# Patient Record
Sex: Male | Born: 1960
Health system: Southern US, Community
[De-identification: ages and names within clinical notes are randomized; demographics above are authoritative.]

## PROBLEM LIST (undated history)

## (undated) DIAGNOSIS — D369 Benign neoplasm, unspecified site: Secondary | ICD-10-CM

## (undated) DIAGNOSIS — M539 Dorsopathy, unspecified: Secondary | ICD-10-CM

## (undated) DIAGNOSIS — A048 Other specified bacterial intestinal infections: Secondary | ICD-10-CM

## (undated) DIAGNOSIS — Q600 Renal agenesis, unilateral: Secondary | ICD-10-CM

## (undated) DIAGNOSIS — K219 Gastro-esophageal reflux disease without esophagitis: Secondary | ICD-10-CM

## (undated) DIAGNOSIS — N529 Male erectile dysfunction, unspecified: Secondary | ICD-10-CM

## (undated) DIAGNOSIS — G4730 Sleep apnea, unspecified: Secondary | ICD-10-CM

## (undated) DIAGNOSIS — E7800 Pure hypercholesterolemia, unspecified: Secondary | ICD-10-CM

## (undated) DIAGNOSIS — N400 Enlarged prostate without lower urinary tract symptoms: Secondary | ICD-10-CM

## (undated) DIAGNOSIS — K5790 Diverticulosis of intestine, part unspecified, without perforation or abscess without bleeding: Secondary | ICD-10-CM

## (undated) DIAGNOSIS — M1990 Unspecified osteoarthritis, unspecified site: Secondary | ICD-10-CM

## (undated) DIAGNOSIS — F419 Anxiety disorder, unspecified: Secondary | ICD-10-CM

## (undated) DIAGNOSIS — R0600 Dyspnea, unspecified: Secondary | ICD-10-CM

## (undated) DIAGNOSIS — K851 Biliary acute pancreatitis without necrosis or infection: Secondary | ICD-10-CM

## (undated) DIAGNOSIS — E785 Hyperlipidemia, unspecified: Secondary | ICD-10-CM

## (undated) DIAGNOSIS — K869 Disease of pancreas, unspecified: Secondary | ICD-10-CM

## (undated) DIAGNOSIS — F32A Depression, unspecified: Secondary | ICD-10-CM

## (undated) DIAGNOSIS — J189 Pneumonia, unspecified organism: Secondary | ICD-10-CM

## (undated) DIAGNOSIS — Z8371 Family history of colonic polyps: Secondary | ICD-10-CM

## (undated) DIAGNOSIS — I10 Essential (primary) hypertension: Secondary | ICD-10-CM

## (undated) HISTORY — DX: Benign prostatic hyperplasia without lower urinary tract symptoms: N40.0

## (undated) HISTORY — DX: Other specified bacterial intestinal infections: A04.8

## (undated) HISTORY — DX: Renal agenesis, unilateral: Q60.0

## (undated) HISTORY — DX: Benign neoplasm, unspecified site: D36.9

## (undated) HISTORY — DX: Hyperlipidemia, unspecified: E78.5

## (undated) HISTORY — DX: Gastro-esophageal reflux disease without esophagitis: K21.9

## (undated) HISTORY — PX: KNEE SURGERY: SHX244

## (undated) HISTORY — DX: Dorsopathy, unspecified: M53.9

## (undated) HISTORY — DX: Disease of pancreas, unspecified: K86.9

## (undated) HISTORY — PX: BACK SURGERY: SHX140

## (undated) HISTORY — PX: PANCREAS SURGERY: SHX731

## (undated) HISTORY — PX: OTHER SURGICAL HISTORY: SHX169

## (undated) HISTORY — DX: Male erectile dysfunction, unspecified: N52.9

## (undated) HISTORY — PX: FOOT SURGERY: SHX648

## (undated) HISTORY — DX: Diverticulosis of intestine, part unspecified, without perforation or abscess without bleeding: K57.90

## (undated) HISTORY — PX: CARPAL TUNNEL RELEASE: SHX101

## (undated) HISTORY — DX: Biliary acute pancreatitis without necrosis or infection: K85.10

## (undated) HISTORY — PX: SPINE SURGERY: SHX786

---

## 1996-11-04 DIAGNOSIS — A048 Other specified bacterial intestinal infections: Secondary | ICD-10-CM

## 1996-11-04 HISTORY — DX: Other specified bacterial intestinal infections: A04.8

## 2000-08-25 ENCOUNTER — Ambulatory Visit (HOSPITAL_COMMUNITY): Admission: RE | Admit: 2000-08-25 | Discharge: 2000-08-25 | Payer: Self-pay | Admitting: Family Medicine

## 2000-08-25 ENCOUNTER — Encounter: Payer: Self-pay | Admitting: Family Medicine

## 2001-04-27 ENCOUNTER — Encounter: Payer: Self-pay | Admitting: Family Medicine

## 2001-04-27 ENCOUNTER — Ambulatory Visit (HOSPITAL_COMMUNITY): Admission: RE | Admit: 2001-04-27 | Discharge: 2001-04-27 | Payer: Self-pay | Admitting: Family Medicine

## 2001-04-29 ENCOUNTER — Encounter: Payer: Self-pay | Admitting: Family Medicine

## 2001-04-29 ENCOUNTER — Inpatient Hospital Stay (HOSPITAL_COMMUNITY): Admission: RE | Admit: 2001-04-29 | Discharge: 2001-05-03 | Payer: Self-pay | Admitting: Family Medicine

## 2001-05-02 ENCOUNTER — Encounter: Payer: Self-pay | Admitting: Internal Medicine

## 2001-05-13 ENCOUNTER — Ambulatory Visit (HOSPITAL_COMMUNITY): Admission: RE | Admit: 2001-05-13 | Discharge: 2001-05-13 | Payer: Self-pay | Admitting: Pulmonary Disease

## 2001-05-13 ENCOUNTER — Ambulatory Visit (HOSPITAL_COMMUNITY): Admission: RE | Admit: 2001-05-13 | Discharge: 2001-05-13 | Payer: Self-pay | Admitting: Family Medicine

## 2001-05-20 ENCOUNTER — Encounter: Payer: Self-pay | Admitting: General Surgery

## 2001-05-20 ENCOUNTER — Ambulatory Visit (HOSPITAL_COMMUNITY): Admission: RE | Admit: 2001-05-20 | Discharge: 2001-05-20 | Payer: Self-pay | Admitting: Pediatrics

## 2001-05-26 ENCOUNTER — Inpatient Hospital Stay (HOSPITAL_COMMUNITY): Admission: RE | Admit: 2001-05-26 | Discharge: 2001-05-31 | Payer: Self-pay | Admitting: General Surgery

## 2001-05-29 ENCOUNTER — Encounter: Payer: Self-pay | Admitting: General Surgery

## 2001-05-30 ENCOUNTER — Encounter: Payer: Self-pay | Admitting: General Surgery

## 2002-03-16 ENCOUNTER — Ambulatory Visit (HOSPITAL_COMMUNITY): Admission: RE | Admit: 2002-03-16 | Discharge: 2002-03-16 | Payer: Self-pay | Admitting: Internal Medicine

## 2002-03-16 ENCOUNTER — Encounter (INDEPENDENT_AMBULATORY_CARE_PROVIDER_SITE_OTHER): Payer: Self-pay | Admitting: Internal Medicine

## 2002-05-24 ENCOUNTER — Ambulatory Visit (HOSPITAL_COMMUNITY): Admission: RE | Admit: 2002-05-24 | Discharge: 2002-05-24 | Payer: Self-pay | Admitting: Pulmonary Disease

## 2003-01-01 ENCOUNTER — Encounter: Payer: Self-pay | Admitting: Family Medicine

## 2003-01-01 ENCOUNTER — Ambulatory Visit (HOSPITAL_COMMUNITY): Admission: RE | Admit: 2003-01-01 | Discharge: 2003-01-01 | Payer: Self-pay | Admitting: Family Medicine

## 2003-02-20 ENCOUNTER — Ambulatory Visit (HOSPITAL_COMMUNITY): Admission: RE | Admit: 2003-02-20 | Discharge: 2003-02-20 | Payer: Self-pay | Admitting: Family Medicine

## 2003-03-13 ENCOUNTER — Ambulatory Visit (HOSPITAL_COMMUNITY): Admission: RE | Admit: 2003-03-13 | Discharge: 2003-03-14 | Payer: Self-pay | Admitting: Neurosurgery

## 2003-05-16 ENCOUNTER — Encounter (HOSPITAL_COMMUNITY): Admission: RE | Admit: 2003-05-16 | Discharge: 2003-06-15 | Payer: Self-pay | Admitting: Neurosurgery

## 2003-06-14 ENCOUNTER — Encounter (HOSPITAL_COMMUNITY): Admission: RE | Admit: 2003-06-14 | Discharge: 2003-07-14 | Payer: Self-pay | Admitting: Neurosurgery

## 2003-07-16 ENCOUNTER — Ambulatory Visit (HOSPITAL_COMMUNITY): Admission: RE | Admit: 2003-07-16 | Discharge: 2003-07-16 | Payer: Self-pay | Admitting: Neurosurgery

## 2004-12-02 ENCOUNTER — Ambulatory Visit (HOSPITAL_COMMUNITY): Admission: RE | Admit: 2004-12-02 | Discharge: 2004-12-02 | Payer: Self-pay | Admitting: Family Medicine

## 2004-12-02 ENCOUNTER — Ambulatory Visit (HOSPITAL_COMMUNITY): Admission: RE | Admit: 2004-12-02 | Discharge: 2004-12-02 | Payer: Self-pay

## 2004-12-09 ENCOUNTER — Ambulatory Visit (HOSPITAL_COMMUNITY): Admission: RE | Admit: 2004-12-09 | Discharge: 2004-12-09 | Payer: Self-pay | Admitting: Family Medicine

## 2004-12-16 ENCOUNTER — Ambulatory Visit: Payer: Self-pay | Admitting: *Deleted

## 2004-12-26 ENCOUNTER — Encounter (HOSPITAL_COMMUNITY): Admission: RE | Admit: 2004-12-26 | Discharge: 2004-12-26 | Payer: Self-pay | Admitting: *Deleted

## 2004-12-26 ENCOUNTER — Ambulatory Visit: Payer: Self-pay | Admitting: Cardiology

## 2004-12-26 ENCOUNTER — Emergency Department (HOSPITAL_COMMUNITY): Admission: EM | Admit: 2004-12-26 | Discharge: 2004-12-26 | Payer: Self-pay | Admitting: Emergency Medicine

## 2004-12-31 ENCOUNTER — Ambulatory Visit: Payer: Self-pay | Admitting: *Deleted

## 2005-01-02 ENCOUNTER — Ambulatory Visit: Payer: Self-pay | Admitting: Critical Care Medicine

## 2005-01-30 ENCOUNTER — Ambulatory Visit: Payer: Self-pay | Admitting: Critical Care Medicine

## 2005-02-11 ENCOUNTER — Ambulatory Visit (HOSPITAL_COMMUNITY): Admission: RE | Admit: 2005-02-11 | Discharge: 2005-02-11 | Payer: Self-pay | Admitting: Family Medicine

## 2005-02-25 ENCOUNTER — Encounter: Admission: RE | Admit: 2005-02-25 | Discharge: 2005-02-25 | Payer: Self-pay | Admitting: Orthopedic Surgery

## 2005-03-02 ENCOUNTER — Ambulatory Visit (HOSPITAL_COMMUNITY): Admission: RE | Admit: 2005-03-02 | Discharge: 2005-03-02 | Payer: Self-pay | Admitting: Orthopedic Surgery

## 2005-03-02 ENCOUNTER — Ambulatory Visit (HOSPITAL_BASED_OUTPATIENT_CLINIC_OR_DEPARTMENT_OTHER): Admission: RE | Admit: 2005-03-02 | Discharge: 2005-03-02 | Payer: Self-pay | Admitting: Orthopedic Surgery

## 2005-03-11 ENCOUNTER — Encounter (HOSPITAL_COMMUNITY): Admission: RE | Admit: 2005-03-11 | Discharge: 2005-03-31 | Payer: Self-pay | Admitting: Orthopedic Surgery

## 2005-04-07 ENCOUNTER — Encounter (HOSPITAL_COMMUNITY): Admission: RE | Admit: 2005-04-07 | Discharge: 2005-05-07 | Payer: Self-pay | Admitting: Orthopedic Surgery

## 2005-08-18 ENCOUNTER — Ambulatory Visit (HOSPITAL_COMMUNITY): Admission: RE | Admit: 2005-08-18 | Discharge: 2005-08-18 | Payer: Self-pay | Admitting: Family Medicine

## 2005-08-21 ENCOUNTER — Ambulatory Visit (HOSPITAL_COMMUNITY): Admission: RE | Admit: 2005-08-21 | Discharge: 2005-08-21 | Payer: Self-pay | Admitting: Family Medicine

## 2005-08-27 ENCOUNTER — Ambulatory Visit (HOSPITAL_COMMUNITY): Admission: RE | Admit: 2005-08-27 | Discharge: 2005-08-27 | Payer: Self-pay | Admitting: Internal Medicine

## 2006-02-11 ENCOUNTER — Ambulatory Visit (HOSPITAL_COMMUNITY): Admission: RE | Admit: 2006-02-11 | Discharge: 2006-02-11 | Payer: Self-pay | Admitting: Family Medicine

## 2007-08-18 ENCOUNTER — Ambulatory Visit: Payer: Self-pay | Admitting: Internal Medicine

## 2007-08-31 ENCOUNTER — Ambulatory Visit: Payer: Self-pay | Admitting: Internal Medicine

## 2007-08-31 ENCOUNTER — Ambulatory Visit (HOSPITAL_COMMUNITY): Admission: RE | Admit: 2007-08-31 | Discharge: 2007-08-31 | Payer: Self-pay | Admitting: Internal Medicine

## 2007-08-31 HISTORY — PX: ESOPHAGOGASTRODUODENOSCOPY: SHX1529

## 2008-05-14 ENCOUNTER — Encounter: Admission: RE | Admit: 2008-05-14 | Discharge: 2008-05-14 | Payer: Self-pay | Admitting: Specialist

## 2008-06-13 ENCOUNTER — Encounter: Admission: RE | Admit: 2008-06-13 | Discharge: 2008-06-13 | Payer: Self-pay | Admitting: Specialist

## 2009-06-24 ENCOUNTER — Ambulatory Visit (HOSPITAL_COMMUNITY): Admission: RE | Admit: 2009-06-24 | Discharge: 2009-06-24 | Payer: Self-pay | Admitting: Orthopaedic Surgery
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
## 2010-08-19 NOTE — Op Note (Signed)
NAME:  Derek [name], Derek [name] NO.:  [id number]   MEDICAL RECORD NO.:  [id number]          PATIENT TYPE:  AMB   LOCATION:  DAY                           FACILITY:  [facility]   PHYSICIAN:  R. Roetta Sessions, M.D. DATE OF BIRTH:  May 05, 1960   DATE OF PROCEDURE:  08/31/2007  DATE OF DISCHARGE:                               OPERATIVE REPORT   INDICATIONS FOR PROCEDURE:  A 50 year old gentleman with longstanding  gastroesophageal reflux disease symptoms.  We started him on some  Aciphex 20 mg orally daily.  Recently, this has been associated with  marked pulmonary symptoms.  He has no odynophagia and no dysphagia.  He  has fairly deleterious lifestyle as far as predisposing him into  gastroesophageal reflux disease is concerned.  EGD is now being done.  This approach has been discussed with the patient at length.  Potential  risks, benefits, and alternatives have been reviewed and questions were  answered.  He is agreeable.  Please see the documentation in the medical  record.   PROCEDURE NOTE:  O2 saturation, blood pressure, pulse, and respirations  were monitored throughout the entire procedure.   CONSCIOUS SEDATION:  Versed 4 mg IV and Demerol 100 mg IV in divided  doses.   INSTRUMENT:  Pentax video chip system.   ANESTHESIA:  Cetacaine spray for topical pharyngeal anesthesia.   FINDINGS:  Examination of the tubular esophagus revealed geographic  distal esophageal erosions coming up 3 cm above the EG junction.  There  was no evidence of Barrett's esophagus or carcinoma.  The tubular  esophagus was widely patent and easily traversed with the scope.  Stomach:  Gastric cavity was emptied and insufflated well with air.  Thorough examination of the gastric mucosa including retroflexed view of  the proximal stomach esophagogastric junction demonstrated a small  hiatal hernia only.  Pylorus was patent and easily traversed.  Examination of the bulb and the second portion revealed  no  abnormalities.   THERAPEUTIC/DIAGNOSTIC MANEUVERS PERFORMED:  None.   The patient tolerated the procedure well and was reactive in the  Endoscopy.   IMPRESSION:  Geographic distal esophageal erosions consistent with  rather severe erosive reflux esophagitis, hiatal hernia, otherwise,  normal stomach, D1 and D2.   RECOMMENDATIONS:  1. Increase Aciphex 20 mg orally daily eat before breakfast and      supper.  2. He is to continue to curtail to smoke tobacco, alcohol, caffeine,      and carbonate beverages.  No late-night meals were emphasize.  3. Plan is to see this gentleman back in 1 month to see how he is      doing and go from there.      [name], M.D.  Electronically Signed     RMR/MEDQ  D:  08/31/2007  T:  09/01/2007  Job:  [id number]
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
## 2010-08-22 NOTE — Op Note (Signed)
[facility]  Patient:    Derek [name], Derek [name] Visit Number: [id number] MRN: [id number]          Service Type: OUT Location: RAD Attending Physician:  Barbaraann Barthel Dictated by:   Barbaraann Barthel, M.D. Proc. Date: 05/26/01 Admit Date:  05/20/2001 Discharge Date: 05/20/2001   CC:         Elpidio Anis, M.D.  Dorthey Sawyer, M.D.   Operative Report  PREOPERATIVE DIAGNOSIS:  Distal pancreatic mass.  POSTOPERATIVE DIAGNOSIS:  Distal pancreatic mass.  OPERATION:  Excision of distal pancreatic mass.  SURGEON:  Barbaraann Barthel, M.D.  ASSISTANT:  Elpidio Anis, M.D.  NOTE:  This is a 50 year old white male who was noted to have a mass on the tail of his pancreas when he was admitted with left lower lobe pneumonia several weeks ago.  This was an incidental finding on a CT scan.  Further analysis of CT scan did not reveal any other lesions.  Preoperatively, his CEA level was not elevated.  The mass was 3 to 4 cm on the distal portion of the pancreas.  It was anterior and inferior on the pancreas.  We discussed the need for excision of this lesion in detail with the patient. We discussed the complications of the surgery including bleeding, infection, pancreatic fistula formation, incidental splenectomy.  Informed consent was obtained.  GROSS OPERATIVE FINDINGS:  The patient was noted to have a 3 x 4 cm lesion that was on the anterior inferior aspect of the tail of the pancreas.  No other abnormalities were found intraoperatively.  The liver was without any findings, and no masses were palpated in the bowel.  This cystic lesion, when being removed, ruptured a chalky-like substance within it.  We sent the mass to pathology, and frozen section rendered a presumptive benign diagnosis of cystadenoma.  TECHNIQUE:  The patient was placed in the supine position.  After adequate administration of general anesthesia by endotracheal intubation, his entire abdomen  was prepped with Betadine solution and draped in the usual manner. Prior to this, a Foley catheter was aseptically inserted.  A left subcostal incision was carried out from the anterior axillary line to the mid line through skin and subcutaneous tissue, down through the rectus muscle, and the lateral external and internal oblique muscles laterally.  The peritoneum was opened, and the abdomen was explored with the above findings.  We then dissected free the omentum from its attachment to the transverse colon with careful dissection.  At times we used the LDS stapling device for vascular adhesions.  The  pancreas and the spleen were then exposed.  We used the self-retaining retractor with a halogen light source to assist [place].  The mass was palpated.  This was pedunculated.  We were able to remove this running a [employer] stapler across its base.  We checked for hemostasis and then irrigated the area in the left upper quadrant, and then I elected to leave a Jackson-Pratt drain in the left lower quadrant.  This was entered through a separate stab wound incision.  After checking for hemostasis and irrigating with normal saline solution, the peritoneum was closed with a running 0 Vicryl suture, and the fascial layers were closed interruptedly with figure-of-eight 0 Vicryl sutures.  The skin was approximated with a stapling device, and then a drain was sutured in place with a 3-0 nylon suture.  Prior to closure, all sponge, needle, and instrument counts were found to be correct.  Estimated blood loss was 200  to 250 cc.  The patient received 3200 cc crystalloid intraoperatively.  There were no complications.  The patient was taken to the recovery room in satisfactory condition. Dictated by:   Barbaraann Barthel, M.D. Attending Physician:  Barbaraann Barthel DD:  05/26/01 TD:  05/26/01 Job: [id number]

## 2010-08-22 NOTE — Discharge Summary (Signed)
[facility]  Patient:    Derek [name], Derek [name] Visit Number: [id number] MRN: [id number]          Service Type: MED Location: 3A A304 01 Attending Physician:  Darlin Priestly Dictated by:   Colette Ribas, M.D. Admit Date:  04/29/2001 Discharge Date: 05/03/2001                             Discharge Summary  DISCHARGE DIAGNOSES: 1. Left lower lobe pneumonia. 2. Pancreatic tail mass.  HISTORY OF PRESENT ILLNESS/PAST MEDICAL HISTORY:  Please see admission H&P.  HOSPITAL COURSE:  A 50 year old gentleman who failed outpatient treatment for pneumonia is admitted to the hospital for aggressive IV therapy and nebulized treatments.  He has a long history of three pack per day tobacco use.  No other significant medical history.  CT of the chest was done on admission to further delineate any changes in the chest which basically showed the left lower lobe pneumonia as prior known, but also showed a 3 cm pancreatic tail mass.  No other metastatic disease seen.  CEA has been negative.  Dr. Malvin Johns was consulted.  Both Dr. Malvin Johns, myself, and the radiologist feel that this is a benign lesion.  The patient continued to improve on a daily basis from aggressive nebulizer treatments of albuterol/Atrovent as well as IV Rocephin and Zithromycin. Patient remained afebrile during most of the hospital stay.  On May 02, 2001 the patient was transitioned to p.o. steroids and was ready for discharge.  On the day of discharge patient had good O2 saturations on room air 95% and above, felt quite well.  Less shortness of breath and less discomfort.  PHYSICAL EXAMINATION  VITAL SIGNS:  Tmax 98.1, pulse 70s, respirations 20, O2 saturations 95% and greater on room air, blood pressure normal.  GENERAL:  Pleasant, talkative gentleman when I saw him.  He looked quite a bit better.  CHEST:  Clear to auscultation bilaterally with no wheezes, rhonchi or rales, or  consolidation.  CARDIOVASCULAR:  Regular rate and rhythm.  Normal S1, S2.  No murmur, rub, or gallop.  ABDOMEN:  Bowel sounds positive, soft, nontender, nondistended.  DISCHARGE LABORATORIES:  Blood cultures negative x3 days.  Chest x-ray on January 27 showed improvement of the left lower lobe infiltrate.  DISCHARGE MEDICATIONS: 1. Augmentin XR b.i.d. for 10 days as prior prescribed. 2. Anaplex DM one teaspoon q.4-6h. p.r.n. 3. Combivent two puffs q.i.d. 4. Prednisone taper pack.  FOLLOWUP:  Myself in one week.  Follow up with Dr. Malvin Johns after that to set up for surgical removal of the pancreatic tail mass.  Any problems that arise in the meantime he is to call or return immediately.  CONDITION ON DISCHARGE:  Greatly improved. Dictated by:   Colette Ribas, M.D. Attending Physician:  Darlin Priestly DD:  05/03/01 TD:  05/03/01 Job: [id number]

## 2010-08-22 NOTE — Op Note (Signed)
NAME:  Derek [name], Derek [name] NO.:  [id number]   MEDICAL RECORD NO.:  [id number]          PATIENT TYPE:  AMB   LOCATION:  DSC                          FACILITY:  [facility]   PHYSICIAN:  Robert A. Thurston Hole, M.D. DATE OF BIRTH:  [birth year]   DATE OF PROCEDURE:  03/02/2005  DATE OF DISCHARGE:                                 OPERATIVE REPORT   PREOPERATIVE DIAGNOSES:  1.  Right knee medial and lateral meniscal tears.  2.  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   POSTOPERATIVE DIAGNOSES:  1.  Right knee medial and lateral meniscal tears.  2.  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   PROCEDURES:  1.  Right knee examination under anesthesia followed by arthroscopic partial      medial and lateral meniscectomies.  2.  Right knee chondroplasty.  3.  Right knee lateral release.  4.  Right knee synovectomy.   SURGEON:  Elana Alm. Thurston Hole, M.D.   ASSISTANT:  Julien Girt, P.A.   ANESTHESIA:  Local MAC.   OPERATIVE TIME:  30 minutes.   COMPLICATIONS:  None.   INDICATIONS FOR PROCEDURE:  Mr.  Casten is a 50 year old gentleman who  injured his right knee playing tennis approximately six weeks ago with  significant pain and swelling.  Exam and MRI has revealed a medial meniscal  tear and is now to undergo arthroscopy due to failed conservative care.   DESCRIPTION OF PROCEDURE:  Mr. Brumbaugh was brought to the operating room on  March 02, 2005, after a knee block had been placed in the holding room by  anesthesia.  He is placed on the operating table in the supine position.  His right knee was examined.  Range of motion from 0 to 125 degrees, 1 to 2+  crepitation.  Knee stable ligamentous exam with mild lateral patella  tracking.  The right leg was prepped using sterile DuraPrep and draped using  sterile technique.  Originally, through an anterior lateral portal, the  arthroscope with a pump attachment was  placed and through an anterior medial  portal, an arthroscopic probe was placed.  On initial inspection of medial  compartment, the articular cartilage was normal.  Medical meniscus showed a  tear of the posterior horn 40 to 50% was resected back to a stable rim.  The  medial and anterior horns were intact.  Intercondylar notch inspected.  Anterior and posterior cruciate ligaments were normal.  Lateral compartment  inspected, 25% grade III chondromalacia lateral tibial plateau which was  debrided.  The rest of the lateral compartment showed mild grade I and II  changes.  Lateral meniscus tear 20% posterior and lateral corner which was  resected back to a stable rim.  Patellofemoral joint showed a significant  50% grade III chondral defect with loose articular cartilage flaps on the  patella and this was debrided.  Femoral groove articular cartilage was  intact.  There was significant lateral patella tilt and tracking and thus a  lateral retinacular release was carried out with an ArthroCare wand  decompressing the patellofemoral joint and improving patella  tracking.  No  excessive bleeding was encountered.  Moderate synovitis in the medial and  lateral gutters was also debrided, otherwise they are free of pathology.  After this was done, it was felt that all pathology had been satisfactorily  addressed.  The instruments were removed.  Portals were closed with 3-0  nylon suture and injected with 0.25% Marcaine with epinephrine and 4 mg of  morphine and sterile dressings applied and the patient awakened and taken to  the recovery room in stable condition.   FOLLOW UP CARE:  Mr. Muldrew will be followed as an outpatient on Norco and  Naprosyn.  See him back in the office in a week for sutures out and follow-  up and will begin physical therapy at that time.      Robert A. Thurston Hole, M.D.  Electronically Signed     RAW/MEDQ  D:  03/02/2005  T:  03/02/2005  Job:  [id number]
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
## 2010-08-22 NOTE — Procedures (Signed)
NAME:  Derek [name], RAWL NO.:  [id number]   MEDICAL RECORD NO.:  [id number]          PATIENT TYPE:  REC   LOCATION:                                FACILITY:  [facility]   PHYSICIAN:  Vida Roller, M.D.   DATE OF BIRTH:  01-03-1961   DATE OF PROCEDURE:  12/26/2004  DATE OF DISCHARGE:                                    STRESS TEST   HISTORY OF PRESENT ILLNESS:  The patient is a 50 year old male with a  history of pneumonia and history of back surgery with complaint of dyspnea.   TEST PERFORMED:  Exercise Myoview performed by April Humphrey, N.P.   INDICATIONS FOR THE TEST:  Dyspnea.   SYMPTOMS DURING THE TEST:  Shortness of breath with no complaints of any  chest pain.  Dyspnea resolved upon completion of the test.  Arrhythmias  noted during the procedure were PVCs.  The reason for stopping the test was  target heart rate achieved.   RESULTS:  The patient was extremely short of breath during the procedure.  Resting heart rate was 43, resting blood pressure 110/60.  Target heart rate  149.  Maximum predicted heart rate 175.  Percent of maximum heart rate was  87.  Worse case ST slope was 4.  Worse case ST level was negative 0.7.  Maximum heart rate was 152.  Maximum blood pressure was 140/86.  Total  exercise time was 8 minutes and 51 seconds.  METs achieved was 10.1.   Final images pending cardiology review.     ______________________________  April Humphrey, NP      Vida Roller, M.D.  Electronically Signed    AH/MEDQ  D:  12/26/2004  T:  12/26/2004  Job:  [id number]

## 2010-08-22 NOTE — Op Note (Signed)
NAME:  Derek [name], Derek [name]                         ACCOUNT NO.:  [id number]   MEDICAL RECORD NO.:  [id number]                   PATIENT TYPE:  OIB   LOCATION:  3012                                 FACILITY:  [facility]   PHYSICIAN:  Danae Orleans. Venetia Maxon, M.D.               DATE OF BIRTH:  02/28/1961   DATE OF PROCEDURE:  03/13/2003  DATE OF DISCHARGE:  03/14/2003                                 OPERATIVE REPORT   PREOPERATIVE DIAGNOSIS:  Herniated cervical disk C5-6 with cervical  spondylosis, degenerative disk disease and cervical radiculopathy.   POSTOPERATIVE DIAGNOSIS:  Herniated cervical disk C5-6 with cervical  spondylosis, degenerative disk disease and cervical radiculopathy.   OPERATION PERFORMED:  Anterior cervical decompression and fusion C5-6 level  with allograft, bone graft and anterior cervical plate.   SURGEON:  Danae Orleans. Venetia Maxon, M.D.   ASSISTANT:  Clydene Fake, M.D.   ANESTHESIA:  General endotracheal.   ESTIMATED BLOOD LOSS:  Minimal.   COMPLICATIONS:  None.   DISPOSITION:  Recovery.   INDICATIONS FOR PROCEDURE:  Derek [name] is a 50 year old man who had  previously undergone anterior cervical decompression and fusion at C6-7  level who now has significant spondylosis and cervical disk herniation at C5-  6 level.  It was elected to take him to surgery for anterior cervical  decompression and fusion at this affected level.   DESCRIPTION OF PROCEDURE:  Derek [name] was brought to the operating room.  Following satisfactory and uncomplicated induction of general endotracheal  anesthesia plus intravenous lines, the patient was placed in supine position  on the operating table.  His neck was placed in slight extension and he was  placed in 10 pounds of halter traction.  The anterior neck was then prepped  and draped in the usual sterile fashion.  The area of planned incision was  infiltrated with 0.25% Marcaine and 0.5% lidocaine 1:200,000 epinephrine.  Incision was made on  the right side of the neck because this was the side of  his prior surgical approach and the incision was carried sharply through the  platysma layer.  Subplatysmal dissection was performed exposing the anterior  border of the sternocleidomastoid muscle. Using blunt dissection, the  carotid sheath was kept lateral, the trachea and esophagus kept medial and  the anterior cervical spine was exposed.  A spondylitic ridge which was felt  to correspond to the C5-6 level was identified and a bent spinal needle was  placed and intraoperative x-ray confirmed this to be at the C5-6 level.  Subsequently, the longus colli muscles were taken down from the anterior  cervical spine from C5 through C6 bilaterally using electrocautery and Key  elevator.  Self-retaining Shadowline retractor was placed to facilitate  exposure.  The interspace was then incised with a 15 blade and disk material  was removed in piecemeal fashion.  Using the disk space spreaders, the disk  space was further evacuated  of residual disk material using Carlens  microcurets and pituitary rongeurs.  The microscope was brought into the  field and using microscopic visualization an A2 equivalent bur, the end  plates of C6 and C7 were decorticated and uncinate spurs drilled down.  The  posterior longitudinal ligament was removed in piecemeal fashion and the  spinal cord dura and C6 nerve roots were widely decompressed.  Hemostasis  was obtained with Gelfoam soaked in Thrombin.  An 8 mm corticocancellous  bone graft was hydrated after using trial sizer and placed into the  interspace and countersunk appropriately.  A 24 mm Trinica anterior cervical  plate was then affixed to the anterior cervical spine using 14 mm variable  angle screws, two at C5, two at C6.  All screws had excellent purchase.  Locking mechanisms were engaged.  Final X-ray confirmed position of bone  graft and anterior cervical plate.  Prior to fixing the plate, the  halter  traction was removed.  The wound was then copiously irrigated with  bacitracin saline.  Soft tissues were inspected and found to be in good  repair.  The platysma layer was closed with 3-0 Vicryl sutures and skin  edges were reapproximated with running 4-0 Vicryl subcuticular stitch.  The  wound was dressed with Dermabond.  The patient was extubated in the  operating room and taken to the recovery room in stable and satisfactory  condition having tolerated the operation well.  Counts were correct at the  end of the case.                                               Danae Orleans. Venetia Maxon, M.D.    JDS/MEDQ  D:  03/13/2003  T:  03/14/2003  Job:  [id number]

## 2010-08-22 NOTE — Procedures (Signed)
[facility]  Patient:    VASCO, CHONG Visit Number: [id number] MRN: [id number]          Service Type: OUT Location: RAD Attending Physician:  Fredirick Maudlin Dictated by:   Kari Baars, M.D. Admit Date:  05/13/2001 Discharge Date: 05/13/2001                      Pulmonary Function Test Inter.  IMPRESSION: 1. Spirometry shows mild-to-moderate ventilatory defect without evidence of    air flow obstruction. 2. Lung volumes show moderate restrictive change. 3. DLCO normal. 4. Arterial blood gases normal. Dictated by:   Kari Baars, M.D. Attending Physician:  Fredirick Maudlin DD:  05/16/01 TD:  05/17/01 Job: [id number]

## 2010-08-22 NOTE — Procedures (Signed)
NAME:  Derek [name], Derek [name] NO.:  [id number]   MEDICAL RECORD NO.:  [id number]          PATIENT TYPE:  OUT   LOCATION:  RAD                           FACILITY:  [facility]   PHYSICIAN:  Dani Gobble, MD       DATE OF BIRTH:  June 09, 1960   DATE OF PROCEDURE:  12/02/2004  DATE OF DISCHARGE:                                  ECHOCARDIOGRAM   REFERRING:  Dr. Phillips Odor and Dr. Malvin Johns   INDICATION:  [name] is a 50 year old gentleman with a past medical  history of smoking and a family history of CAD who has been experiencing  shortness of breath and dyspnea on exertion.   The technical quality of the study is somewhat limited secondary to patient  body habitus and poor acoustic windows.   The aorta is within normal limits measured at 3.3 cm.   The left atrium is also within normal limits measured at 3.3 cm. The patient  appeared to be in sinus rhythm during this procedure.   The intraventricular septum and posterior wall are borderline thickened.   The aortic valve was not well visualized but appeared to be grossly  structurally normal. No significant aortic insufficiency is noted. Doppler  interrogation of the aortic valve suggested minimal aortic sclerosis without  hemodynamically significant stenosis.   The mitral valve was notable for mild mitral annular calcification. No  mitral valve prolapse is noted. Trivial mitral regurgitation was noted.  Doppler interrogation of the mitral valve was within normal limits.   The pulmonic valve was not visualized.   The tricuspid valve also was poorly visualized. Mild tricuspid regurgitation  was noted.   The left ventricle is normal in size with the LVIDD measured at 5.3 cm and  the LVISD measured at 3.4 cm. Overall left ventricular systolic function was  normal and no regional wall motion abnormalities were noted.   The right atrium was not well visualized but appeared to be grossly normal  in size. The right ventricle was  moderately dilated with normal right  ventricular systolic function.   IMPRESSION:  1.  Borderline concentric left ventricular hypertrophy.  2.  Minimal aortic sclerosis without hemodynamically significant stenosis.  3.  Trivial mitral and mild tricuspid regurgitation.  4.  Normal left ventricular size and systolic function without regional wall      motion abnormality noted.  5.  Moderate right ventricular enlargement with normal right ventricular      systolic function.           ______________________________  Dani Gobble, MD     AB/MEDQ  D:  12/02/2004  T:  12/02/2004  Job:  [id number]   cc:   Corrie Mckusick, M.D.  Fax: [phone]

## 2010-08-22 NOTE — Discharge Summary (Signed)
[facility]  Patient:    Derek [name], Derek [name]. Visit Number: [id number] MRN: [id number]          Service Type: Attending:  Barbaraann Barthel, M.D. Dictated by:   Barbaraann Barthel, M.D. Adm. Date:  05/26/01 Disc. Date: 05/31/01   CC:         Colette Ribas, M.D.   Discharge Summary  DIAGNOSIS:  Cystadenoma of the tail of the pancreas, benign.  PROCEDURE:  A distal pancreatectomy and nucleation of cystic mass, tail of pancreas performed on May 26, 2001.  NOTE:  This is a 50 year old white male who was noted to have a 4 x 3 cm mass on the tail of his pancreas by CT scan when he was admitted previously to Dr. Beatrice Lecher service for treatment of a left lower lobe pneumonia. After the pneumonia had subsided, 6-8 weeks post treatment, he was readmitted via the outpatient department for planned excision of pancreatic mass.  HOSPITAL COURSE:  This was carried out on May 26, 2001 at which time a distal pancreatectomy was performed and a nucleation of a mass on the distal tail of his pancreas which was found to be benign, this cystic mass. This was performed with a stapling device without complications. There were no problems with splenectomy or fistula, and in the postoperative period he did quite well. He did require a PCA pump for pain control, other than that he did fine, developing no postoperative pancreatitis. He had some fluid that I felt might possibly be a pancreatic fistula; however, this was not, it was simply serous drainage from where I had placed a Jackson-Pratt drain. Otherwise, his postoperative period was completely benign, and he regained his bowel function, and his pain was under control. He was voiding well, without leg pain, or shortness of breath, and doing well at the time of discharge. He was discharged on May 31, 2001.  LABORATORY DATA:  Pathology showed benign cystic disease of the pancreas. The rest of his laboratory  work on May 30, 2001 showed his white count 6.3 with an H&H of 10.9 and 31.4. Just prior to his discharge, on May 31, 2001, his white count was 6.4 with an H&H of 11.2 and 31.9. Metabolic-7 was grossly within normal limits on May 29, 2001, and his amylase as I recall was not elevated, I do not see it on the chart at the time of this dictation.  The CT scan had revealed, as stated above, a 4 x 3 cm mass in the tail of the pancreas.  FOLLOWUP:  The patient did well postoperatively and we will follow with him perioperatively in my office and after which he is to return to Dr. Phillips Odor for any medical problems he may have in the future. Dictated by:   Barbaraann Barthel, M.D. Attending:  Barbaraann Barthel, M.D. DD:  07/19/01 TD:  07/19/01 Job: [id number]

## 2010-09-30 ENCOUNTER — Ambulatory Visit (INDEPENDENT_AMBULATORY_CARE_PROVIDER_SITE_OTHER): Payer: Self-pay | Admitting: Internal Medicine

## 2011-03-16 ENCOUNTER — Encounter: Payer: Self-pay | Admitting: Internal Medicine

## 2011-03-17 ENCOUNTER — Ambulatory Visit (INDEPENDENT_AMBULATORY_CARE_PROVIDER_SITE_OTHER): Payer: Medicare Other | Admitting: Gastroenterology

## 2011-03-17 ENCOUNTER — Encounter: Payer: Self-pay | Admitting: Gastroenterology

## 2011-03-17 DIAGNOSIS — Z1211 Encounter for screening for malignant neoplasm of colon: Secondary | ICD-10-CM | POA: Insufficient documentation

## 2011-03-17 DIAGNOSIS — Z83719 Family history of colon polyps, unspecified: Secondary | ICD-10-CM

## 2011-03-17 DIAGNOSIS — Z8371 Family history of colonic polyps: Secondary | ICD-10-CM

## 2011-03-17 DIAGNOSIS — R198 Other specified symptoms and signs involving the digestive system and abdomen: Secondary | ICD-10-CM | POA: Insufficient documentation

## 2011-03-17 DIAGNOSIS — K5900 Constipation, unspecified: Secondary | ICD-10-CM

## 2011-03-17 HISTORY — DX: Family history of colon polyps, unspecified: Z83.719

## 2011-03-17 HISTORY — DX: Family history of colonic polyps: Z83.71

## 2011-03-17 NOTE — Progress Notes (Signed)
Cc to PCP 

## 2011-03-17 NOTE — Patient Instructions (Signed)
Continue miralax daily as needed for constipation.  You may consider stopping stool softner and just continue fiber supplement. Drink adequate noncaffeinated beverages daily.  We have scheduled you for a colonoscopy, please see separate instructions.
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
## 2011-03-17 NOTE — Assessment & Plan Note (Signed)
Increased constipation in the setting of narcotic use over the past couple of years, multiple back surgeries. He has been off narcotics now for a while but continues to have issues with constipation. Improved on MiraLax, fiber, stool softeners. He has never had a screening colonoscopy. Tells me that his brother was found to have adenomatous colon polyps recently at age 52. Recommend colonoscopy at this time.  I have discussed the risks, alternatives, benefits with regards to but not limited to the risk of reaction to medication, bleeding, infection, perforation and the patient is agreeable to proceed. Written consent to be obtained.   He will continue MiraLax daily as needed. Continue fiber supplements, goal of 3-4 g daily. Discontinue Colace. Drink adequate water daily.

## 2011-03-20 ENCOUNTER — Encounter (HOSPITAL_COMMUNITY): Payer: Self-pay | Admitting: Pharmacy Technician

## 2011-03-24 MED ORDER — SODIUM CHLORIDE 0.45 % IV SOLN
Freq: Once | INTRAVENOUS | Status: AC
  Administered 2011-03-25: 08:00:00 via INTRAVENOUS

## 2011-03-25 ENCOUNTER — Ambulatory Visit (HOSPITAL_COMMUNITY)
Admission: RE | Admit: 2011-03-25 | Discharge: 2011-03-25 | Disposition: A | Discharge status: Home / Self Care | Payer: BC Managed Care – PPO | Source: Ambulatory Visit | Attending: Internal Medicine | Admitting: Internal Medicine

## 2011-03-25 ENCOUNTER — Encounter (HOSPITAL_COMMUNITY): Payer: Self-pay

## 2011-03-25 ENCOUNTER — Other Ambulatory Visit: Payer: Self-pay | Admitting: Internal Medicine

## 2011-03-25 ENCOUNTER — Encounter (HOSPITAL_COMMUNITY)
Admission: RE | Disposition: A | Discharge status: Home / Self Care | Payer: Self-pay | Source: Ambulatory Visit | Attending: Internal Medicine

## 2011-03-25 DIAGNOSIS — Z1211 Encounter for screening for malignant neoplasm of colon: Secondary | ICD-10-CM | POA: Insufficient documentation

## 2011-03-25 DIAGNOSIS — D128 Benign neoplasm of rectum: Secondary | ICD-10-CM

## 2011-03-25 DIAGNOSIS — K5730 Diverticulosis of large intestine without perforation or abscess without bleeding: Secondary | ICD-10-CM

## 2011-03-25 DIAGNOSIS — Z8371 Family history of colonic polyps: Secondary | ICD-10-CM

## 2011-03-25 DIAGNOSIS — Z7982 Long term (current) use of aspirin: Secondary | ICD-10-CM | POA: Insufficient documentation

## 2011-03-25 DIAGNOSIS — E785 Hyperlipidemia, unspecified: Secondary | ICD-10-CM | POA: Insufficient documentation

## 2011-03-25 DIAGNOSIS — D129 Benign neoplasm of anus and anal canal: Secondary | ICD-10-CM

## 2011-03-25 HISTORY — PX: COLONOSCOPY: SHX5424

## 2011-03-25 HISTORY — DX: Unspecified osteoarthritis, unspecified site: M19.90

## 2011-03-25 HISTORY — DX: Pure hypercholesterolemia, unspecified: E78.00

## 2011-03-25 SURGERY — COLONOSCOPY
Anesthesia: Moderate Sedation

## 2011-03-25 MED ORDER — STERILE WATER FOR IRRIGATION IR SOLN
Status: DC | PRN
  Administered 2011-03-25: 09:00:00

## 2011-03-25 MED ORDER — MIDAZOLAM HCL 5 MG/5ML IJ SOLN
INTRAMUSCULAR | Status: DC | PRN
  Administered 2011-03-25: 2 mg via INTRAVENOUS
  Administered 2011-03-25 (×2): 1 mg via INTRAVENOUS

## 2011-03-25 MED ORDER — MEPERIDINE HCL 100 MG/ML IJ SOLN
INTRAMUSCULAR | Status: DC | PRN
  Administered 2011-03-25: 25 mg via INTRAVENOUS
  Administered 2011-03-25: 50 mg via INTRAVENOUS

## 2011-03-25 MED ORDER — MEPERIDINE HCL 100 MG/ML IJ SOLN
INTRAMUSCULAR | Status: AC
  Filled 2011-03-25: qty 2

## 2011-03-25 MED ORDER — MIDAZOLAM HCL 5 MG/5ML IJ SOLN
INTRAMUSCULAR | Status: AC
  Filled 2011-03-25: qty 10

## 2011-03-25 NOTE — Op Note (Signed)
[facility] [address] [place], [place]  [address]  COLONOSCOPY PROCEDURE REPORT  PATIENT:  Demonie, Kassa  MR#:  [id number] BIRTHDATE:  09-17-1960, 50 yrs. old  GENDER:  male ENDOSCOPIST:  R. Roetta Sessions, MD FACP [facility] REF. BY:          Dr. Yetta Numbers PROCEDURE DATE:  03/25/2011 PROCEDURE:   colonoscopy with snare polypectomy  INDICATIONS:   average risk colon cancer screening  INFORMED CONSENT:  The risks, benefits, alternatives and imponderables including but not limited to bleeding, perforation as well as the possibility of a missed lesion have been reviewed. The potential for biopsy, lesion removal, etc. have also been discussed.  Questions have been answered.  All parties agreeable. Please see the history and physical in the medical record for more information.  MEDICATIONS:   Versed 4 mg IV and Demerol 75 mg IV in divided doses.  DESCRIPTION OF PROCEDURE:  After a digital rectal exam was performed, the EC-[id number] ([id number]) colonoscope was advanced from the anus through the rectum and colon to the area of the cecum, ileocecal valve and appendiceal orifice.  The cecum was deeply intubated.  These structures were well-seen and photographed for the record.  From the level of the cecum and ileocecal valve, the scope was slowly and cautiously withdrawn.  The mucosal surfaces were carefully surveyed utilizing scope tip deflection to facilitate fold flattening as needed.  The scope was pulled down into the rectum where a thorough examination including retroflexion was performed. <<PROCEDUREIMAGES>>  FINDINGS: suboptimal preparation. Somewhat long redundant colon. Sigmoid diverticula. single 5 mm pedunculated polyp and at 6 cm from the anal verge otherwise normal appearing rectal and colonic mucosa.  THERAPEUTIC / DIAGNOSTIC MANEUVERS PERFORMED:    The single rectal polyp was removed with hot snare cautery and recovered for the pathologist.  COMPLICATIONS:    none  CECAL WITHDRAWAL TIME: : 9 minutes  IMPRESSION:   Rectal polyp. Sigmoid diverticulosis. Long redundant colon.  RECOMMENDATIONS:    Daily fiber supplement. Followup on Pathology. MiraLax or generic equivalent  nightly as needed for constipation  ______________________________ R. Roetta Sessions, MD Caleen Essex  CC:  Karleen Hampshire, MD  n. eSIGNED:   R. Roetta Sessions at 03/25/2011 09:01 AM  Pamalee Leyden, [phone]

## 2011-03-25 NOTE — H&P (Signed)
  I have seen & examined the patient prior to the procedure(s) today and reviewed the history and physical/consultation.  There have been no changes.  After consideration of the risks, benefits, alternatives and imponderables, the patient has consented to the procedure(s).   

## 2011-04-09 ENCOUNTER — Encounter (HOSPITAL_COMMUNITY): Payer: Self-pay | Admitting: Internal Medicine

## 2011-06-12 DIAGNOSIS — R05 Cough: Secondary | ICD-10-CM | POA: Diagnosis not present

## 2011-06-12 DIAGNOSIS — Z6831 Body mass index (BMI) 31.0-31.9, adult: Secondary | ICD-10-CM | POA: Diagnosis not present

## 2011-06-12 DIAGNOSIS — J069 Acute upper respiratory infection, unspecified: Secondary | ICD-10-CM | POA: Diagnosis not present

## 2011-10-02 DIAGNOSIS — H5200 Hypermetropia, unspecified eye: Secondary | ICD-10-CM | POA: Diagnosis not present

## 2011-10-02 DIAGNOSIS — H52229 Regular astigmatism, unspecified eye: Secondary | ICD-10-CM | POA: Diagnosis not present

## 2011-10-02 DIAGNOSIS — H524 Presbyopia: Secondary | ICD-10-CM | POA: Diagnosis not present

## 2011-12-21 ENCOUNTER — Other Ambulatory Visit: Payer: Self-pay | Admitting: Orthopaedic Surgery

## 2011-12-21 DIAGNOSIS — M545 Low back pain: Secondary | ICD-10-CM

## 2011-12-25 ENCOUNTER — Ambulatory Visit
Admission: RE | Admit: 2011-12-25 | Discharge: 2011-12-25 | Disposition: A | Discharge status: Home / Self Care | Payer: Worker's Compensation | Source: Ambulatory Visit | Attending: Orthopaedic Surgery | Admitting: Orthopaedic Surgery

## 2011-12-25 VITALS — BP 121/73 | HR 64

## 2011-12-25 DIAGNOSIS — M545 Low back pain: Secondary | ICD-10-CM

## 2011-12-25 MED ORDER — IOHEXOL 180 MG/ML  SOLN
15.0000 mL | Freq: Once | INTRAMUSCULAR | Status: AC | PRN
  Administered 2011-12-25: 15 mL via INTRATHECAL

## 2011-12-25 MED ORDER — ACETAMINOPHEN 500 MG PO TABS
1000.0000 mg | ORAL_TABLET | Freq: Once | ORAL | Status: AC
  Administered 2011-12-25: 1000 mg via ORAL

## 2011-12-25 MED ORDER — DIAZEPAM 5 MG PO TABS
10.0000 mg | ORAL_TABLET | Freq: Once | ORAL | Status: AC
  Administered 2011-12-25: 10 mg via ORAL

## 2012-04-20 ENCOUNTER — Encounter (HOSPITAL_COMMUNITY): Payer: Self-pay

## 2012-04-20 ENCOUNTER — Emergency Department (HOSPITAL_COMMUNITY): Payer: BC Managed Care – PPO

## 2012-04-20 ENCOUNTER — Inpatient Hospital Stay (HOSPITAL_COMMUNITY)
Admission: EM | Admit: 2012-04-20 | Discharge: 2012-04-22 | DRG: 204 | Disposition: A | Discharge status: Home / Self Care | Payer: BC Managed Care – PPO | Attending: Internal Medicine | Admitting: Internal Medicine

## 2012-04-20 DIAGNOSIS — R51 Headache: Secondary | ICD-10-CM | POA: Diagnosis present

## 2012-04-20 DIAGNOSIS — M129 Arthropathy, unspecified: Secondary | ICD-10-CM | POA: Diagnosis present

## 2012-04-20 DIAGNOSIS — K5900 Constipation, unspecified: Secondary | ICD-10-CM | POA: Diagnosis present

## 2012-04-20 DIAGNOSIS — E785 Hyperlipidemia, unspecified: Secondary | ICD-10-CM | POA: Diagnosis present

## 2012-04-20 DIAGNOSIS — Z87891 Personal history of nicotine dependence: Secondary | ICD-10-CM | POA: Diagnosis not present

## 2012-04-20 DIAGNOSIS — Z79899 Other long term (current) drug therapy: Secondary | ICD-10-CM

## 2012-04-20 DIAGNOSIS — T888XXA Other specified complications of surgical and medical care, not elsewhere classified, initial encounter: Secondary | ICD-10-CM

## 2012-04-20 DIAGNOSIS — M47817 Spondylosis without myelopathy or radiculopathy, lumbosacral region: Secondary | ICD-10-CM | POA: Diagnosis present

## 2012-04-20 DIAGNOSIS — K859 Acute pancreatitis without necrosis or infection, unspecified: Principal | ICD-10-CM | POA: Diagnosis present

## 2012-04-20 DIAGNOSIS — K8020 Calculus of gallbladder without cholecystitis without obstruction: Secondary | ICD-10-CM | POA: Diagnosis not present

## 2012-04-20 DIAGNOSIS — Q605 Renal hypoplasia, unspecified: Secondary | ICD-10-CM

## 2012-04-20 DIAGNOSIS — Z7982 Long term (current) use of aspirin: Secondary | ICD-10-CM

## 2012-04-20 DIAGNOSIS — K219 Gastro-esophageal reflux disease without esophagitis: Secondary | ICD-10-CM | POA: Diagnosis not present

## 2012-04-20 DIAGNOSIS — K810 Acute cholecystitis: Secondary | ICD-10-CM

## 2012-04-20 DIAGNOSIS — F411 Generalized anxiety disorder: Secondary | ICD-10-CM | POA: Diagnosis present

## 2012-04-20 DIAGNOSIS — M47816 Spondylosis without myelopathy or radiculopathy, lumbar region: Secondary | ICD-10-CM | POA: Insufficient documentation

## 2012-04-20 DIAGNOSIS — M5137 Other intervertebral disc degeneration, lumbosacral region: Secondary | ICD-10-CM | POA: Diagnosis not present

## 2012-04-20 DIAGNOSIS — K5730 Diverticulosis of large intestine without perforation or abscess without bleeding: Secondary | ICD-10-CM | POA: Diagnosis not present

## 2012-04-20 DIAGNOSIS — IMO0002 Reserved for concepts with insufficient information to code with codable children: Secondary | ICD-10-CM | POA: Diagnosis not present

## 2012-04-20 DIAGNOSIS — Q602 Renal agenesis, unspecified: Secondary | ICD-10-CM | POA: Diagnosis not present

## 2012-04-20 DIAGNOSIS — R1013 Epigastric pain: Secondary | ICD-10-CM | POA: Diagnosis not present

## 2012-04-20 DIAGNOSIS — M5136 Other intervertebral disc degeneration, lumbar region: Secondary | ICD-10-CM

## 2012-04-20 DIAGNOSIS — E781 Pure hyperglyceridemia: Secondary | ICD-10-CM

## 2012-04-20 DIAGNOSIS — K449 Diaphragmatic hernia without obstruction or gangrene: Secondary | ICD-10-CM | POA: Diagnosis not present

## 2012-04-20 DIAGNOSIS — J9819 Other pulmonary collapse: Secondary | ICD-10-CM | POA: Diagnosis not present

## 2012-04-20 HISTORY — DX: Family history of colonic polyps: Z83.71

## 2012-04-20 LAB — COMPREHENSIVE METABOLIC PANEL
Albumin: 4.5 g/dL (ref 3.5–5.2)
Alkaline Phosphatase: 114 U/L (ref 39–117)
BUN: 12 mg/dL (ref 6–23)
CO2: 32 mEq/L (ref 19–32)
Chloride: 100 mEq/L (ref 96–112)
GFR calc Af Amer: >90 mL/min (ref >90)
Glucose, Bld: 144 mg/dL — ABNORMAL HIGH (ref 70–99)
Potassium: 3.9 mEq/L (ref 3.5–5.1)
Total Bilirubin: 1.4 mg/dL — ABNORMAL HIGH (ref 0.3–1.2)

## 2012-04-20 LAB — CBC WITH DIFFERENTIAL/PLATELET
Basophils Absolute: 0 10*3/uL (ref 0.0–0.1)
Basophils Relative: 0 % (ref 0–1)
HCT: 43.4 % (ref 39.0–52.0)
MCHC: 32.7 g/dL (ref 30.0–36.0)
Monocytes Absolute: 1.2 10*3/uL — ABNORMAL HIGH (ref 0.1–1.0)
Neutro Abs: 7.8 10*3/uL — ABNORMAL HIGH (ref 1.7–7.7)
RDW: 12.6 % (ref 11.5–15.5)

## 2012-04-20 LAB — LIPASE, BLOOD: Lipase: >3000 U/L — ABNORMAL HIGH (ref 11–59)

## 2012-04-20 LAB — URINALYSIS, ROUTINE W REFLEX MICROSCOPIC
Bilirubin Urine: NEGATIVE
Ketones, ur: NEGATIVE mg/dL
Nitrite: NEGATIVE
Protein, ur: NEGATIVE mg/dL
Specific Gravity, Urine: 1.02 (ref 1.005–1.030)
Urobilinogen, UA: 0.2 mg/dL (ref 0.0–1.0)

## 2012-04-20 MED ORDER — HYDROMORPHONE HCL PF 1 MG/ML IJ SOLN
1.0000 mg | INTRAMUSCULAR | Status: AC
  Administered 2012-04-20: 1 mg via INTRAVENOUS
  Filled 2012-04-20: qty 1

## 2012-04-20 MED ORDER — PROMETHAZINE HCL 25 MG/ML IJ SOLN
12.5000 mg | Freq: Four times a day (QID) | INTRAMUSCULAR | Status: DC | PRN
  Administered 2012-04-21 – 2012-04-22 (×3): 12.5 mg via INTRAVENOUS
  Administered 2012-04-22: 02:00:00 via INTRAVENOUS
  Filled 2012-04-20 (×4): qty 1

## 2012-04-20 MED ORDER — HYDROMORPHONE HCL PF 1 MG/ML IJ SOLN
1.0000 mg | Freq: Once | INTRAMUSCULAR | Status: AC
  Administered 2012-04-20: 1 mg via INTRAVENOUS
  Filled 2012-04-20: qty 1

## 2012-04-20 MED ORDER — ONDANSETRON HCL 4 MG/2ML IJ SOLN
4.0000 mg | Freq: Three times a day (TID) | INTRAMUSCULAR | Status: AC | PRN
  Administered 2012-04-20: 4 mg via INTRAVENOUS
  Filled 2012-04-20: qty 2

## 2012-04-20 MED ORDER — SODIUM CHLORIDE 0.9 % IV SOLN: INTRAVENOUS | Status: DC

## 2012-04-20 MED ORDER — SODIUM CHLORIDE 0.9 % IV BOLUS (SEPSIS)
250.0000 mL | Freq: Once | INTRAVENOUS | Status: AC
  Administered 2012-04-20: 250 mL via INTRAVENOUS

## 2012-04-20 MED ORDER — IOHEXOL 300 MG/ML  SOLN
50.0000 mL | Freq: Once | INTRAMUSCULAR | Status: AC | PRN
  Administered 2012-04-20: 50 mL via ORAL

## 2012-04-20 MED ORDER — HYDROMORPHONE HCL PF 1 MG/ML IJ SOLN
1.0000 mg | INTRAMUSCULAR | Status: AC | PRN
  Administered 2012-04-21: 1 mg via INTRAVENOUS
  Filled 2012-04-20: qty 1

## 2012-04-20 MED ORDER — IOHEXOL 300 MG/ML  SOLN
100.0000 mL | Freq: Once | INTRAMUSCULAR | Status: AC | PRN
  Administered 2012-04-20: 100 mL via INTRAVENOUS

## 2012-04-20 MED ORDER — ONDANSETRON HCL 4 MG/2ML IJ SOLN
4.0000 mg | Freq: Once | INTRAMUSCULAR | Status: AC
  Administered 2012-04-20: 4 mg via INTRAVENOUS
  Filled 2012-04-20: qty 2

## 2012-04-20 MED ORDER — SODIUM CHLORIDE 0.9 % IV BOLUS (SEPSIS)
1000.0000 mL | Freq: Once | INTRAVENOUS | Status: AC
  Administered 2012-04-20: 1000 mL via INTRAVENOUS

## 2012-04-20 MED ORDER — PANTOPRAZOLE SODIUM 40 MG IV SOLR
40.0000 mg | Freq: Once | INTRAVENOUS | Status: AC
  Administered 2012-04-20: 40 mg via INTRAVENOUS
  Filled 2012-04-20: qty 40

## 2012-04-20 MED ORDER — SODIUM CHLORIDE 0.9 % IV SOLN
INTRAVENOUS | Status: AC
  Administered 2012-04-20: 20:00:00 via INTRAVENOUS

## 2012-04-20 MED ORDER — SODIUM CHLORIDE 0.9 % IV SOLN
INTRAVENOUS | Status: DC
  Administered 2012-04-20 – 2012-04-22 (×5): via INTRAVENOUS

## 2012-04-20 MED ORDER — LORAZEPAM 2 MG/ML IJ SOLN
0.5000 mg | INTRAMUSCULAR | Status: DC | PRN
  Administered 2012-04-20 – 2012-04-22 (×2): 0.5 mg via INTRAVENOUS
  Filled 2012-04-20 (×2): qty 1

## 2012-04-20 MED ORDER — PANTOPRAZOLE SODIUM 40 MG IV SOLR
40.0000 mg | INTRAVENOUS | Status: DC
  Administered 2012-04-21 – 2012-04-22 (×2): 40 mg via INTRAVENOUS
  Filled 2012-04-20 (×2): qty 40

## 2012-04-20 NOTE — ED Notes (Signed)
edp in with pt 
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
## 2012-04-20 NOTE — ED Notes (Signed)
Pt oxygen sats decreased to 66 while pt is sleeping per monitor. Pt noted to be pink, and no distress at this time. Family states pt was dozing off when alarm started. [place] @ 2 LPM placed on pt.

## 2012-04-20 NOTE — ED Notes (Signed)
Pt gone to CT 

## 2012-04-20 NOTE — ED Notes (Signed)
Pt reports severe epigastric pain that started around 5am today, pain is constant and getting worse, +nausea. No vomiting or diarrhea. ?fever at home.
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
## 2012-04-20 NOTE — ED Notes (Signed)
MD at bedside. Dr Jena Gauss and EDP at bedside discussing plan of care.
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
## 2012-04-21 ENCOUNTER — Inpatient Hospital Stay (HOSPITAL_COMMUNITY): Payer: BC Managed Care – PPO

## 2012-04-21 DIAGNOSIS — K810 Acute cholecystitis: Secondary | ICD-10-CM | POA: Diagnosis not present

## 2012-04-21 DIAGNOSIS — IMO0002 Reserved for concepts with insufficient information to code with codable children: Secondary | ICD-10-CM | POA: Diagnosis not present

## 2012-04-21 DIAGNOSIS — K219 Gastro-esophageal reflux disease without esophagitis: Secondary | ICD-10-CM | POA: Diagnosis not present

## 2012-04-21 DIAGNOSIS — K859 Acute pancreatitis without necrosis or infection, unspecified: Secondary | ICD-10-CM | POA: Diagnosis not present

## 2012-04-21 DIAGNOSIS — K8020 Calculus of gallbladder without cholecystitis without obstruction: Secondary | ICD-10-CM

## 2012-04-21 DIAGNOSIS — M47817 Spondylosis without myelopathy or radiculopathy, lumbosacral region: Secondary | ICD-10-CM | POA: Diagnosis not present

## 2012-04-21 LAB — LIPID PANEL
Cholesterol: 200 mg/dL (ref 0–200)
Total CHOL/HDL Ratio: 5 RATIO
Triglycerides: 112 mg/dL (ref <150)
VLDL: 22 mg/dL (ref 0–40)

## 2012-04-21 LAB — COMPREHENSIVE METABOLIC PANEL
BUN: 11 mg/dL (ref 6–23)
CO2: 30 mEq/L (ref 19–32)
Chloride: 104 mEq/L (ref 96–112)
Creatinine, Ser: 0.83 mg/dL (ref 0.50–1.35)
GFR calc Af Amer: >90 mL/min (ref >90)
GFR calc non Af Amer: >90 mL/min (ref >90)
Total Bilirubin: 1 mg/dL (ref 0.3–1.2)

## 2012-04-21 LAB — LIPASE, BLOOD: Lipase: 273 U/L — ABNORMAL HIGH (ref 11–59)

## 2012-04-21 LAB — CBC
Hemoglobin: 12.6 g/dL — ABNORMAL LOW (ref 13.0–17.0)
MCHC: 32.6 g/dL (ref 30.0–36.0)
RBC: 4.22 MIL/uL (ref 4.22–5.81)

## 2012-04-21 LAB — HEMOGLOBIN A1C
Hgb A1c MFr Bld: 5.4 % (ref <5.7)
Mean Plasma Glucose: 108 mg/dL (ref <117)

## 2012-04-21 MED ORDER — PIPERACILLIN-TAZOBACTAM 3.375 G IVPB
INTRAVENOUS | Status: AC
  Filled 2012-04-21: qty 50

## 2012-04-21 MED ORDER — HYDROMORPHONE HCL PF 1 MG/ML IJ SOLN
0.5000 mg | INTRAMUSCULAR | Status: DC | PRN
  Administered 2012-04-21 – 2012-04-22 (×5): 0.5 mg via INTRAVENOUS
  Filled 2012-04-21 (×5): qty 1

## 2012-04-21 MED ORDER — PREGABALIN 75 MG PO CAPS
150.0000 mg | ORAL_CAPSULE | Freq: Three times a day (TID) | ORAL | Status: DC
  Administered 2012-04-21 – 2012-04-22 (×2): 150 mg via ORAL
  Filled 2012-04-21 (×2): qty 2

## 2012-04-21 MED ORDER — PIPERACILLIN-TAZOBACTAM 3.375 G IVPB
3.3750 g | Freq: Three times a day (TID) | INTRAVENOUS | Status: DC
  Administered 2012-04-21 – 2012-04-22 (×5): 3.375 g via INTRAVENOUS
  Filled 2012-04-21 (×14): qty 50

## 2012-04-21 NOTE — Progress Notes (Signed)
UR Chart Review Completed  
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
## 2012-04-21 NOTE — Care Management Note (Unsigned)
    Page 1 of 1   04/21/2012     1:31:06 PM   CARE MANAGEMENT NOTE 04/21/2012  Patient:  Derek [name], Derek [name]   Account Number:  [id number]  Date Initiated:  04/21/2012  Documentation initiated by:  Rosemary Holms  Subjective/Objective Assessment:   Pt admitted with abdominal pain. PTA lived with wife and child. Pt sleeping but states he plans to return with no anticipated HH needs     Action/Plan:   Anticipated DC Date:  04/23/2012   Anticipated DC Plan:  HOME/SELF CARE      DC Planning Services  CM consult      Choice offered to / List presented to:             Status of service:  In process, will continue to follow Medicare Important Message given?   (If response is "NO", the following Medicare IM given date fields will be blank) Date Medicare IM given:   Date Additional Medicare IM given:    Discharge Disposition:    Per UR Regulation:    If discussed at Long Length of Stay Meetings, dates discussed:    Comments:  04/21/12 Rosemary Holms RN BSN CM

## 2012-04-21 NOTE — Progress Notes (Signed)
Subjective: This man is feeling better since admission. He describes generalized abdominal pain not radiating to the back. There is not much in the way of significant vomiting. His lipase was significantly elevated, now is improved but still elevated. CT scan of the abdomen suggestive of gallstone disease with possible cholecystitis.           Physical Exam: Blood pressure 110/69, pulse 68, temperature 97.9 F (36.6 C), temperature source Oral, resp. rate 20, height 6' (1.829 m), weight 102.2 kg (225 lb 5 oz), SpO2 100.00%. He actually looks systemically well. Is not toxic or septic. His abdomen is soft and appears to be tender in the right upper quadrant, epigastric area and umbilical area. I cannot really elicit a typical classic Murphy's sign. Lung fields are clear. Heart sounds are present and normal without gallop rhythm.   Investigations:     Basic Metabolic Panel:  Basename 04/21/12 0528 04/20/12 0819  NA 142 141  K 4.1 3.9  CL 104 100  CO2 30 32  GLUCOSE 91 144*  BUN 11 12  CREATININE 0.83 0.94  CALCIUM 9.2 10.2  MG -- --  PHOS -- --   Liver Function Tests:  [facility] 04/21/12 0528 04/20/12 0819  AST 203* 452*  ALT 344* 372*  ALKPHOS 127* 114  BILITOT 1.0 1.4*  PROT 6.0 7.7  ALBUMIN 3.5 4.5     CBC:  Basename 04/21/12 0519 04/20/12 0819  WBC 8.2 10.3  NEUTROABS -- 7.8*  HGB 12.6* 14.2  HCT 38.7* 43.4  MCV 91.7 91.8  PLT 270 319    Ct Abdomen Pelvis W Contrast  04/20/2012  *RADIOLOGY REPORT*  Clinical Data: Abdominal pain with nausea  CT ABDOMEN AND PELVIS WITH CONTRAST  Technique:  Multidetector CT imaging of the abdomen and pelvis was performed following the standard protocol during bolus administration of intravenous contrast. Oral contrast was also administered.  Contrast: 50mL OMNIPAQUE IOHEXOL 300 MG/ML  SOLN, OMNIPAQUE IOHEXOL 300 MG/ML  SOLN  Comparison: None.  Findings: There is mild scarring in the left lung base region. Lung  bases are otherwise clear. There is a small hiatal hernia.  There is a 6 mm cyst in the anterior segment right lobe of the liver near the dome.  No other focal liver lesions are identified. There is no biliary duct dilatation.  There is evidence of gallbladder wall thickening with pericholecystic fluid.  Pancreas overall is borderline prominent.  There is subtle mesenteric inflammation in the head of the pancreas.  Early pancreatitis is suspected.  No pancreatic mass or duct dilatation seen.  Spleen and adrenals appear normal.  There is no demonstrable right kidney.  Left kidney shows compensatory hypertrophy but otherwise appears within normal limits.  In the pelvis, there are sigmoid diverticula without diverticulitis.  Is no pelvic mass or fluid collection.  Appendix is not seen.  There is no periappendiceal region inflammation.  There is no bowel obstruction.  No free air or portal venous air.  There is no ascites, adenopathy, or abscess in the abdomen or pelvis.  Aorta is nonaneurysmal.  There is a stimulator seen in the soft tissues on the right posteriorly.  There is extensive postoperative change in the lumbar spine.  There is complex fluid seen in the soft tissues posterior to the lumbar spine with the collection measuring 16.4 x 7.7 x 5.1 cm.  Conclusion:  Findings suspicious for acalculus cholecystitis. Specifically, there is gallbladder wall thickening with pericholecystic fluid.  Suspect early  acute pancreatitis.  Pancreas is borderline prominent overall with subtle mesenteric inflammation in the head of the pancreas region.  No pancreatic mass or duct dilatation.  There is extensive fluid posterior to the area of extensive lumbar spine surgery.  The patient is undergone a fairly extensive the posterior multilevel laminectomy procedure with screw and plate fixation posteriorly from L2 through L4.  There are disc spacers at L3-4, L4-5, L5 and L5- S1.  An infected collection cannot be excluded based on  this study.  There is sigmoid diverticulosis without diverticulitis.  Right kidney is absent.  There is compensatory hypertrophy of the left kidney.  No bowel obstruction.  No abscess.  Small hiatal hernia.   Original Report Authenticated By: Bretta Bang, M.D.    Dg Chest Port 1 View  04/20/2012  *RADIOLOGY REPORT*  Clinical Data: Abdominal pain  PORTABLE CHEST - 1 VIEW  Comparison: 02/11/2006  Findings: Borderline cardiomegaly.  The study is limited by poor inspiration.  Small hiatal hernia.  Mild basilar atelectasis.  No acute infiltrate or pulmonary edema.  Question small left pleural effusion.  IMPRESSION: No acute infiltrate or pulmonary edema.  Question small left pleural effusion.  Borderline cardiomegaly.  Mild basilar atelectasis.   Original Report Authenticated By: Natasha Mead, M.D.       Medications: I have reviewed the patient's current medications.  Impression: 1. Acute pancreatitis, unclear etiology. Triglyceride level is not elevated. No history of excessive alcohol intake. Probably related to gallstones. 2. Possible cholecystitis. 3. 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     Plan: 1. Continue with current therapy with n.p.o., IV fluids and intravenous antibiotics. 2. Await ultrasound of the abdomen to further look at biliary pathology.     LOS: 1 day   Wilson Singer Pager [phone]  04/21/2012, 8:34 AM

## 2012-04-21 NOTE — Progress Notes (Signed)
Subjective: A little sore, pain not like it was. No N/V since last night. +dry heaves overnight.   Objective: Vital signs in last 24 hours: Temp:  [97.4 F (36.3 C)-98 F (36.7 C)] 97.9 F (36.6 C) (01/16 0445) Pulse Rate:  [62-90] 68  (01/16 0445) Resp:  [16-24] 20  (01/16 0445) BP: (110-147)/(69-90) 110/69 mmHg (01/16 0445) SpO2:  [95 %-100 %] 100 % (01/16 0445) Weight:  [225 lb 5 oz (102.2 kg)-230 lb (104.327 kg)] 225 lb 5 oz (102.2 kg) (01/15 1959)   General:   Alert and oriented, pleasant Head:  Normocephalic and atraumatic. Eyes:  No icterus, sclera clear. Conjuctiva pink.  Mouth:  Without lesions, mucosa pink and moist.  Heart:  S1, S2 present, no murmurs noted.  Lungs: Clear to auscultation bilaterally, without wheezing, rales, or rhonchi.  Abdomen:  Bowel sounds present, soft, mild TTP upper abdomen, non-distended. No HSM or hernias noted. Ventral hernia.  Msk:  Symmetrical without gross deformities. Normal posture. Neurologic:  Alert and  oriented x4;  grossly normal neurologically. Skin:  Warm and dry, intact without significant lesions.  Psych:  Alert and cooperative. Normal mood and affect.  Intake/Output from previous day:   Intake/Output this shift:    Lab Results:  Basename 04/21/12 0519 04/20/12 0819  WBC 8.2 10.3  HGB 12.6* 14.2  HCT 38.7* 43.4  PLT 270 319   BMET  Basename 04/21/12 0528 04/20/12 0819  NA 142 141  K 4.1 3.9  CL 104 100  CO2 30 32  GLUCOSE 91 144*  BUN 11 12  CREATININE 0.83 0.94  CALCIUM 9.2 10.2   LFT  Basename 04/21/12 0528 04/20/12 0819  PROT 6.0 7.7  ALBUMIN 3.5 4.5  AST 203* 452*  ALT 344* 372*  ALKPHOS 127* 114  BILITOT 1.0 1.4*  BILIDIR -- 1.0*  IBILI -- --   LIPASE ON ADMISSION: >3000 Today: 273  Studies/Results: Ct Abdomen Pelvis W Contrast  04/20/2012  *RADIOLOGY REPORT*  Clinical Data: Abdominal pain with nausea  CT ABDOMEN AND PELVIS WITH CONTRAST  Technique:  Multidetector CT imaging of the abdomen  and pelvis was performed following the standard protocol during bolus administration of intravenous contrast. Oral contrast was also administered.  Contrast: 50mL OMNIPAQUE IOHEXOL 300 MG/ML  SOLN, OMNIPAQUE IOHEXOL 300 MG/ML  SOLN  Comparison: None.  Findings: There is mild scarring in the left lung base region. Lung bases are otherwise clear. There is a small hiatal hernia.  There is a 6 mm cyst in the anterior segment right lobe of the liver near the dome.  No other focal liver lesions are identified. There is no biliary duct dilatation.  There is evidence of gallbladder wall thickening with pericholecystic fluid.  Pancreas overall is borderline prominent.  There is subtle mesenteric inflammation in the head of the pancreas.  Early pancreatitis is suspected.  No pancreatic mass or duct dilatation seen.  Spleen and adrenals appear normal.  There is no demonstrable right kidney.  Left kidney shows compensatory hypertrophy but otherwise appears within normal limits.  In the pelvis, there are sigmoid diverticula without diverticulitis.  Is no pelvic mass or fluid collection.  Appendix is not seen.  There is no periappendiceal region inflammation.  There is no bowel obstruction.  No free air or portal venous air.  There is no ascites, adenopathy, or abscess in the abdomen or pelvis.  Aorta is nonaneurysmal.  There is a stimulator seen in the soft tissues on the right posteriorly.  There  is extensive postoperative change in the lumbar spine.  There is complex fluid seen in the soft tissues posterior to the lumbar spine with the collection measuring 16.4 x 7.7 x 5.1 cm.  Conclusion:  Findings suspicious for acalculus cholecystitis. Specifically, there is gallbladder wall thickening with pericholecystic fluid.  Suspect early acute pancreatitis.  Pancreas is borderline prominent overall with subtle mesenteric inflammation in the head of the pancreas region.  No pancreatic mass or duct dilatation.  There is extensive  fluid posterior to the area of extensive lumbar spine surgery.  The patient is undergone a fairly extensive the posterior multilevel laminectomy procedure with screw and plate fixation posteriorly from L2 through L4.  There are disc spacers at L3-4, L4-5, L5 and L5- S1.  An infected collection cannot be excluded based on this study.  There is sigmoid diverticulosis without diverticulitis.  Right kidney is absent.  There is compensatory hypertrophy of the left kidney.  No bowel obstruction.  No abscess.  Small hiatal hernia.   Original Report Authenticated By: Bretta Bang, M.D.    Dg Chest Port 1 View  04/20/2012  *RADIOLOGY REPORT*  Clinical Data: Abdominal pain  PORTABLE CHEST - 1 VIEW  Comparison: 02/11/2006  Findings: Borderline cardiomegaly.  The study is limited by poor inspiration.  Small hiatal hernia.  Mild basilar atelectasis.  No acute infiltrate or pulmonary edema.  Question small left pleural effusion.  IMPRESSION: No acute infiltrate or pulmonary edema.  Question small left pleural effusion.  Borderline cardiomegaly.  Mild basilar atelectasis.   Original Report Authenticated By: Natasha Mead, M.D.     Assessment: 52 year old male with acute pancreatitis likely secondary to biliary etiology; [place] of abdomen ordered today to assess further for gallstones. Clinically improving. Triglycerides normal, no ETOH abuse. Consider EUS in future after illness resolves if etiology remains unclear.   Plan: Remain NPO until after [place] of abd, then advance to clears Supportive care Back off IVFs to 150 ml/hour PPI for prophylaxis Further recommendations after review of [place]   LOS: 1 day   Gerrit Halls  04/21/2012, 7:52 AM   As above. Orthopedic consultation reviewed. Patient has cholelithiasis and a thickened gallbladder wall on ultrasound. Common bile duct is not dilated. Clinical scenario most consistent with gallstone pancreatitis which appears to be improving and uncomplicated at this time.  I  suggest we continue current supportive care for pancreatitis. Follow LFTs to complete normality.  As long as LFTs completely normalized, I do not feel that endoscopic ultrasound would be needed. He should have an elective surgery consultation for laparoscopic cholecystectomy in the coming next 1-2 weeks.

## 2012-04-22 DIAGNOSIS — K8020 Calculus of gallbladder without cholecystitis without obstruction: Secondary | ICD-10-CM

## 2012-04-22 DIAGNOSIS — K859 Acute pancreatitis without necrosis or infection, unspecified: Secondary | ICD-10-CM | POA: Diagnosis not present

## 2012-04-22 LAB — COMPREHENSIVE METABOLIC PANEL
AST: 73 U/L — ABNORMAL HIGH (ref 0–37)
BUN: 11 mg/dL (ref 6–23)
CO2: 33 mEq/L — ABNORMAL HIGH (ref 19–32)
Calcium: 9.5 mg/dL (ref 8.4–10.5)
Creatinine, Ser: 1.06 mg/dL (ref 0.50–1.35)
GFR calc Af Amer: >90 mL/min (ref >90)
GFR calc non Af Amer: 78 mL/min — ABNORMAL LOW (ref >90)

## 2012-04-22 LAB — GLUCOSE, CAPILLARY: Glucose-Capillary: 77 mg/dL (ref 70–99)

## 2012-04-22 LAB — CBC
MCH: 30 pg (ref 26.0–34.0)
MCV: 91.8 fL (ref 78.0–100.0)
Platelets: 245 10*3/uL (ref 150–400)
RBC: 4.16 MIL/uL — ABNORMAL LOW (ref 4.22–5.81)

## 2012-04-22 MED ORDER — DOCUSATE SODIUM 100 MG PO CAPS
100.0000 mg | ORAL_CAPSULE | Freq: Two times a day (BID) | ORAL | Status: DC
  Administered 2012-04-22: 100 mg via ORAL
  Filled 2012-04-22: qty 1

## 2012-04-22 MED ORDER — HYDROCODONE-ACETAMINOPHEN 10-325 MG PO TABS: 1.0000 | ORAL_TABLET | Freq: Four times a day (QID) | ORAL | Status: DC | PRN

## 2012-04-22 MED ORDER — SODIUM CHLORIDE 0.9 % IJ SOLN
INTRAMUSCULAR | Status: AC
Start: 2012-04-22 — End: 2012-04-22
  Filled 2012-04-22: qty 3

## 2012-04-22 NOTE — Progress Notes (Signed)
Subjective: Pt reports abdominal pain RLQ, RUQ much better 4/10 at worst.  Feeling better, asking if he can go home.  Tolerating clear liquids well.  Denies N/V.  No BM x several days, hx chronic constipation.  RUQ US->Cholelithiasis, irregular gallbladder wall thickening 14 mm, CBD 5.5, no choledocholithiasis visualized.   Objective: Vital signs in last 24 hours: Temp:  [98.3 F (36.8 C)-98.7 F (37.1 C)] 98.3 F (36.8 C) (01/16 2038) Pulse Rate:  [68-70] 70  (01/16 2038) Resp:  [20] 20  (01/16 2038) BP: (125-132)/(76-77) 125/77 mmHg (01/16 2038) SpO2:  [94 %-97 %] 97 % (01/16 2038) Last BM Date: 04/20/12 No LMP for male patient. Body mass index is 30.56 kg/(m^2). General:   Alert, pleasant and cooperative in NAD.  Accompanied by wife. Eyes:  Sclera clear, no icterus.   Conjunctiva pink. Mouth:  oropharynx pink & moist. Heart:  Regular rate and rhythm; no murmurs, clicks, rubs,  or gallops. Abdomen:   Normal bowel sounds.  Soft, nondistended. +mild RLQ/RUQ TTP.  No guarding or rebound tenderness.   Msk:  Symmetrical without gross deformities. Pulses:  Normal pulses noted. Extremities:  Without edema. Neurologic:  Alert and  oriented x4;  grossly normal neurologically. Skin:  Intact without significant lesions or rashes. Cervical Nodes:  No significant cervical adenopathy. Psych:  Alert and cooperative. Normal mood and affect.  Intake/Output from previous day: 01/16 0701 - 01/17 0700 In: 1200 [P.O.:1200] Out: -   Lab Results:  Basename 04/22/12 0508 04/21/12 0519 04/20/12 0819  WBC 8.4 8.2 10.3  HGB 12.5* 12.6* 14.2  HCT 38.2* 38.7* 43.4  PLT 245 270 319   BMET  Basename 04/22/12 0508 04/21/12 0528 04/20/12 0819  NA 139 142 141  K 3.6 4.1 3.9  CL 100 104 100  CO2 33* 30 32  GLUCOSE 78 91 144*  BUN 11 11 12   CREATININE 1.06 0.83 0.94  CALCIUM 9.5 9.2 10.2   LFT  Basename 04/22/12 0508 04/21/12 0528 04/21/12 0519 04/20/12 0819  PROT 6.3 6.0 -- 7.7  ALBUMIN 3.6  3.5 -- 4.5  AST 73* 203* -- 452*  ALT 205* 344* -- 372*  ALKPHOS 111 127* -- 114  BILITOT 0.5 1.0 -- 1.4*  BILIDIR -- -- -- 1.0*  IBILI -- -- -- --  LIPASE -- -- 273* >3000*  AMYLASE -- -- -- --   Studies/Results: Ct Abdomen Pelvis W Contrast  04/20/2012  *RADIOLOGY REPORT*  Clinical Data: Abdominal pain with nausea  CT ABDOMEN AND PELVIS WITH CONTRAST  Technique:  Multidetector CT imaging of the abdomen and pelvis was performed following the standard protocol during bolus administration of intravenous contrast. Oral contrast was also administered.  Contrast: 50mL OMNIPAQUE IOHEXOL 300 MG/ML  SOLN, OMNIPAQUE IOHEXOL 300 MG/ML  SOLN  Comparison: None.  Findings: There is mild scarring in the left lung base region. Lung bases are otherwise clear. There is a small hiatal hernia.  There is a 6 mm cyst in the anterior segment right lobe of the liver near the dome.  No other focal liver lesions are identified. There is no biliary duct dilatation.  There is evidence of gallbladder wall thickening with pericholecystic fluid.  Pancreas overall is borderline prominent.  There is subtle mesenteric inflammation in the head of the pancreas.  Early pancreatitis is suspected.  No pancreatic mass or duct dilatation seen.  Spleen and adrenals appear normal.  There is no demonstrable right kidney.  Left kidney shows compensatory hypertrophy but otherwise appears within  normal limits.  In the pelvis, there are sigmoid diverticula without diverticulitis.  Is no pelvic mass or fluid collection.  Appendix is not seen.  There is no periappendiceal region inflammation.  There is no bowel obstruction.  No free air or portal venous air.  There is no ascites, adenopathy, or abscess in the abdomen or pelvis.  Aorta is nonaneurysmal.  There is a stimulator seen in the soft tissues on the right posteriorly.  There is extensive postoperative change in the lumbar spine.  There is complex fluid seen in the soft tissues posterior  to the lumbar spine with the collection measuring 16.4 x 7.7 x 5.1 cm.  Conclusion:  Findings suspicious for acalculus cholecystitis. Specifically, there is gallbladder wall thickening with pericholecystic fluid.  Suspect early acute pancreatitis.  Pancreas is borderline prominent overall with subtle mesenteric inflammation in the head of the pancreas region.  No pancreatic mass or duct dilatation.  There is extensive fluid posterior to the area of extensive lumbar spine surgery.  The patient is undergone a fairly extensive the posterior multilevel laminectomy procedure with screw and plate fixation posteriorly from L2 through L4.  There are disc spacers at L3-4, L4-5, L5 and L5- S1.  An infected collection cannot be excluded based on this study.  There is sigmoid diverticulosis without diverticulitis.  Right kidney is absent.  There is compensatory hypertrophy of the left kidney.  No bowel obstruction.  No abscess.  Small hiatal hernia.   Original Report Authenticated By: Bretta Bang, M.D.    Dg Chest Port 1 View  04/20/2012  *RADIOLOGY REPORT*  Clinical Data: Abdominal pain  PORTABLE CHEST - 1 VIEW  Comparison: 02/11/2006  Findings: Borderline cardiomegaly.  The study is limited by poor inspiration.  Small hiatal hernia.  Mild basilar atelectasis.  No acute infiltrate or pulmonary edema.  Question small left pleural effusion.  IMPRESSION: No acute infiltrate or pulmonary edema.  Question small left pleural effusion.  Borderline cardiomegaly.  Mild basilar atelectasis.   Original Report Authenticated By: Natasha Mead, M.D.    US Abdomen Limited Ruq  04/21/2012  *RADIOLOGY REPORT*  Clinical Data:  Pancreatitis, evaluate for gallstones  LIMITED ABDOMINAL ULTRASOUND - RIGHT UPPER QUADRANT  Comparison:  CT of the pelvis dated 04/20/2012  Findings:  Gallbladder:  Multiple gallstones.  Irregular gallbladder wall thickening measuring up to 14 mm.  Negative sonographic Murphy's sign.  Common bile duct:  Measures  5.5 mm.  Liver:  Incompletely visualized.  No focal hepatic lesion is seen.  IMPRESSION: Cholelithiasis with irregular gallbladder wall thickening measuring up to 14 mm. Negative sonographic Murphy's sign.  In the setting of known pancreatitis, the wall thickening may reflect secondary inflammatory changes.  If there is clinical concern for acute cholecystitis, a hepatobiliary nuclear medicine scan is suggested.  Common duct measures 5.5 mm, at the upper limits of normal.  No choledocholithiasis is seen by [place].   Original Report Authenticated By: Charline Bills, M.D.     Assessment: Acute gallstone pancreatitis:  Improved Transaminitis:  Improving Constipation, Chronic   Plan: 1. Add colace stool softeners BID 2. Continue to advance diet as tolerated to low fat diet 3. Supportive measures 4. Outpatient surgical consult in 1-2 weeks for cholecystectomy 5. Follow transaminases to baseline  LOS: 2 days   Derek [name]  04/22/2012, 9:04 AM

## 2012-04-22 NOTE — Discharge Summary (Signed)
Physician Discharge Summary  Derek [name] UJW:[id number] DOB: 1960/11/17 DOA: 04/20/2012  PCP: Kirk Ruths, MD  Admit date: 04/20/2012 Discharge date: 04/22/2012  Time spent: Less than 30 minutes  Recommendations for Outpatient Follow-up:  1. Followup with gastroenterology. 2. Followup with general surgery for cholecystectomy.   Discharge Diagnoses:  1. Gallstone acute pancreatitis. 2. Cholelithiasis.   Discharge Condition: Stable and improving.  Diet recommendation: Low-fat diet.  Filed Weights   04/20/12 0853 04/20/12 1959  Weight: 104.327 kg (230 lb) 102.2 kg (225 lb 5 oz)    History of present illness:  This very pleasant 52 year old man presented to the hospital with symptoms of abdominal pain. Please see initial history as outlined below: HPI: Derek [name] is a 52 y.o. male with a past medical history significant for multiple lumbar surgeries and C-spine surgeries, removal of cystadenoma of the pancreas in 2003, hypertriglyceridemia, and erosive esophagitis from NSAID overuse. His last lumbar surgical intervention was on 04/14/2012 by Dr. Sharolyn Douglas at [facility] which also included implantation of a spinal cord stimulator for chronic muscle spasm and low back pain-he reports doing very well post operatively with no increased pain or problems healing. He came into ED complaining of acute onset abdominal pain, mostly epigastric/diffuse associated with nausea, vomiting, and difficulty keeping down anything PO. In the ED his lipase was >3000, elevated LFTs, and a CT of his abdomen showed pancreatitis and possible acalculous choleystitis with a pericolic fluid collection. Fortunately he is afebrile and does not have an elevated WBC count. Incidentally he also reports taking large doses of aspirin for pain. He denies regular alcohol use. He has recently been put on Niacin for his cholesterol medication. The CT also showed a very large soft tissue fluid  collection near his surgical site incision, but he does not complain of pain, redness or complications from surgery.  Admission requested for pancreatitis and continued evaluation of liver, gallbladder and post-op fluid collection. Gastroenterology has seen patient in the ED as well as general surgery.   Hospital Course:  The patient was admitted he was noticed to have acute pancreatitis on biochemical markers and CT abdominal scan. The etiology of his pancreatitis was not entirely clear but an ultrasound of the gallbladder confirmed the presence of multiple gallstones and together with elevated liver enzymes which continued to improve on daily basis, it was felt that he indeed did have acute gallstone pancreatitis. He was seen by gastroenterology who recommended cholecystectomy once his pancreatitis resolved clinically. He is much improved since presentation in and he has been able to tolerate a diet. He is keen to go home.  Procedures:  None. (i.e. Studies not automatically included, echos, thoracentesis, etc; not x-rays)  Consultations:  Gastroenterology, Dr. Kendell Bane.    Discharge Exam: Filed Vitals:   04/20/12 1959 04/21/12 0445 04/21/12 1534 04/21/12 2038  BP: 147/90 110/69 132/76 125/77  Pulse: 76 68 68 70  Temp: 98 F (36.7 C) 97.9 F (36.6 C) 98.7 F (37.1 C) 98.3 F (36.8 C)  TempSrc: Oral Oral Oral Oral  Resp: 20 20 20 20   Height: 6' (1.829 m)     Weight: 102.2 kg (225 lb 5 oz)     SpO2: 98% 100% 94% 97%    General: He looks systemically well. Is not toxic or septic. Cardiovascular: Heart sounds are present without murmurs or added sounds. Respiratory: Lung fields are clear. Abdomen is soft and nontender. This is an improvement from previously.  Discharge Instructions  Discharge Orders    Future Orders Please Complete By Expires   Diet - low sodium heart healthy      Increase activity slowly          Medication List     As of 04/22/2012 10:47 AM    TAKE  these medications         aspirin 325 MG tablet   Take 325 mg by mouth daily.      diphenhydramine-acetaminophen 25-500 MG Tabs   Commonly known as: TYLENOL PM   Take 2.5 tablets by mouth at bedtime as needed. For sleep      Fiber 0.52 G Caps   Take 4 capsules by mouth daily.      HYDROcodone-acetaminophen 10-325 MG per tablet   Commonly known as: NORCO   Take 1 tablet by mouth every 6 (six) hours as needed. For pain. Takes only 2-3 tablets monthly      KRILL OIL 1000 MG Caps   Take 1 capsule by mouth daily.      methocarbamol 500 MG tablet   Commonly known as: ROBAXIN   Take 500 mg by mouth at bedtime as needed. Muscle spasms.      multivitamin tablet   Take 1 tablet by mouth daily.      niacin 100 MG tablet   Take 100 mg by mouth daily with breakfast.      omeprazole 20 MG capsule   Commonly known as: PRILOSEC   Take 20 mg by mouth 2 (two) times daily.      pregabalin 75 MG capsule   Commonly known as: LYRICA   Take 150 mg by mouth 2 (two) times daily.      Saw Palmetto (Serenoa repens) 1000 MG Caps   Take 1 capsule by mouth daily.      vitamin B-12 1000 MCG tablet   Commonly known as: CYANOCOBALAMIN   Take 1,000 mcg by mouth daily.      vitamin C 100 MG tablet   Take 100 mg by mouth daily.           Follow-up Information    Follow up with Patricia Nettle, MD.   Contact information:   [address] [place] [address] [phone]       Follow up with Eula Listen, MD. Schedule an appointment as soon as possible for a visit in 1 week.   Contact information:   [address] [place] [address] [phone]       Follow up with Marlane Hatcher, MD. Schedule an appointment as soon as possible for a visit in 2 weeks.   Contact information:   [address] [place] [address] [phone]           The results of significant diagnostics from this hospitalization (including imaging, microbiology,  ancillary and laboratory) are listed below for reference.    Significant Diagnostic Studies: Ct Abdomen Pelvis W Contrast  04/20/2012  *RADIOLOGY REPORT*  Clinical Data: Abdominal pain with nausea  CT ABDOMEN AND PELVIS WITH CONTRAST  Technique:  Multidetector CT imaging of the abdomen and pelvis was performed following the standard protocol during bolus administration of intravenous contrast. Oral contrast was also administered.  Contrast: 50mL OMNIPAQUE IOHEXOL 300 MG/ML  SOLN, OMNIPAQUE IOHEXOL 300 MG/ML  SOLN  Comparison: None.  Findings: There is mild scarring in the left lung base region. Lung bases are otherwise clear. There is a small hiatal hernia.  There is a 6 mm cyst in the anterior segment right lobe of the liver near the dome.  No other focal liver lesions are identified. There is no biliary duct dilatation.  There is evidence of gallbladder wall thickening with pericholecystic fluid.  Pancreas overall is borderline prominent.  There is subtle mesenteric inflammation in the head of the pancreas.  Early pancreatitis is suspected.  No pancreatic mass or duct dilatation seen.  Spleen and adrenals appear normal.  There is no demonstrable right kidney.  Left kidney shows compensatory hypertrophy but otherwise appears within normal limits.  In the pelvis, there are sigmoid diverticula without diverticulitis.  Is no pelvic mass or fluid collection.  Appendix is not seen.  There is no periappendiceal region inflammation.  There is no bowel obstruction.  No free air or portal venous air.  There is no ascites, adenopathy, or abscess in the abdomen or pelvis.  Aorta is nonaneurysmal.  There is a stimulator seen in the soft tissues on the right posteriorly.  There is extensive postoperative change in the lumbar spine.  There is complex fluid seen in the soft tissues posterior to the lumbar spine with the collection measuring 16.4 x 7.7 x 5.1 cm.  Conclusion:  Findings suspicious for acalculus  cholecystitis. Specifically, there is gallbladder wall thickening with pericholecystic fluid.  Suspect early acute pancreatitis.  Pancreas is borderline prominent overall with subtle mesenteric inflammation in the head of the pancreas region.  No pancreatic mass or duct dilatation.  There is extensive fluid posterior to the area of extensive lumbar spine surgery.  The patient is undergone a fairly extensive the posterior multilevel laminectomy procedure with screw and plate fixation posteriorly from L2 through L4.  There are disc spacers at L3-4, L4-5, L5 and L5- S1.  An infected collection cannot be excluded based on this study.  There is sigmoid diverticulosis without diverticulitis.  Right kidney is absent.  There is compensatory hypertrophy of the left kidney.  No bowel obstruction.  No abscess.  Small hiatal hernia.   Original Report Authenticated By: Bretta Bang, M.D.    Dg Chest Port 1 View  04/20/2012  *RADIOLOGY REPORT*  Clinical Data: Abdominal pain  PORTABLE CHEST - 1 VIEW  Comparison: 02/11/2006  Findings: Borderline cardiomegaly.  The study is limited by poor inspiration.  Small hiatal hernia.  Mild basilar atelectasis.  No acute infiltrate or pulmonary edema.  Question small left pleural effusion.  IMPRESSION: No acute infiltrate or pulmonary edema.  Question small left pleural effusion.  Borderline cardiomegaly.  Mild basilar atelectasis.   Original Report Authenticated By: Natasha Mead, M.D.    US Abdomen Limited Ruq  04/21/2012  *RADIOLOGY REPORT*  Clinical Data:  Pancreatitis, evaluate for gallstones  LIMITED ABDOMINAL ULTRASOUND - RIGHT UPPER QUADRANT  Comparison:  CT of the pelvis dated 04/20/2012  Findings:  Gallbladder:  Multiple gallstones.  Irregular gallbladder wall thickening measuring up to 14 mm.  Negative sonographic Murphy's sign.  Common bile duct:  Measures 5.5 mm.  Liver:  Incompletely visualized.  No focal hepatic lesion is seen.  IMPRESSION: Cholelithiasis with irregular  gallbladder wall thickening measuring up to 14 mm. Negative sonographic Murphy's sign.  In the setting of known pancreatitis, the wall thickening may reflect secondary inflammatory changes.  If there is clinical concern for acute cholecystitis, a hepatobiliary nuclear medicine scan is suggested.  Common duct measures 5.5 mm, at the upper limits of normal.  No choledocholithiasis is seen by [place].   Original Report Authenticated By:  Charline Bills, M.D.         Labs: Basic Metabolic Panel:  Lab 04/22/12 4098 04/21/12 0528 04/20/12 0819  NA 139 142 141  K 3.6 4.1 3.9  CL 100 104 100  CO2 33* 30 32  GLUCOSE 78 91 144*  BUN 11 11 12   CREATININE 1.06 0.83 0.94  CALCIUM 9.5 9.2 10.2  MG -- -- --  PHOS -- -- --   Liver Function Tests:  Lab 04/22/12 0508 04/21/12 0528 04/20/12 0819  AST 73* 203* 452*  ALT 205* 344* 372*  ALKPHOS 111 127* 114  BILITOT 0.5 1.0 1.4*  PROT 6.3 6.0 7.7  ALBUMIN 3.6 3.5 4.5    Lab 04/21/12 0519 04/20/12 0819  LIPASE 273* >3000*  AMYLASE -- --    CBC:  Lab 04/22/12 0508 04/21/12 0519 04/20/12 0819  WBC 8.4 8.2 10.3  NEUTROABS -- -- 7.8*  HGB 12.5* 12.6* 14.2  HCT 38.2* 38.7* 43.4  MCV 91.8 91.7 91.8  PLT 245 270 319   Cardiac Enzymes:  Lab 04/20/12 0819  CKTOTAL --  CKMB --  CKMBINDEX --  TROPONINI <0.30    CBG:  Lab 04/22/12 0745 04/21/12 0723  GLUCAP 77 81       Signed:  GOSRANI,NIMISH C  [facility] 04/22/2012, 10:47 AM

## 2012-04-22 NOTE — Progress Notes (Signed)
REVIEWED. AGREE. 

## 2012-04-22 NOTE — Progress Notes (Signed)
04/22/12 1203 Patient discharged home, reviewed discharge instructions with patient and wife at bedside. Given copy of instructions, medication list, f/u appointments as scheduled. Discussed diet modifications, low fat diet to help prevent further gallbladder attack with patient/wife. Verbalized understanding. Notified Dr Karilyn Cota of patient request for nausea medication prescription, stated will call in to pharmacy. IV site d/c'd per patient and site within normal limits. No c/o pain or discomfort prior to discharge. Pt left floor in stable condition via w/c accompanied by nurse tech. Earnstine Regal, RN

## 2012-04-25 LAB — CULTURE, BLOOD (ROUTINE X 2): Culture: NO GROWTH

## 2012-05-02 DIAGNOSIS — K8020 Calculus of gallbladder without cholecystitis without obstruction: Secondary | ICD-10-CM | POA: Diagnosis not present

## 2012-05-02 DIAGNOSIS — K859 Acute pancreatitis without necrosis or infection, unspecified: Secondary | ICD-10-CM | POA: Diagnosis not present

## 2012-05-03 NOTE — Consult Note (Signed)
NAME:  Derek [name], Derek [name] NO.:  [id number]  MEDICAL RECORD NO.:  [id number]  LOCATION:                                 FACILITY:  PHYSICIAN:  Barbaraann Barthel, M.D. DATE OF BIRTH:  25-Dec-1960  DATE OF CONSULTATION:  05/02/2012 DATE OF DISCHARGE:                                CONSULTATION   NOTE:  This is a 52 year old white male who was admitted to the hospital on April 27, 2012 and discharged on April 29, 2012, for gallstone pancreatitis.  His pancreatic enzymes and liver function studies have had a downward trend and at present, his abdomen is soft and he has plans to see the gastroenterologist and repeat liver function studies and lipase levels prior to elective surgery.  He has also been placed on a restrictive diet and is told to contact us should there be any acute changes.  Clinically, he is asymptomatic at present and he is afebrile.  PAST MEDICAL HISTORY:  Includes a history of hypercholesterolemia.  PAST SURGERIES:  Included an excision of distal pancreatic cystadenoma, benign by me in February 2003.  He also had some left foot surgery and some right thoracoscopic knee surgery and his main problem recently has been he has had multiple back surgeries with a stimulator implanted in January of 2013.  He has no known allergies.  He is a nonsmoker and nondrinker.  For medications, see present medication list.  PHYSICAL EXAMINATION:  VITAL SIGNS:  His temperature is 98.2.  He is 6 feet tall, weighs 220 pounds, his pulse is 77 per minute and regular, respirations are 16, blood pressure 121/84. HEENT:  Head is normocephalic.  Eyes, extraocular movements are intact. Pupils are round and reactive to light and accommodation.  There is no conjunctive pallor or icterus noted.  No bruits are appreciated and there is no adenopathy.  Oral and nasal mucosa is moist. CHEST:  Clear, both anterior and posterior auscultation. HEART:  Regular rhythm. ABDOMEN:   Completely soft.  He has a well-healed left upper quadrant incision extending over the midline, which is consistent with my previous pancreatic surgery.  There are no hernias appreciated here. There are no other masses palpated and he has absolutely no tenderness in the right upper quadrant at present. GENITAL EXAMINATION:  He has an atrophic right testicle probably from he believes a bout of the mumps when he was younger.  There are no femoral or inguinal hernias appreciated. RECTAL EXAMINATION:  The prostate is smooth but  enlarged and the stool is guaiac-negative.  EXTREMITIES:  There is no edema or clubbing. He has some diminished pulses on the right side of his leg.  He has a good and well-palpated popliteal pulse and dorsalis pedis on the left.  REVIEW OF SYSTEMS:  NEUROLOGIC:  No history of migraines or seizures or any lateralizing neurological findings.  ENDOCRINE:  No history of diabetes or thyroid disease.  CARDIOPULMONARY:  He is a nonsmoker and nondrinker.  He has some hyperlipidemia history.  MUSCULOSKELETAL:  The patient has chronic back pain.  He is status post 6 gallbladder surgeries with fusions and stimulator implanted in January of 2013.  He has also had right  knee arthroscopic surgery and left foot surgery.  GI: No past history of hepatitis.  He has occasional constipation, which is related to the narcotics that he takes occasionally for his chronic pains.  He has no history of diarrhea, bright red rectal bleeding, black tarry stools or unexplained weight loss.  No history of inflammatory bowel disease or irritable bowel syndrome.  There is no unexplained weight loss.  He had a colonoscopy done and had benign polyp removed in 2012.  GU:  No history of frequency, dysuria, or kidney stones.  This patient's had a CT scan, which showed some mild inflammation around the head of the pancreas.  The CT scan interestingly did not show the presence of stones at that time.   When he was admitted, he had an elevated AST and ALT.  His bilirubin was normal and his lipase was over 3000.  Since his admission, these have had a downward trend and his bilirubin is still grossly within normal limits and his lipase and liver function studies all are trending downward and clinically, he has no epigastric pain or any radiating pain towards his back suggestive of pancreatic pain.  Sonogram was done, which revealed multiple stones within the gallbladder.  I discussed this case with the GI specialist who saw him while he was in the hospital and has plans to see him on May 10, 2012, at which time, these liver function studies and lipase will be repeated along with an INR and prior to my planned surgery, which is May 13, 2012, with him according to our discussions and agreement together.  Separate issue is this stimulator device that has been implanted.  I am not sure whether this needs to be turned off or there is something that needs to be done with that.  So I have called Dr. Jake Seats Cohen's office and discussed this with his PA, who will pass on the message and I gave him my cellphone and he will be calling me regarding his instructions related to this device.  Hopefully, this can be taken care of before the 7th, at which time, his surgery is planned via the outpatient department.  I discussed the surgery with the patient in detail, discussing complications, not limited to, but including bleeding, infection, damage to bile ducts, perforation of organs, transitory diarrhea, and the possibility that open surgery might be required.  Informed consent was obtained.     Barbaraann Barthel, M.D.     WB/MEDQ  D:  05/02/2012  T:  05/03/2012  Job:  [id number]  cc:   Sharolyn Douglas, M.D. Fax: [phone]  R. Roetta Sessions, MD FACP [facility] [address] [place] [address]

## 2012-05-04 ENCOUNTER — Telehealth: Payer: Self-pay | Admitting: Internal Medicine

## 2012-05-04 ENCOUNTER — Encounter (HOSPITAL_COMMUNITY): Payer: Self-pay | Admitting: Pharmacy Technician

## 2012-05-04 NOTE — Telephone Encounter (Signed)
Pt is aware that we will order the labs when he has his OV. He does not have to be NPO

## 2012-05-04 NOTE — Telephone Encounter (Signed)
Pt called this afternoon to confirm his OV on 2/4 with AS. He has multiple questions about having lab work done and whether or not he can eat or drink. I told him that if AS needed to order labs she would during his OV and go over any restrictions. Pt said that Dr Malvin Johns and RMR had a discussion about his lab work and he needed to know. I told him that I knew nothing of that conversation and I could have the nurse call him back. Pt agreed. Please call him at [phone]

## 2012-05-06 NOTE — Patient Instructions (Addendum)
Derek [name]  05/06/2012   Your procedure is scheduled on:  05/13/12  Report to [facility] at [place] AM.  Call this number if you have problems the morning of surgery: [phone]   Remember:   Do not eat food or drink liquids after midnight.   Take these medicines the morning of surgery with A SIP OF WATER: prilosec, lyrica   Do not wear jewelry, make-up or nail polish.  Do not wear lotions, powders, or perfumes. You may wear deodorant.  Do not shave 48 hours prior to surgery. Men may shave face and neck.  Do not bring valuables to the hospital.  Contacts, dentures or bridgework may not be worn into surgery.  Leave suitcase in the car. After surgery it may be brought to your room.  For patients admitted to the hospital, checkout time is 11:00 AM the day of  discharge.   Patients discharged the day of surgery will not be allowed to drive  home.  Name and phone number of your driver: family  Special Instructions: Shower using CHG 2 nights before surgery and the night before surgery.  If you shower the day of surgery use CHG.  Use special wash - you have one bottle of CHG for all showers.  You should use approximately 1/3 of the bottle for each shower.   Please read over the following fact sheets that you were given: Pain Booklet, MRSA Information, Surgical Site Infection Prevention, Anesthesia Post-op Instructions and Care and Recovery After Surgery   PATIENT INSTRUCTIONS POST-ANESTHESIA  IMMEDIATELY FOLLOWING SURGERY:  Do not drive or operate machinery for the first twenty four hours after surgery.  Do not make any important decisions for twenty four hours after surgery or while taking narcotic pain medications or sedatives.  If you develop intractable nausea and vomiting or a severe headache please notify your doctor immediately.  FOLLOW-UP:  Please make an appointment with your surgeon as instructed. You do not need to follow up with anesthesia unless specifically instructed to do  so.  WOUND CARE INSTRUCTIONS (if applicable):  Keep a dry clean dressing on the anesthesia/puncture wound site if there is drainage.  Once the wound has quit draining you may leave it open to air.  Generally you should leave the bandage intact for twenty four hours unless there is drainage.  If the epidural site drains for more than 36-48 hours please call the anesthesia department.  QUESTIONS?:  Please feel free to call your physician or the hospital operator if you have any questions, and they will be happy to assist you.      Laparoscopic Cholecystectomy Laparoscopic cholecystectomy is surgery to remove the gallbladder. The gallbladder is located slightly to the right of center in the abdomen, behind the liver. It is a concentrating and storage sac for the bile produced in the liver. Bile aids in the digestion and absorption of fats. Gallbladder disease (cholecystitis) is an inflammation of your gallbladder. This condition is usually caused by a buildup of gallstones (cholelithiasis) in your gallbladder. Gallstones can block the flow of bile, resulting in inflammation and pain. In severe cases, emergency surgery may be required. When emergency surgery is not required, you will have time to prepare for the procedure. Laparoscopic surgery is an alternative to open surgery. Laparoscopic surgery usually has a shorter recovery time. Your common bile duct may also need to be examined and explored. Your caregiver will discuss this with you if he or she feels this should be done. If  stones are found in the common bile duct, they may be removed. LET YOUR CAREGIVER KNOW ABOUT:  Allergies to food or medicine.  Medicines taken, including vitamins, herbs, eyedrops, over-the-counter medicines, and creams.  Use of steroids (by mouth or creams).  Previous problems with anesthetics or numbing medicines.  History of bleeding problems or blood clots.  Previous surgery.  Other health problems, including  diabetes and kidney problems.  Possibility of pregnancy, if this applies. RISKS AND COMPLICATIONS All surgery is associated with risks. Some problems that may occur following this procedure include:  Infection.  Damage to the common bile duct, nerves, arteries, veins, or other internal organs such as the stomach or intestines.  Bleeding.  A stone may remain in the common bile duct. BEFORE THE PROCEDURE  Do not take aspirin for 3 days prior to surgery or blood thinners for 1 week prior to surgery.  Do not eat or drink anything after midnight the night before surgery.  Let your caregiver know if you develop a cold or other infectious problem prior to surgery.  You should be present 60 minutes before the procedure or as directed. PROCEDURE  You will be given medicine that makes you sleep (general anesthetic). When you are asleep, your surgeon will make several small cuts (incisions) in your abdomen. One of these incisions is used to insert a small, lighted scope (laparoscope) into the abdomen. The laparoscope helps the surgeon see into your abdomen. Carbon dioxide gas will be pumped into your abdomen. The gas allows more room for the surgeon to perform your surgery. Other operating instruments are inserted through the other incisions. Laparoscopic procedures may not be appropriate when:  There is major scarring from previous surgery.  The gallbladder is extremely inflamed.  There are bleeding disorders or unexpected cirrhosis of the liver.  A pregnancy is near term.  Other conditions make the laparoscopic procedure impossible. If your surgeon feels it is not safe to continue with a laparoscopic procedure, he or she will perform an open abdominal procedure. In this case, the surgeon will make an incision to open the abdomen. This gives the surgeon a larger view and field to work within. This may allow the surgeon to perform procedures that sometimes cannot be performed with a  laparoscope alone. Open surgery has a longer recovery time. AFTER THE PROCEDURE  You will be taken to the recovery area where a nurse will watch and check your progress.  You may be allowed to go home the same day.  Do not resume physical activities until directed by your caregiver.  You may resume a normal diet and activities as directed. Document Released: 03/23/2005 Document Revised: 06/15/2011 Document Reviewed: 09/05/2010 [facility] Patient Information 2013 [place], [place].

## 2012-05-09 ENCOUNTER — Encounter: Payer: Self-pay | Admitting: Internal Medicine

## 2012-05-09 ENCOUNTER — Encounter (HOSPITAL_COMMUNITY)
Admission: RE | Admit: 2012-05-09 | Discharge: 2012-05-09 | Disposition: A | Discharge status: Home / Self Care | Payer: BC Managed Care – PPO | Source: Ambulatory Visit | Attending: General Surgery | Admitting: General Surgery

## 2012-05-09 DIAGNOSIS — Z01812 Encounter for preprocedural laboratory examination: Secondary | ICD-10-CM | POA: Diagnosis not present

## 2012-05-09 DIAGNOSIS — Z01810 Encounter for preprocedural cardiovascular examination: Secondary | ICD-10-CM | POA: Diagnosis not present

## 2012-05-09 DIAGNOSIS — K8010 Calculus of gallbladder with chronic cholecystitis without obstruction: Secondary | ICD-10-CM | POA: Diagnosis not present

## 2012-05-09 LAB — SURGICAL PCR SCREEN
MRSA, PCR: NEGATIVE
Staphylococcus aureus: NEGATIVE

## 2012-05-09 LAB — CBC WITH DIFFERENTIAL/PLATELET
Basophils Relative: 1 % (ref 0–1)
Eosinophils Absolute: 0.1 10*3/uL (ref 0.0–0.7)
Hemoglobin: 13.3 g/dL (ref 13.0–17.0)
MCHC: 32.9 g/dL (ref 30.0–36.0)
Monocytes Relative: 8 % (ref 3–12)
Neutro Abs: 2.3 10*3/uL (ref 1.7–7.7)
Neutrophils Relative %: 50 % (ref 43–77)
Platelets: 302 10*3/uL (ref 150–400)
RDW: 12.8 % (ref 11.5–15.5)

## 2012-05-09 LAB — BASIC METABOLIC PANEL
BUN: 6 mg/dL (ref 6–23)
CO2: 33 mEq/L — ABNORMAL HIGH (ref 19–32)
Calcium: 10.2 mg/dL (ref 8.4–10.5)
Creatinine, Ser: 0.93 mg/dL (ref 0.50–1.35)
Glucose, Bld: 87 mg/dL (ref 70–99)

## 2012-05-09 LAB — HEPATIC FUNCTION PANEL
Albumin: 4.3 g/dL (ref 3.5–5.2)
Indirect Bilirubin: 0.3 mg/dL (ref 0.3–0.9)
Total Bilirubin: 0.4 mg/dL (ref 0.3–1.2)

## 2012-05-09 NOTE — Progress Notes (Signed)
05/09/12 1028  OBSTRUCTIVE SLEEP APNEA  Have you ever been diagnosed with sleep apnea through a sleep study? No  Do you snore loudly (loud enough to be heard through closed doors)?  0  Do you often feel tired, fatigued, or sleepy during the daytime? 1  Has anyone observed you stop breathing during your sleep? 1  Do you have, or are you being treated for high blood pressure? 0  BMI more than 35 kg/m2? 0  Age over 54 years old? 1  Neck circumference greater than 40 cm/18 inches? 1  Gender: 1  Obstructive Sleep Apnea Score 5   Score 4 or greater  Results sent to PCP

## 2012-05-10 ENCOUNTER — Ambulatory Visit (INDEPENDENT_AMBULATORY_CARE_PROVIDER_SITE_OTHER): Payer: BC Managed Care – PPO | Admitting: Gastroenterology

## 2012-05-10 ENCOUNTER — Encounter: Payer: Self-pay | Admitting: Gastroenterology

## 2012-05-10 VITALS — BP 111/71 | HR 85 | Temp 98.5°F | Ht 72.0 in | Wt 227.2 lb

## 2012-05-10 DIAGNOSIS — K859 Acute pancreatitis without necrosis or infection, unspecified: Secondary | ICD-10-CM

## 2012-05-10 DIAGNOSIS — K851 Biliary acute pancreatitis without necrosis or infection: Secondary | ICD-10-CM | POA: Insufficient documentation
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
## 2012-05-10 NOTE — Assessment & Plan Note (Signed)
52 year old male with recent hospitalization for gallstone pancreatitis, admitting lipase greater than 3000, with significant improvement noted with fluid resuscitation and pain control. He returns today outpatient, with continued overall improvement. He does note one episode of epigastric discomfort last evening after eating brown rice, chicken, grilled onions and peppers. I reviewed with him adhering to a low-fat, mainly clear liquid diet until the cholecystectomy is performed. He is aware of the rationale behind this. Thus far, his LFTs have completely normalized. CBC normal. Lipase will be drawn today, as well as PT/INR in preparation of upcoming procedure.   Lipase today Continue low-fat diet Surveillance colonoscopy 2017 Follow-up prn

## 2012-05-10 NOTE — Patient Instructions (Addendum)
Please have additional blood work done. I'm sorry this was not completed yesterday!   Review the low-fat diet. This should be helpful in knowing what you may eat in the meantime.      Fat and Cholesterol Control Diet Cholesterol levels in your body are determined significantly by your diet. Cholesterol levels may also be related to heart disease. The following material helps to explain this relationship and discusses what you can do to help keep your heart healthy. Not all cholesterol is bad. Low-density lipoprotein (LDL) cholesterol is the "bad" cholesterol. It may cause fatty deposits to build up inside your arteries. High-density lipoprotein (HDL) cholesterol is "good." It helps to remove the "bad" LDL cholesterol from your blood. Cholesterol is a very important risk factor for heart disease. Other risk factors are high blood pressure, smoking, stress, heredity, and weight. The heart muscle gets its supply of blood through the coronary arteries. If your LDL cholesterol is high and your HDL cholesterol is low, you are at risk for having fatty deposits build up in your coronary arteries. This leaves less room through which blood can flow. Without sufficient blood and oxygen, the heart muscle cannot function properly and you may feel chest pains (angina pectoris). When a coronary artery closes up entirely, a part of the heart muscle may die causing a heart attack (myocardial infarction). CHECKING CHOLESTEROL When your caregiver sends your blood to a lab to be examined for cholesterol, a complete lipid (fat) profile may be done. With this test, the total amount of cholesterol and levels of LDL and HDL are determined. Triglycerides are a type of fat that circulates in the blood. They can also be used to determine heart disease risk. The list below describes what the numbers should be: Test: Total Cholesterol.  Less than 200 mg/dl.  Test: LDL "bad cholesterol."  Less than 100 mg/dl.   Less than 70  mg/dl if you are at very high risk of a heart attack or sudden cardiac death.  Test: HDL "good cholesterol."  Greater than 50 mg/dl for women.   Greater than 40 mg/dl for men.  Test: Triglycerides.  Less than 150 mg/dl.  CONTROLLING CHOLESTEROL WITH DIET Although exercise and lifestyle factors are important, your diet is key. That is because certain foods are known to raise cholesterol and others to lower it. The goal is to balance foods for their effect on cholesterol and more importantly, to replace saturated and trans fat with other types of fat, such as monounsaturated fat, polyunsaturated fat, and omega-3 fatty acids. On average, a person should consume no more than 15 to 17 g of saturated fat daily. Saturated and trans fats are considered "bad" fats, and they will raise LDL cholesterol. Saturated fats are primarily found in animal products such as meats, butter, and cream. However, that does not mean you need to give up all your favorite foods. Today, there are good tasting, low-fat, low-cholesterol substitutes for most of the things you like to eat. Choose low-fat or nonfat alternatives. Choose round or loin cuts of red meat. These types of cuts are lowest in fat and cholesterol. Chicken (without the skin), fish, veal, and ground [place] breast are great choices. Eliminate fatty meats, such as hot dogs and salami. Even shellfish have little or no saturated fat. Have a 3 oz (85 g) portion when you eat lean meat, poultry, or fish. Trans fats are also called "partially hydrogenated oils." They are oils that have been scientifically manipulated so that they are  solid at room temperature resulting in a longer shelf life and improved taste and texture of foods in which they are added. Trans fats are found in stick margarine, some tub margarines, cookies, crackers, and baked goods.   When baking and cooking, oils are a great substitute for butter. The monounsaturated oils are especially beneficial since  it is believed they lower LDL and raise HDL. The oils you should avoid entirely are saturated tropical oils, such as coconut and palm.   Remember to eat a lot from food groups that are naturally free of saturated and trans fat, including fish, fruit, vegetables, beans, grains (barley, rice, couscous, bulgur wheat), and pasta (without cream sauces).   IDENTIFYING FOODS THAT LOWER CHOLESTEROL   Soluble fiber may lower your cholesterol. This type of fiber is found in fruits such as apples, vegetables such as broccoli, potatoes, and carrots, legumes such as beans, peas, and lentils, and grains such as barley. Foods fortified with plant sterols (phytosterol) may also lower cholesterol. You should eat at least 2 g per day of these foods for a cholesterol lowering effect.   Read package labels to identify low-saturated fats, trans fat free, and low-fat foods at the supermarket. Select cheeses that have only 2 to 3 g saturated fat per ounce. Use a heart-healthy tub margarine that is free of trans fats or partially hydrogenated oil. When buying baked goods (cookies, crackers), avoid partially hydrogenated oils. Breads and muffins should be made from whole grains (whole-wheat or whole oat flour, instead of "flour" or "enriched flour"). Buy non-creamy canned soups with reduced salt and no added fats.   FOOD PREPARATION TECHNIQUES   Never deep-fry. If you must fry, either stir-fry, which uses very little fat, or use non-stick cooking sprays. When possible, broil, bake, or roast meats, and steam vegetables. Instead of putting butter or margarine on vegetables, use lemon and herbs, applesauce, and cinnamon (for squash and sweet potatoes), nonfat yogurt, salsa, and low-fat dressings for salads.   LOW-SATURATED FAT / LOW-FAT FOOD SUBSTITUTES Meats / Saturated Fat (g)  Avoid: Steak, marbled (3 oz/85 g) / 11 g   Choose: Steak, lean (3 oz/85 g) / 4 g   Avoid: Hamburger (3 oz/85 g) / 7 g   Choose: Hamburger, lean (3  oz/85 g) / 5 g   Avoid: Ham (3 oz/85 g) / 6 g   Choose: Ham, lean cut (3 oz/85 g) / 2.4 g   Avoid: Chicken, with skin, dark meat (3 oz/85 g) / 4 g   Choose: Chicken, skin removed, dark meat (3 oz/85 g) / 2 g   Avoid: Chicken, with skin, light meat (3 oz/85 g) / 2.5 g   Choose: Chicken, skin removed, light meat (3 oz/85 g) / 1 g  Dairy / Saturated Fat (g)  Avoid: Whole milk (1 cup) / 5 g   Choose: Low-fat milk, 2% (1 cup) / 3 g   Choose: Low-fat milk, 1% (1 cup) / 1.5 g   Choose: Skim milk (1 cup) / 0.3 g   Avoid: Hard cheese (1 oz/28 g) / 6 g   Choose: Skim milk cheese (1 oz/28 g) / 2 to 3 g   Avoid: Cottage cheese, 4% fat (1 cup) / 6.5 g   Choose: Low-fat cottage cheese, 1% fat (1 cup) / 1.5 g   Avoid: Ice cream (1 cup) / 9 g   Choose: Sherbet (1 cup) / 2.5 g   Choose: Nonfat frozen yogurt (1 cup) / 0.3 g  Choose: Frozen fruit bar / trace   Avoid: Whipped cream (1 tbs) / 3.5 g   Choose: Nondairy whipped topping (1 tbs) / 1 g  Condiments / Saturated Fat (g)  Avoid: Mayonnaise (1 tbs) / 2 g   Choose: Low-fat mayonnaise (1 tbs) / 1 g   Avoid: Butter (1 tbs) / 7 g   Choose: Extra light margarine (1 tbs) / 1 g   Avoid: Coconut oil (1 tbs) / 11.8 g   Choose: Olive oil (1 tbs) / 1.8 g   Choose: Corn oil (1 tbs) / 1.7 g   Choose: Safflower oil (1 tbs) / 1.2 g   Choose: Sunflower oil (1 tbs) / 1.4 g   Choose: Soybean oil (1 tbs) / 2.4 g   Choose: Canola oil (1 tbs) / 1 g  Document Released: 03/23/2005 Document Revised: 06/15/2011 Document Reviewed: 09/11/2010 [facility] Patient Information 2013 [place], [place].

## 2012-05-10 NOTE — Progress Notes (Signed)
Faxed to PCP

## 2012-05-10 NOTE — Progress Notes (Unsigned)
Please make sure patient is on the recall list for surveillance TCS in 2017. Thanks!

## 2012-05-11 ENCOUNTER — Telehealth: Payer: Self-pay | Admitting: Internal Medicine

## 2012-05-11 LAB — PROTIME-INR: Prothrombin Time: 12.8 seconds (ref 11.6–15.2)

## 2012-05-11 NOTE — Progress Notes (Signed)
Quick Note:  Noted.  Lipase completely normal. INR normal. Pt seen 2/4 in follow-up after admission for gallstone pancreatitis. Plans for cholecystectomy by Dr. Malvin Johns on Friday. Will cc Dr. Malvin Johns and Dr. Jena Gauss.  Pt has already completed CBC, BMP, HFP, all normal.    ______

## 2012-05-11 NOTE — Progress Notes (Signed)
Pt is on the recall for Dec 2017 for his next tcs

## 2012-05-11 NOTE — Telephone Encounter (Signed)
I spoke to Dr. Malvin Johns via telephone today. Recent office visit reviewed. LFTs and lipase all normal. His pancreatitis has resolved. He is good to go for a cholecystectomy in the near future.

## 2012-05-13 ENCOUNTER — Encounter (HOSPITAL_COMMUNITY)
Admission: RE | Disposition: A | Discharge status: Home / Self Care | Payer: Self-pay | Source: Ambulatory Visit | Attending: General Surgery

## 2012-05-13 ENCOUNTER — Encounter (HOSPITAL_COMMUNITY): Payer: Self-pay | Admitting: Anesthesiology

## 2012-05-13 ENCOUNTER — Encounter (HOSPITAL_COMMUNITY): Payer: Self-pay | Admitting: *Deleted

## 2012-05-13 ENCOUNTER — Observation Stay (HOSPITAL_COMMUNITY)
Admission: RE | Admit: 2012-05-13 | Discharge: 2012-05-13 | Disposition: A | Discharge status: Home / Self Care | Payer: BC Managed Care – PPO | Source: Ambulatory Visit | Attending: General Surgery | Admitting: General Surgery

## 2012-05-13 ENCOUNTER — Ambulatory Visit (HOSPITAL_COMMUNITY): Payer: BC Managed Care – PPO | Admitting: Anesthesiology

## 2012-05-13 DIAGNOSIS — K810 Acute cholecystitis: Secondary | ICD-10-CM

## 2012-05-13 DIAGNOSIS — Z01810 Encounter for preprocedural cardiovascular examination: Secondary | ICD-10-CM | POA: Diagnosis not present

## 2012-05-13 DIAGNOSIS — K8010 Calculus of gallbladder with chronic cholecystitis without obstruction: Principal | ICD-10-CM | POA: Insufficient documentation

## 2012-05-13 DIAGNOSIS — T888XXA Other specified complications of surgical and medical care, not elsewhere classified, initial encounter: Secondary | ICD-10-CM

## 2012-05-13 DIAGNOSIS — K219 Gastro-esophageal reflux disease without esophagitis: Secondary | ICD-10-CM

## 2012-05-13 DIAGNOSIS — K859 Acute pancreatitis without necrosis or infection, unspecified: Secondary | ICD-10-CM

## 2012-05-13 DIAGNOSIS — Z01812 Encounter for preprocedural laboratory examination: Secondary | ICD-10-CM | POA: Insufficient documentation

## 2012-05-13 DIAGNOSIS — M47816 Spondylosis without myelopathy or radiculopathy, lumbar region: Secondary | ICD-10-CM

## 2012-05-13 DIAGNOSIS — K851 Biliary acute pancreatitis without necrosis or infection: Secondary | ICD-10-CM

## 2012-05-13 DIAGNOSIS — K5900 Constipation, unspecified: Secondary | ICD-10-CM

## 2012-05-13 DIAGNOSIS — E781 Pure hyperglyceridemia: Secondary | ICD-10-CM

## 2012-05-13 HISTORY — PX: CHOLECYSTECTOMY: SHX55

## 2012-05-13 SURGERY — LAPAROSCOPIC CHOLECYSTECTOMY
Anesthesia: General | Site: Abdomen | Wound class: Clean Contaminated

## 2012-05-13 MED ORDER — METHOCARBAMOL 500 MG PO TABS: 500.0000 mg | ORAL_TABLET | Freq: Every evening | ORAL | Status: DC | PRN

## 2012-05-13 MED ORDER — ONDANSETRON HCL 4 MG/2ML IJ SOLN
INTRAMUSCULAR | Status: AC
  Filled 2012-05-13: qty 2

## 2012-05-13 MED ORDER — FENTANYL CITRATE 0.05 MG/ML IJ SOLN
INTRAMUSCULAR | Status: DC | PRN
  Administered 2012-05-13: 100 ug via INTRAVENOUS
  Administered 2012-05-13 (×3): 50 ug via INTRAVENOUS

## 2012-05-13 MED ORDER — PREGABALIN 75 MG PO CAPS: 150.0000 mg | ORAL_CAPSULE | Freq: Two times a day (BID) | ORAL | Status: DC

## 2012-05-13 MED ORDER — 0.9 % SODIUM CHLORIDE (POUR BTL) OPTIME
TOPICAL | Status: DC | PRN
  Administered 2012-05-13: 1000 mL

## 2012-05-13 MED ORDER — ROCURONIUM BROMIDE 50 MG/5ML IV SOLN
INTRAVENOUS | Status: AC
  Filled 2012-05-13: qty 1

## 2012-05-13 MED ORDER — EPHEDRINE SULFATE 50 MG/ML IJ SOLN
INTRAMUSCULAR | Status: AC
  Filled 2012-05-13: qty 1

## 2012-05-13 MED ORDER — GLYCOPYRROLATE 0.2 MG/ML IJ SOLN
0.2000 mg | Freq: Once | INTRAMUSCULAR | Status: AC
  Administered 2012-05-13: 0.2 mg via INTRAVENOUS

## 2012-05-13 MED ORDER — DEXAMETHASONE SODIUM PHOSPHATE 4 MG/ML IJ SOLN
4.0000 mg | Freq: Once | INTRAMUSCULAR | Status: AC
  Administered 2012-05-13: 4 mg via INTRAVENOUS

## 2012-05-13 MED ORDER — PANTOPRAZOLE SODIUM 40 MG PO TBEC: 40.0000 mg | DELAYED_RELEASE_TABLET | Freq: Every day | ORAL | Status: DC

## 2012-05-13 MED ORDER — ONDANSETRON HCL 4 MG PO TABS: 4.0000 mg | ORAL_TABLET | Freq: Four times a day (QID) | ORAL | Status: DC | PRN

## 2012-05-13 MED ORDER — MORPHINE SULFATE 2 MG/ML IJ SOLN: 2.0000 mg | INTRAMUSCULAR | Status: DC | PRN

## 2012-05-13 MED ORDER — FENTANYL CITRATE 0.05 MG/ML IJ SOLN
INTRAMUSCULAR | Status: AC
  Filled 2012-05-13: qty 2

## 2012-05-13 MED ORDER — EPHEDRINE SULFATE 50 MG/ML IJ SOLN
INTRAMUSCULAR | Status: DC | PRN
  Administered 2012-05-13 (×3): 5 mg via INTRAVENOUS

## 2012-05-13 MED ORDER — FENTANYL CITRATE 0.05 MG/ML IJ SOLN
25.0000 ug | INTRAMUSCULAR | Status: DC | PRN
  Administered 2012-05-13 (×4): 50 ug via INTRAVENOUS

## 2012-05-13 MED ORDER — PHENYLEPHRINE HCL 10 MG/ML IJ SOLN
INTRAMUSCULAR | Status: DC | PRN
  Administered 2012-05-13 (×2): 100 ug via INTRAVENOUS

## 2012-05-13 MED ORDER — SUCCINYLCHOLINE CHLORIDE 20 MG/ML IJ SOLN
INTRAMUSCULAR | Status: DC | PRN
  Administered 2012-05-13: 120 mg via INTRAVENOUS

## 2012-05-13 MED ORDER — ROCURONIUM BROMIDE 100 MG/10ML IV SOLN
INTRAVENOUS | Status: DC | PRN
  Administered 2012-05-13: 5 mg via INTRAVENOUS
  Administered 2012-05-13: 10 mg via INTRAVENOUS
  Administered 2012-05-13: 25 mg via INTRAVENOUS

## 2012-05-13 MED ORDER — POTASSIUM CHLORIDE IN NACL 20-0.9 MEQ/L-% IV SOLN
INTRAVENOUS | Status: DC
  Administered 2012-05-13: 13:00:00 via INTRAVENOUS

## 2012-05-13 MED ORDER — DEXAMETHASONE SODIUM PHOSPHATE 4 MG/ML IJ SOLN
INTRAMUSCULAR | Status: AC
  Filled 2012-05-13: qty 1

## 2012-05-13 MED ORDER — DEXTROSE 5 % IV SOLN
1.0000 g | Freq: Once | INTRAVENOUS | Status: AC
  Administered 2012-05-13: 1 g via INTRAVENOUS

## 2012-05-13 MED ORDER — PROPOFOL 10 MG/ML IV EMUL
INTRAVENOUS | Status: AC
  Filled 2012-05-13: qty 20

## 2012-05-13 MED ORDER — GLYCOPYRROLATE 0.2 MG/ML IJ SOLN
INTRAMUSCULAR | Status: DC | PRN
  Administered 2012-05-13 (×2): 0.2 mg via INTRAVENOUS
  Administered 2012-05-13: 0.4 mg via INTRAVENOUS

## 2012-05-13 MED ORDER — ONDANSETRON HCL 4 MG/2ML IJ SOLN: 4.0000 mg | Freq: Four times a day (QID) | INTRAMUSCULAR | Status: DC | PRN

## 2012-05-13 MED ORDER — MIDAZOLAM HCL 2 MG/2ML IJ SOLN
1.0000 mg | INTRAMUSCULAR | Status: DC | PRN
  Administered 2012-05-13 (×2): 2 mg via INTRAVENOUS

## 2012-05-13 MED ORDER — MIDAZOLAM HCL 2 MG/2ML IJ SOLN
INTRAMUSCULAR | Status: AC
  Filled 2012-05-13: qty 2

## 2012-05-13 MED ORDER — SUCCINYLCHOLINE CHLORIDE 20 MG/ML IJ SOLN
INTRAMUSCULAR | Status: AC
  Filled 2012-05-13: qty 1

## 2012-05-13 MED ORDER — LIDOCAINE HCL (PF) 1 % IJ SOLN
INTRAMUSCULAR | Status: AC
  Filled 2012-05-13: qty 5

## 2012-05-13 MED ORDER — PHENYLEPHRINE HCL 10 MG/ML IJ SOLN
INTRAMUSCULAR | Status: AC
  Filled 2012-05-13: qty 1

## 2012-05-13 MED ORDER — BUPIVACAINE HCL (PF) 0.5 % IJ SOLN
INTRAMUSCULAR | Status: DC | PRN
  Administered 2012-05-13: 8 mL

## 2012-05-13 MED ORDER — NEOSTIGMINE METHYLSULFATE 1 MG/ML IJ SOLN
INTRAMUSCULAR | Status: DC | PRN
  Administered 2012-05-13: 2 mg via INTRAVENOUS

## 2012-05-13 MED ORDER — PROPOFOL 10 MG/ML IV BOLUS
INTRAVENOUS | Status: DC | PRN
  Administered 2012-05-13: 150 mg via INTRAVENOUS

## 2012-05-13 MED ORDER — SODIUM CHLORIDE 0.9 % IR SOLN
Status: DC | PRN
  Administered 2012-05-13: 3000 mL

## 2012-05-13 MED ORDER — BUPIVACAINE HCL (PF) 0.5 % IJ SOLN
INTRAMUSCULAR | Status: AC
  Filled 2012-05-13: qty 30

## 2012-05-13 MED ORDER — WATER FOR IRRIGATION, STERILE IR SOLN
Status: DC | PRN
  Administered 2012-05-13 (×2): 1000 mL

## 2012-05-13 MED ORDER — NEOSTIGMINE METHYLSULFATE 1 MG/ML IJ SOLN
INTRAMUSCULAR | Status: AC
  Filled 2012-05-13: qty 1

## 2012-05-13 MED ORDER — MORPHINE SULFATE 2 MG/ML IJ SOLN
1.0000 mg | INTRAMUSCULAR | Status: DC | PRN
  Administered 2012-05-13: 1 mg via INTRAVENOUS
  Filled 2012-05-13: qty 1

## 2012-05-13 MED ORDER — LACTATED RINGERS IV SOLN
INTRAVENOUS | Status: DC
  Administered 2012-05-13 (×2): via INTRAVENOUS

## 2012-05-13 MED ORDER — FENTANYL CITRATE 0.05 MG/ML IJ SOLN
INTRAMUSCULAR | Status: AC
  Filled 2012-05-13: qty 5

## 2012-05-13 MED ORDER — GLYCOPYRROLATE 0.2 MG/ML IJ SOLN
INTRAMUSCULAR | Status: AC
  Filled 2012-05-13: qty 1

## 2012-05-13 MED ORDER — GLYCOPYRROLATE 0.2 MG/ML IJ SOLN
INTRAMUSCULAR | Status: AC
  Filled 2012-05-13: qty 2

## 2012-05-13 MED ORDER — ONDANSETRON HCL 4 MG/2ML IJ SOLN: 4.0000 mg | Freq: Once | INTRAMUSCULAR | Status: DC | PRN

## 2012-05-13 MED ORDER — ONDANSETRON HCL 4 MG/2ML IJ SOLN
4.0000 mg | Freq: Once | INTRAMUSCULAR | Status: AC
  Administered 2012-05-13: 4 mg via INTRAVENOUS

## 2012-05-13 MED ORDER — LIDOCAINE HCL (CARDIAC) 10 MG/ML IV SOLN
INTRAVENOUS | Status: DC | PRN
  Administered 2012-05-13: 20 mg via INTRAVENOUS

## 2012-05-13 MED ORDER — DEXTROSE 5 % IV SOLN
INTRAVENOUS | Status: AC
  Filled 2012-05-13: qty 10

## 2012-05-13 MED ORDER — HEMOSTATIC AGENTS (NO CHARGE) OPTIME
TOPICAL | Status: DC | PRN
  Administered 2012-05-13: 1 via TOPICAL

## 2012-05-13 SURGICAL SUPPLY — 69 items
APPLICATOR COTTON TIP 6IN STRL (MISCELLANEOUS) IMPLANT
APPLIER CLIP LAPSCP 10X32 DD (CLIP) ×2 IMPLANT
ATTRACTOMAT 16X20 MAGNETIC DRP (DRAPES) IMPLANT
BAG HAMPER (MISCELLANEOUS) ×2 IMPLANT
BLADE SURG 15 STRL LF DISP TIS (BLADE) ×1 IMPLANT
BLADE SURG 15 STRL SS (BLADE) ×1
BLADE SURG SZ10 CARB STEEL (BLADE) IMPLANT
CLOTH BEACON ORANGE TIMEOUT ST (SAFETY) ×2 IMPLANT
COVER LIGHT HANDLE STERIS (MISCELLANEOUS) ×4 IMPLANT
DECANTER SPIKE VIAL GLASS SM (MISCELLANEOUS) ×2 IMPLANT
DISSECTOR BLUNT TIP ENDO 5MM (MISCELLANEOUS) ×2 IMPLANT
DRAPE WARM FLUID 44X44 (DRAPE) IMPLANT
DRSG TEGADERM 2-3/8X2-3/4 SM (GAUZE/BANDAGES/DRESSINGS) ×4 IMPLANT
DURAPREP 26ML APPLICATOR (WOUND CARE) ×2 IMPLANT
ELECT BLADE 6 FLAT ULTRCLN (ELECTRODE) IMPLANT
ELECT REM PT RETURN 9FT ADLT (ELECTROSURGICAL) ×2
ELECTRODE REM PT RTRN 9FT ADLT (ELECTROSURGICAL) ×1 IMPLANT
EVACUATOR DRAINAGE 10X20 100CC (DRAIN) ×1 IMPLANT
EVACUATOR SILICONE 100CC (DRAIN) ×1
FILTER SMOKE EVAC LAPAROSHD (FILTER) ×2 IMPLANT
FORMALIN 10 PREFIL 120ML (MISCELLANEOUS) ×2 IMPLANT
GLOVE BIOGEL PI IND STRL 7.0 (GLOVE) ×2 IMPLANT
GLOVE BIOGEL PI IND STRL 7.5 (GLOVE) ×1 IMPLANT
GLOVE BIOGEL PI INDICATOR 7.0 (GLOVE) ×2
GLOVE BIOGEL PI INDICATOR 7.5 (GLOVE) ×1
GLOVE ECLIPSE 6.5 STRL STRAW (GLOVE) ×2 IMPLANT
GLOVE ECLIPSE 7.0 STRL STRAW (GLOVE) ×4 IMPLANT
GLOVE EXAM NITRILE MD LF STRL (GLOVE) ×2 IMPLANT
GLOVE INDICATOR 8.0 STRL GRN (GLOVE) ×2 IMPLANT
GLOVE SKINSENSE NS SZ7.0 (GLOVE) ×1
GLOVE SKINSENSE STRL SZ7.0 (GLOVE) ×1 IMPLANT
GLOVE SS BIOGEL STRL SZ 8 (GLOVE) ×1 IMPLANT
GLOVE SUPERSENSE BIOGEL SZ 8 (GLOVE) ×1
GOWN STRL REIN XL XLG (GOWN DISPOSABLE) ×6 IMPLANT
HEMOSTAT SURGICEL 4X8 (HEMOSTASIS) ×2 IMPLANT
INST SET LAPROSCOPIC AP (KITS) ×2 IMPLANT
IV NS IRRIG 3000ML ARTHROMATIC (IV SOLUTION) ×2 IMPLANT
KIT ROOM TURNOVER APOR (KITS) ×2 IMPLANT
KIT TROCAR LAP CHOLE (TROCAR) ×2 IMPLANT
MANIFOLD NEPTUNE II (INSTRUMENTS) ×2 IMPLANT
NS IRRIG 1000ML POUR BTL (IV SOLUTION) ×2 IMPLANT
PACK LAP CHOLE LZT030E (CUSTOM PROCEDURE TRAY) ×2 IMPLANT
PAD ARMBOARD 7.5X6 YLW CONV (MISCELLANEOUS) ×2 IMPLANT
PENCIL HANDSWITCHING (ELECTRODE) IMPLANT
POUCH SPECIMEN RETRIEVAL 10MM (ENDOMECHANICALS) ×2 IMPLANT
SET BASIN LINEN APH (SET/KITS/TRAYS/PACK) ×2 IMPLANT
SET TUBE IRRIG SUCTION NO TIP (IRRIGATION / IRRIGATOR) ×2 IMPLANT
SOL PREP PROV IODINE SCRUB 4OZ (MISCELLANEOUS) ×2 IMPLANT
SPONGE DRAIN TRACH 4X4 STRL 2S (GAUZE/BANDAGES/DRESSINGS) ×2 IMPLANT
SPONGE GAUZE 4X4 12PLY (GAUZE/BANDAGES/DRESSINGS) ×2 IMPLANT
SPONGE INTESTINAL PEANUT (DISPOSABLE) IMPLANT
SPONGE LAP 18X18 X RAY DECT (DISPOSABLE) IMPLANT
STAPLER VISISTAT 35W (STAPLE) ×2 IMPLANT
SUT ETHILON 3 0 FSL (SUTURE) ×2 IMPLANT
SUT SILK 2 0 (SUTURE)
SUT SILK 2 0 SH (SUTURE) IMPLANT
SUT SILK 2-0 18XBRD TIE 12 (SUTURE) IMPLANT
SUT SILK 3 0 SH CR/8 (SUTURE) IMPLANT
SUT VIC AB 0 CT1 27 (SUTURE)
SUT VIC AB 0 CT1 27XBRD ANTBC (SUTURE) IMPLANT
SUT VIC AB 0 CT1 27XCR 8 STRN (SUTURE) IMPLANT
SUT VICRYL 0 UR6 27IN ABS (SUTURE) ×2 IMPLANT
SYR BULB IRRIGATION 50ML (SYRINGE) IMPLANT
TAPE CLOTH SURG 4X10 WHT LF (GAUZE/BANDAGES/DRESSINGS) ×2 IMPLANT
TOWEL OR 17X26 4PK STRL BLUE (TOWEL DISPOSABLE) IMPLANT
TRAY FOLEY CATH 14FR (SET/KITS/TRAYS/PACK) ×2 IMPLANT
WARMER LAPAROSCOPE (MISCELLANEOUS) ×2 IMPLANT
WATER STERILE IRR 1000ML POUR (IV SOLUTION) ×4 IMPLANT
YANKAUER SUCT BULB TIP 10FT TU (MISCELLANEOUS) IMPLANT

## 2012-05-13 NOTE — Progress Notes (Signed)
51 yr. Old W.male with recent history of gall stone pancreatitis. His lipase and LFS have trended to normal and he has been cleared by GI for cholecystectomy.  I performed a distal pancreatectomy on him years ago; hopefully we will still be able to do this surgery laparoscopically  Procedure and risks explained and informed consent obtained.. Labs reviewed.Ceasar Mons Vitals:   05/13/12 0740  BP: 116/73  Pulse:   Temp:   Resp: 16  temp 98.2 HR 58  No clinical change in H&P.  Dict.# [id number].

## 2012-05-13 NOTE — Progress Notes (Signed)
UR Chart Review Completed  

## 2012-05-13 NOTE — Anesthesia Procedure Notes (Signed)
Procedure Name: Intubation Date/Time: 05/13/2012 8:05 AM Performed by: Franco Nones Pre-anesthesia Checklist: Patient identified, Patient being monitored, Timeout performed, Emergency Drugs available and Suction available Patient Re-evaluated:Patient Re-evaluated prior to inductionOxygen Delivery Method: Circle System Utilized Preoxygenation: Pre-oxygenation with 100% oxygen Intubation Type: IV induction, Rapid sequence and Cricoid Pressure applied Ventilation: Mask ventilation without difficulty Laryngoscope Size: Miller and 2 Grade View: Grade I Tube type: Oral Tube size: 7.0 mm Number of attempts: 1 Airway Equipment and Method: stylet Placement Confirmation: ETT inserted through vocal cords under direct vision,  positive ETCO2 and breath sounds checked- equal and bilateral Secured at: 22 cm Tube secured with: Tape Dental Injury: Teeth and Oropharynx as per pre-operative assessment

## 2012-05-13 NOTE — Brief Op Note (Signed)
05/13/2012  9:14 AM  PATIENT:  Derek [name]  52 y.o. male  PRE-OPERATIVE DIAGNOSIS:  cholelithiasis, cholecystitis  POST-OPERATIVE DIAGNOSIS:  cholelithiasis, cholecystitis  PROCEDURE:  Procedure(s) (LRB) with comments: LAPAROSCOPIC CHOLECYSTECTOMY (N/A)  SURGEON:  Surgeon(s) and Role:    * Marlane Hatcher, MD - Primary  PHYSICIAN ASSISTANT:   ASSISTANTS: none   ANESTHESIA:   general  EBL:  Total I/O In: 1500 [I.V.:1500] Out: 820 [Urine:800; Blood:20]  BLOOD ADMINISTERED:none  DRAINS: (liver bed.) Jackson-Pratt drain(s) with closed bulb suction in the liver bed.   LOCAL MEDICATIONS USED:  MARCAINE  0.5% ~ 10cc.  SPECIMEN:  Source of Specimen:  galll bladder and stones.  DISPOSITION OF SPECIMEN:  PATHOLOGY  COUNTS:  YES  TOURNIQUET:  * No tourniquets in log *  DICTATION: .Other Dictation: Dictation Number OR dict. # [id number].  PLAN OF CARE: Admit for overnight observation  PATIENT DISPOSITION:  PACU - hemodynamically stable.   Delay start of Pharmacological VTE agent (>24hrs) due to surgical blood loss or risk of bleeding: not applicable

## 2012-05-13 NOTE — Anesthesia Preprocedure Evaluation (Signed)
Anesthesia Evaluation  Patient identified by MRN, date of birth, ID band Patient awake    Reviewed: Allergy & Precautions, H&P , NPO status , Patient's Chart, lab work & pertinent test results  History of Anesthesia Complications Negative for: history of anesthetic complications  Airway Mallampati: II TM Distance: <3 FB     Dental  (+) Teeth Intact   Pulmonary former smoker,  breath sounds clear to auscultation        Cardiovascular negative cardio ROS  Rhythm:Regular Rate:Normal     Neuro/Psych    GI/Hepatic GERD-  ,  Endo/Other    Renal/GU Renal disease (solitary L kidney)     Musculoskeletal  (+) Arthritis - (Lumbar Fusion, chronic LBP),   Abdominal   Peds  Hematology   Anesthesia Other Findings   Reproductive/Obstetrics                           Anesthesia Physical Anesthesia Plan  ASA: II  Anesthesia Plan: General   Post-op Pain Management:    Induction: Intravenous, Rapid sequence and Cricoid pressure planned  Airway Management Planned: Oral ETT  Additional Equipment:   Intra-op Plan:   Post-operative Plan: Extubation in OR  Informed Consent: I have reviewed the patients History and Physical, chart, labs and discussed the procedure including the risks, benefits and alternatives for the proposed anesthesia with the patient or authorized representative who has indicated his/her understanding and acceptance.     Plan Discussed with:   Anesthesia Plan Comments:         Anesthesia Quick Evaluation

## 2012-05-13 NOTE — Op Note (Signed)
NAME:  Derek [name], DIMARIA NO.:  [id number]  MEDICAL RECORD NO.:  [id number]  LOCATION:  [facility]                          FACILITY:  [facility]  PHYSICIAN:  Barbaraann Barthel, M.D. DATE OF BIRTH:  April 03, 1961  DATE OF PROCEDURE:  05/13/2012 DATE OF DISCHARGE:                              OPERATIVE REPORT   DIAGNOSIS: 1. Status post gallstone pancreatitis. 2. Cholelithiasis.  PROCEDURE:  Laparoscopic cholecystectomy.  NOTE:  This is a 52 year old white male who came to the emergency room with abdominal pain, and was found to have gallstone pancreatitis.  He saw the GI Service and essentially they treated him nonoperatively until his pancreatic enzymes trended towards normal and his liver function studies also tended towards normal and after his pancreatitis had subsided, a surgical consult was obtained and we planned for outpatient gallbladder surgery.  We discussed complications not limited to, but including bleeding, infection, damage to bile ducts, perforation of organs, transitory diarrhea, and the possibility of open surgery might be required. Informed consent was obtained.  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  He had, had a distal pancreatectomy for cystadenoma in 2003 and we discussed the possibility of laparoscopic cholecystectomy might not be possible, however, intraoperatively there were few adhesions in the right upper quadrant, so that we were able to proceed laparoscopically.  GROSS OPERATIVE FINDINGS:  The patient had fatty infiltration of the gallbladder and the peritoneum, small cystic duct which was not cannulated, and there were no other abnormalities encountered.  TECHNIQUE:  The patient was placed in supine position and after the adequate administration of  general anesthesia via endotracheal intubation, his entire abdomen was prepped with Betadine solution and draped in usual manner.  A periumbilical incision was carried out over the superior aspect of the umbilicus, and we grasped the peritoneum with a sharp towel clip and elevated this and placed a Veress needle, which confirmed position with a saline drop test.  We then insufflated the abdomen with approximately 4 L of CO2 using the Visiport technique and then under direct vision, we placed 11 mm cannula in the epigastrium and two 5 mm cannulas in the right upper quadrant laterally.  The gallbladder was then grasped.  The adhesions were taken down.  The patient had a little vasovagal reaction, which responded to diminishing somewhat the intra-abdominal pressure and lowering his head.  Then we were able to proceed with the surgery without any problem.  The gallbladder was grasped.  Its adhesions were taken down.  The cystic duct was clearly visualized and triply silver clipped and divided as was the cystic artery.  The gallbladder was then removed using the EndoCatch device through the epigastric incision and there was very little oozing from the liver bed.  This was irrigated with normal saline solution and cauterized with cautery device.  I elected to leave Surgicel in the liver bed as well as a Jackson-Pratt drain, which exited through one of the lateral most incisions.  After checking for  hemostasis, the abdomen was then desufflated and the cannula was removed and the surgical incisions were closed in the area of the epigastrium and the umbilicus with 0-Polysorb and 0.5% Sensorcaine was used approximately 10 mL around all the cannula sites for postoperative comfort.  The wounds were then closed with stapling device and the drain was sutured in place with 4-0 nylon.  Prior to closure, all sponge, needle, and instrument counts were found to be correct.  Estimated blood loss was less than  25 mL.  The patient received approximately 1600 mL of crystalloids intraoperatively. There were no complications.  WOUND CLASSIFICATION:  Clean contaminated.  SPECIMEN:  Gallbladder and stones.     Barbaraann Barthel, M.D.     WB/MEDQ  D:  05/13/2012  T:  05/13/2012  Job:  [id number]  cc:   R. Roetta Sessions, MD FACP [facility] [address] [place] [address]  Gypsy Balsam Fax: [phone]  Sharolyn Douglas, M.D. Fax: [phone]

## 2012-05-13 NOTE — Progress Notes (Signed)
Post OP Check  Pt.  Feels great.  abdomen is soft with minimal serous JP drainage,.  Wounds clean and dry Pt has voided, no leg pain or shortness of breath.  Tolerating PO without nausea. Filed Vitals:   05/13/12 1443  BP: 118/63  Pulse: 80  Temp: 97.3 F (36.3 C)  Resp: 18   Will discharge and F/U in office in AM at which time I will remove drain.  Pt.'s wife is ICU nurse and this should not be a problem.

## 2012-05-13 NOTE — Anesthesia Postprocedure Evaluation (Signed)
  Anesthesia Post-op Note  Patient: Derek [name]  Procedure(s) Performed: Procedure(s) (LRB) with comments: LAPAROSCOPIC CHOLECYSTECTOMY (N/A)  Patient Location: pacu  Anesthesia Type:General  Level of Consciousness: awake, oriented and patient cooperative  Airway and Oxygen Therapy: Patient Spontanous Breathing and non-rebreather face mask  Post-op Pain: moderate  Post-op Assessment: Post-op Vital signs reviewed, Patient's Cardiovascular Status Stable, Respiratory Function Stable, Patent Airway and No signs of Nausea or vomiting  Post-op Vital Signs: Reviewed and stable  Complications: No apparent anesthesia complications

## 2012-05-13 NOTE — Transfer of Care (Signed)
Immediate Anesthesia Transfer of Care Note  Patient: Derek [name]  Procedure(s) Performed: Procedure(s) (LRB): LAPAROSCOPIC CHOLECYSTECTOMY (N/A)  Patient Location: PACU  Anesthesia Type: General  Level of Consciousness: awake  Airway & Oxygen Therapy: Patient Spontanous Breathing and non-rebreather face mask  Post-op Assessment: Report given to PACU RN, Post -op Vital signs reviewed and stable and Patient moving all extremities  Post vital signs: Reviewed and stable  Complications: No apparent anesthesia complications

## 2012-05-13 NOTE — Progress Notes (Signed)
Discharge instructions given, verbalized understanding, out in stable condition with staff, JP drain intact.

## 2012-05-13 NOTE — Progress Notes (Signed)
Nutrition Brief Note  Patient identified on the Malnutrition Screening Tool (MST) Report.   Wt Readings from Last 10 Encounters:  05/10/12 227 lb 3.2 oz (103.057 kg)  05/09/12 222 lb (100.699 kg)  04/20/12 225 lb 5 oz (102.2 kg)  03/25/11 235 lb (106.595 kg)  03/25/11 235 lb (106.595 kg)  03/17/11 235 lb 3.2 oz (106.686 kg)   Chart reviewed. Pt wt up 2# x 1 month. Noted 8# (3.5%) wt loss x 14 months, which is not significant. Pt s/p lap chole today d/t cholelithiasis. Pt just advanced to clear liquids today, previously NPO.   There is no height or weight on file to calculate BMI. Most current height and weight obtained on 05/10/12 from [facility] reports ht of 6' and wt of 227#. Based on wt and ht from 05/10/12, BMI is 30.79; patient meets criteria for obesity, class I based on current BMI.   Current diet order is clear liquid, patient is consuming approximately n/a% of meals at this time. Labs and medications reviewed.   No nutrition interventions warranted at this time. If nutrition issues arise, please consult RD.   Melody Haver, RD, LDN Pager: [phone]

## 2012-05-16 ENCOUNTER — Encounter (HOSPITAL_COMMUNITY): Payer: Self-pay | Admitting: General Surgery

## 2012-06-15 DIAGNOSIS — R3911 Hesitancy of micturition: Secondary | ICD-10-CM | POA: Diagnosis not present

## 2012-06-15 DIAGNOSIS — N401 Enlarged prostate with lower urinary tract symptoms: Secondary | ICD-10-CM | POA: Diagnosis not present

## 2012-09-05 ENCOUNTER — Ambulatory Visit (INDEPENDENT_AMBULATORY_CARE_PROVIDER_SITE_OTHER): Payer: BC Managed Care – PPO | Admitting: Family Medicine

## 2012-09-05 ENCOUNTER — Encounter: Payer: Self-pay | Admitting: Family Medicine

## 2012-09-05 VITALS — BP 110/70 | HR 68 | Temp 98.6°F | Resp 18 | Ht 70.5 in | Wt 220.0 lb

## 2012-09-05 DIAGNOSIS — E781 Pure hyperglyceridemia: Secondary | ICD-10-CM

## 2012-09-05 DIAGNOSIS — E785 Hyperlipidemia, unspecified: Secondary | ICD-10-CM | POA: Diagnosis not present

## 2012-09-05 DIAGNOSIS — K219 Gastro-esophageal reflux disease without esophagitis: Secondary | ICD-10-CM

## 2012-09-05 DIAGNOSIS — N400 Enlarged prostate without lower urinary tract symptoms: Secondary | ICD-10-CM | POA: Diagnosis not present

## 2012-09-05 DIAGNOSIS — Z1329 Encounter for screening for other suspected endocrine disorder: Secondary | ICD-10-CM | POA: Diagnosis not present

## 2012-09-05 DIAGNOSIS — Z130 Encounter for screening for diseases of the blood and blood-forming organs and certain disorders involving the immune mechanism: Secondary | ICD-10-CM | POA: Diagnosis not present

## 2012-09-05 DIAGNOSIS — M47816 Spondylosis without myelopathy or radiculopathy, lumbar region: Secondary | ICD-10-CM

## 2012-09-05 DIAGNOSIS — G4730 Sleep apnea, unspecified: Secondary | ICD-10-CM

## 2012-09-05 DIAGNOSIS — M47817 Spondylosis without myelopathy or radiculopathy, lumbosacral region: Secondary | ICD-10-CM

## 2012-09-05 DIAGNOSIS — Z1321 Encounter for screening for nutritional disorder: Secondary | ICD-10-CM

## 2012-09-05 LAB — COMPREHENSIVE METABOLIC PANEL
Albumin: 4.6 g/dL (ref 3.5–5.2)
BUN: 14 mg/dL (ref 6–23)
CO2: 29 mEq/L (ref 19–32)
Calcium: 9.8 mg/dL (ref 8.4–10.5)
Chloride: 101 mEq/L (ref 96–112)
Glucose, Bld: 90 mg/dL (ref 70–99)
Potassium: 4.2 mEq/L (ref 3.5–5.3)

## 2012-09-05 LAB — CBC
Hemoglobin: 14.6 g/dL (ref 13.0–17.0)
MCHC: 35 g/dL (ref 30.0–36.0)
RBC: 4.84 MIL/uL (ref 4.22–5.81)

## 2012-09-05 LAB — LIPID PANEL
Cholesterol: 241 mg/dL — ABNORMAL HIGH (ref 0–200)
Triglycerides: 165 mg/dL — ABNORMAL HIGH (ref <150)

## 2012-09-05 LAB — TESTOSTERONE: Testosterone: 267 ng/dL — ABNORMAL LOW (ref 300–890)

## 2012-09-05 MED ORDER — TAMSULOSIN HCL 0.4 MG PO CAPS: 0.4000 mg | ORAL_CAPSULE | Freq: Every day | ORAL | Status: DC

## 2012-09-05 NOTE — Progress Notes (Signed)
  Subjective:    Patient ID: Derek [name], male    DOB: 31-Aug-1960, 52 y.o.   MRN: [id number]  HPI Patient here to establish care. Previous PCP [facility]. Medications reviewed.  He is currently followed by Dr. Sharolyn Douglas for multiple back surgery and neck surgery which is still under workers compensation since 2009. He takes Lyrica twice a day as well as hydrocodone and Robaxin as needed. He's had extensive surgeries see above in past medical history. He was recently admitted and evaluated for gallstone pancreatitis he is status post cholecystectomy in February of 2014. Colonoscopy up to date He has a history of hyperlipidemia and will like to have his lipids rechecked today. He is currently on niacin he was never on statin drugs. No history of hypertension, diabetes, heart disease. He would also like to have his testosterone as well as PSA checked. He does have a family history of prostate cancer.  There was also some concern about decreased blood flow in his left lower extremity he had arterial studies done in 2010 which showed normal blood flow. Wife and pt also concerned about sleep apnea, he stops breathing at night, has difficulty sleeping and snores.   Review of Systems  GEN- denies fatigue, fever, weight loss,weakness, recent illness HEENT- denies eye drainage, change in vision, nasal discharge, CVS- denies chest pain, palpitations RESP- denies SOB, cough, wheeze ABD- denies N/V, change in stools, abd pain GU- denies dysuria, hematuria, dribbling, incontinence MSK- + joint pain, muscle aches, injury Neuro- denies headache, dizziness, syncope, seizure activity      Objective:   Physical Exam GEN- NAD, alert and oriented x3, obese HEENT- PERRL, EOMI, non injected sclera, pink conjunctiva, MMM, oropharynx clear Neck- Supple, some decreased ROM CVS- RRR, no murmur RESP-CTAB ABD-NABS,soft,NT,ND  Back- Multiple well healed scars on back EXT- No edema Pulses-  Radial, DP- 2+  Epworth scale 9       Assessment & Plan:

## 2012-09-05 NOTE — Patient Instructions (Addendum)
Referral to neurology for the sleep study  Labs to be done fasting today Medications refilled I will get records from [place] F/U 6 months

## 2012-09-05 NOTE — Assessment & Plan Note (Signed)
Concern for OSA, refer to neurology for sleep study

## 2012-09-05 NOTE — Assessment & Plan Note (Signed)
Flomax refilled, PSA done discussed risks and benefits, testosterone also to be checked

## 2012-09-05 NOTE — Assessment & Plan Note (Signed)
Followed by GI on PPI. 

## 2012-09-05 NOTE — Assessment & Plan Note (Addendum)
Check FLP, may need statin therapy  Intolerance to statin drugs per records, trial of zetia

## 2012-09-12 ENCOUNTER — Encounter: Payer: Self-pay | Admitting: Family Medicine

## 2012-09-12 MED ORDER — EZETIMIBE 10 MG PO TABS: 10.0000 mg | ORAL_TABLET | Freq: Every day | ORAL | Status: DC

## 2012-09-12 MED ORDER — TESTOSTERONE 20.25 MG/1.25GM (1.62%) TD GEL: 2.0000 | Freq: Every day | TRANSDERMAL | Status: DC

## 2012-09-12 NOTE — Addendum Note (Signed)
Addended by: Milinda Antis F on: 09/12/2012 06:02 PM   Modules accepted: Orders

## 2012-09-13 ENCOUNTER — Telehealth: Payer: Self-pay | Admitting: Family Medicine

## 2012-09-13 NOTE — Telephone Encounter (Signed)
Rx sent to [facility] in [place]

## 2012-09-13 NOTE — Telephone Encounter (Deleted)
RX sent over to [facility]

## 2012-09-14 LAB — VITAMIN D 1,25 DIHYDROXY
Vitamin D 1, 25 (OH)2 Total: 48 pg/mL (ref 18–72)
Vitamin D2 1, 25 (OH)2: <8 pg/mL

## 2012-09-26 ENCOUNTER — Encounter: Payer: Self-pay | Admitting: Family Medicine

## 2012-09-26 ENCOUNTER — Encounter: Payer: Self-pay | Admitting: Internal Medicine

## 2012-11-01 DIAGNOSIS — R5383 Other fatigue: Secondary | ICD-10-CM | POA: Diagnosis not present

## 2012-11-01 DIAGNOSIS — G4700 Insomnia, unspecified: Secondary | ICD-10-CM | POA: Diagnosis not present

## 2012-11-01 DIAGNOSIS — M79609 Pain in unspecified limb: Secondary | ICD-10-CM | POA: Diagnosis not present

## 2012-11-01 DIAGNOSIS — R5381 Other malaise: Secondary | ICD-10-CM | POA: Diagnosis not present

## 2012-11-01 DIAGNOSIS — G4733 Obstructive sleep apnea (adult) (pediatric): Secondary | ICD-10-CM | POA: Diagnosis not present

## 2012-11-24 ENCOUNTER — Other Ambulatory Visit: Payer: Self-pay

## 2012-11-24 DIAGNOSIS — G4730 Sleep apnea, unspecified: Secondary | ICD-10-CM

## 2012-12-06 ENCOUNTER — Other Ambulatory Visit: Payer: Self-pay | Admitting: Family Medicine

## 2012-12-06 NOTE — Telephone Encounter (Signed)
Meds refilled.

## 2012-12-12 ENCOUNTER — Ambulatory Visit: Payer: BC Managed Care – PPO | Attending: Neurology | Admitting: Sleep Medicine

## 2012-12-12 DIAGNOSIS — Z6835 Body mass index (BMI) 35.0-35.9, adult: Secondary | ICD-10-CM | POA: Insufficient documentation

## 2012-12-12 DIAGNOSIS — G4730 Sleep apnea, unspecified: Secondary | ICD-10-CM

## 2012-12-12 DIAGNOSIS — G4733 Obstructive sleep apnea (adult) (pediatric): Secondary | ICD-10-CM | POA: Insufficient documentation

## 2012-12-17 NOTE — Procedures (Signed)
[facility] [name] A. [name], MD     [email or web address]        NAME:  Derek [name], Derek [name]               ACCOUNT NO.:  [id number]  MEDICAL RECORD NO.:  [id number]          PATIENT TYPE:  OUT  LOCATION:  SLEEP LAB                     FACILITY:  [facility]  PHYSICIAN:  [name] A. [name], M.D. DATE OF BIRTH:  Apr 23, 1960  DATE OF STUDY:  12/12/2012                           NOCTURNAL POLYSOMNOGRAM  REFERRING PHYSICIAN:  [name], M.D.  INDICATION:  This is a 51 year old man who presents with hypersomnia, fatigue, and obesity.  There is also history of snoring.   MEDICATIONS:  Zetia, Flomax, Lyrica, Prilosec, Robaxin, Norco, vitamins C, omega-3, multivitamin, aspirin.  EPWORTH SLEEPINESS SCALE:  11.  BMI:  35.  ARCHITECTURAL SUMMARY:  The total recording time is 411 minutes.  Sleep efficiency 83%.  Sleep latency 35 minutes.  REM latency 95 minutes. Stage N1 of 10%, N2 of 56% N3 of 18% and REM sleep 5%.   RESPIRATORY DATA:  Baseline oxygen saturation is 95, lowest saturation 81 during REM sleep.  Diagnostic AHI 10 and RDI also 10.  The REM AHI is 3.  Showing that most of the events occurred during REM sleep.  LIMB MOVEMENT SUMMARY:  PLM index 0.  ELECTROCARDIOGRAM SUMMARY:  Average heart rate is 55 with no significant dysrhythmias observed.  IMPRESSION:  Mild REM related obstructive sleep apnea syndrome. Consider positive pressure treatment.    [name], M.D.    KAD/MEDQ  D:  12/17/2012 13:38:59  T:  12/17/2012 13:47:07  Job:  [id number]

## 2013-01-02 DIAGNOSIS — G4733 Obstructive sleep apnea (adult) (pediatric): Secondary | ICD-10-CM | POA: Diagnosis not present

## 2013-01-04 ENCOUNTER — Telehealth: Payer: Self-pay | Admitting: Family Medicine

## 2013-01-04 ENCOUNTER — Ambulatory Visit (INDEPENDENT_AMBULATORY_CARE_PROVIDER_SITE_OTHER): Payer: BC Managed Care – PPO | Admitting: Family Medicine

## 2013-01-04 ENCOUNTER — Ambulatory Visit (HOSPITAL_COMMUNITY)
Admission: RE | Admit: 2013-01-04 | Discharge: 2013-01-04 | Disposition: A | Discharge status: Home / Self Care | Payer: BC Managed Care – PPO | Source: Ambulatory Visit | Attending: Family Medicine | Admitting: Family Medicine

## 2013-01-04 ENCOUNTER — Encounter: Payer: Self-pay | Admitting: Family Medicine

## 2013-01-04 VITALS — BP 110/80 | HR 72 | Temp 97.1°F | Resp 18 | Wt 225.0 lb

## 2013-01-04 DIAGNOSIS — S6990XA Unspecified injury of unspecified wrist, hand and finger(s), initial encounter: Secondary | ICD-10-CM | POA: Insufficient documentation

## 2013-01-04 DIAGNOSIS — G4700 Insomnia, unspecified: Secondary | ICD-10-CM

## 2013-01-04 DIAGNOSIS — S6992XA Unspecified injury of left wrist, hand and finger(s), initial encounter: Secondary | ICD-10-CM

## 2013-01-04 DIAGNOSIS — E781 Pure hyperglyceridemia: Secondary | ICD-10-CM | POA: Diagnosis not present

## 2013-01-04 DIAGNOSIS — S6980XA Other specified injuries of unspecified wrist, hand and finger(s), initial encounter: Secondary | ICD-10-CM

## 2013-01-04 DIAGNOSIS — W298XXA Contact with other powered powered hand tools and household machinery, initial encounter: Secondary | ICD-10-CM | POA: Insufficient documentation

## 2013-01-04 DIAGNOSIS — S62639A Displaced fracture of distal phalanx of unspecified finger, initial encounter for closed fracture: Secondary | ICD-10-CM | POA: Insufficient documentation

## 2013-01-04 LAB — LIPID PANEL
Cholesterol: 163 mg/dL (ref 0–200)
VLDL: 29 mg/dL (ref 0–40)

## 2013-01-04 LAB — COMPREHENSIVE METABOLIC PANEL
ALT: 29 U/L (ref 0–53)
AST: 28 U/L (ref 0–37)
Albumin: 4.5 g/dL (ref 3.5–5.2)
BUN: 16 mg/dL (ref 6–23)
Calcium: 10.6 mg/dL — ABNORMAL HIGH (ref 8.4–10.5)
Chloride: 104 mEq/L (ref 96–112)
Potassium: 5.3 mEq/L (ref 3.5–5.3)
Total Protein: 6.6 g/dL (ref 6.0–8.3)

## 2013-01-04 NOTE — Progress Notes (Signed)
  Subjective:    Patient ID: Derek [name], male    DOB: [birth year], 52 y.o.   MRN: [id number]  HPI  Pt here to f/u Hypertriglyceridemia. Started on zetia last visit 3 months ago, has not had any problems with the medication., Intolerable to statins. OSA- had f/u with neurology yesterday planning for CPAP  Planning to have nerve stimulator removed from back once workers comp approves. Now on Ultram by neurosurgery  Left  Thumb injury- 2 weeks ago was drilling through a metal bracket to put a shelf up and drill went through his thumb. DId not seek care, had extensive bleeding, no pus draining. Now healed up but feels a knot on bottom on thumb, concerned he has metal still in the thumb, can feel it when he presses thumb down or goes to grab something. No pain currently in finger.  Never received testosteorne script  Insomnia- using tylenol PM and melatonin, asked about correct dose of melatonin    Review of Systems - per above  GEN- denies fatigue, fever, weight loss,weakness, recent illness HEENT- denies eye drainage, change in vision, nasal discharge, CVS- denies chest pain, palpitations RESP- denies SOB, cough, wheeze MSK- denies joint pain, muscle aches,+ injury        Objective:   Physical Exam GEN- NAD, alert and oriented x3 CVS- RRR, no murmur RESP-CTAB EXT- No edema, Left thumb- medial aspect small scab, no erythema no driange, ecchymosis at medial edge of nail, nodule lesion on fat pad of thumb same spot as drill exit , normal ROM of Thumb Pulses- Radial, DP- 2+        Assessment & Plan:

## 2013-01-04 NOTE — Telephone Encounter (Signed)
Discussed results with pt, given red flags I also ran case by hand surgeon at [facility] office, nothing to do, will heal on its on, since he does not have any pain we do not have to splint

## 2013-01-04 NOTE — Assessment & Plan Note (Signed)
Has tried [place] in the past, treating is OSA will also help Advised 10mg  melatonin at bedtime

## 2013-01-04 NOTE — Patient Instructions (Signed)
Get the xray of finger We will call with lab results I will check on the testosterone  F/U 6 months

## 2013-01-04 NOTE — Assessment & Plan Note (Signed)
Injury to left thumb, Tetanus UTD No signs of infection Concern for bony injury vs metal in thumb pad Xray of finger

## 2013-01-04 NOTE — Assessment & Plan Note (Signed)
On zetia recheck FLP and LFT today

## 2013-01-12 ENCOUNTER — Telehealth: Payer: Self-pay | Admitting: Family Medicine

## 2013-01-12 NOTE — Telephone Encounter (Signed)
error 

## 2013-01-12 NOTE — Telephone Encounter (Signed)
Androgel 1.62% has been approved start date is from Sept 9 2014- Oct 9,2015, information has been sent to [employer].

## 2013-02-03 ENCOUNTER — Other Ambulatory Visit: Payer: Self-pay | Admitting: Family Medicine

## 2013-02-03 NOTE — Telephone Encounter (Signed)
Medication refilled per protocol. 

## 2013-02-09 ENCOUNTER — Other Ambulatory Visit: Payer: Self-pay

## 2013-03-07 ENCOUNTER — Ambulatory Visit: Payer: BC Managed Care – PPO | Admitting: Family Medicine

## 2013-03-17 ENCOUNTER — Ambulatory Visit (HOSPITAL_COMMUNITY)
Admission: RE | Admit: 2013-03-17 | Discharge: 2013-03-17 | Disposition: A | Discharge status: Home / Self Care | Payer: BC Managed Care – PPO | Source: Ambulatory Visit | Attending: Family Medicine | Admitting: Family Medicine

## 2013-03-17 ENCOUNTER — Ambulatory Visit (INDEPENDENT_AMBULATORY_CARE_PROVIDER_SITE_OTHER): Payer: BC Managed Care – PPO | Admitting: Family Medicine

## 2013-03-17 ENCOUNTER — Encounter: Payer: Self-pay | Admitting: Family Medicine

## 2013-03-17 VITALS — BP 100/80 | HR 98 | Temp 98.3°F | Resp 18 | Ht 72.0 in | Wt 234.0 lb

## 2013-03-17 DIAGNOSIS — M25569 Pain in unspecified knee: Secondary | ICD-10-CM

## 2013-03-17 DIAGNOSIS — M7989 Other specified soft tissue disorders: Secondary | ICD-10-CM | POA: Insufficient documentation

## 2013-03-17 DIAGNOSIS — S8990XA Unspecified injury of unspecified lower leg, initial encounter: Secondary | ICD-10-CM | POA: Insufficient documentation

## 2013-03-17 DIAGNOSIS — M25561 Pain in right knee: Secondary | ICD-10-CM

## 2013-03-17 DIAGNOSIS — M79609 Pain in unspecified limb: Secondary | ICD-10-CM | POA: Insufficient documentation

## 2013-03-17 DIAGNOSIS — W19XXXA Unspecified fall, initial encounter: Secondary | ICD-10-CM | POA: Insufficient documentation

## 2013-03-17 MED ORDER — HYDROCODONE-ACETAMINOPHEN 5-325 MG PO TABS: 1.0000 | ORAL_TABLET | Freq: Four times a day (QID) | ORAL | Status: DC | PRN

## 2013-03-17 NOTE — Patient Instructions (Signed)
Get the xray of the knee done Try the 5-325mg  hydrocodone F/U as previous

## 2013-03-18 ENCOUNTER — Encounter: Payer: Self-pay | Admitting: Family Medicine

## 2013-03-18 DIAGNOSIS — M25561 Pain in right knee: Secondary | ICD-10-CM | POA: Insufficient documentation

## 2013-03-18 NOTE — Assessment & Plan Note (Signed)
Status post injury. She does have some significant tenderness and swelling of the patella and tibial region. So I obtained x-rays of the knee and leg. There was no evidence of acute fracture.  I've given him hydrocodone 5 325 to take short-term for the knee. He can also take anti-inflammatories for the next week or so. Continue icing. The swelling has gone down significantly.

## 2013-03-18 NOTE — Progress Notes (Signed)
   Subjective:    Patient ID: Derek [name], male    DOB: October 19, 1960, 52 y.o.   MRN: [id number]  HPI Patient here with right knee pain. 2 days ago he was helping a friend to her on the well when he fell on the concrete  Surrounding the well injuring his right knee. He has significant swelling and pain but decided to hold off on evaluation. He presents today with worsening pain although the swelling has improved. He's been taking over-the-counter ibuprofen and Ultram which is given by his neurosurgeon with little improvement. He has pain when walking but is able to bend and extend the knee.   Review of Systems - per above  GEN- denies fatigue, fever, weight loss,weakness, recent illness MSK-  joint pain, muscle aches, injury Neuro- denies headache, dizziness, syncope, seizure activity      Objective:   Physical Exam GEN-NAD, alert and oriented x 3 MSK- Right knee- swelling over tibial tuberosity and inferior patella, TTP over inferior patellla and tibial region, fair ROM decreased by Pain, liagments in tact, antalgic gait, normal ROM right ankle. No swelling in popliteal region Skin-Abrasions to knee and shin area       Assessment & Plan:

## 2013-05-17 ENCOUNTER — Ambulatory Visit (HOSPITAL_COMMUNITY)
Admission: RE | Admit: 2013-05-17 | Discharge: 2013-05-17 | Disposition: A | Discharge status: Home / Self Care | Payer: BC Managed Care – PPO | Source: Ambulatory Visit | Attending: Family Medicine | Admitting: Family Medicine

## 2013-05-17 ENCOUNTER — Ambulatory Visit (INDEPENDENT_AMBULATORY_CARE_PROVIDER_SITE_OTHER): Payer: BC Managed Care – PPO | Admitting: Family Medicine

## 2013-05-17 ENCOUNTER — Encounter: Payer: Self-pay | Admitting: Family Medicine

## 2013-05-17 VITALS — BP 130/80 | HR 78 | Temp 98.1°F | Resp 18 | Ht 72.0 in | Wt 244.0 lb

## 2013-05-17 DIAGNOSIS — M25549 Pain in joints of unspecified hand: Secondary | ICD-10-CM | POA: Insufficient documentation

## 2013-05-17 DIAGNOSIS — M25649 Stiffness of unspecified hand, not elsewhere classified: Secondary | ICD-10-CM | POA: Insufficient documentation

## 2013-05-17 DIAGNOSIS — R209 Unspecified disturbances of skin sensation: Secondary | ICD-10-CM

## 2013-05-17 DIAGNOSIS — M79609 Pain in unspecified limb: Secondary | ICD-10-CM | POA: Diagnosis not present

## 2013-05-17 DIAGNOSIS — R202 Paresthesia of skin: Secondary | ICD-10-CM

## 2013-05-17 MED ORDER — NAPROXEN 500 MG PO TABS: 500.0000 mg | ORAL_TABLET | Freq: Two times a day (BID) | ORAL | Status: DC

## 2013-05-17 NOTE — Progress Notes (Signed)
Patient ID: Derek [name], male   DOB: [birth year], 53 y.o.   MRN: [id number]   Subjective:    Patient ID: Derek [name], male    DOB: 11/27/1960, 53 y.o.   MRN: [id number]  Patient presents for pain in both hands feels like a tingle/stinging   Pain and stiffness in hands for past 6 months to 1 year. He is a Therapist, nutritional and noticed more pain and tingling sharp pains in his hands with playing. He also wakes up with severe stiffness unable to make a fist and some numbness. He has not noted any joint swelling but states his hands feel tight. NO OTC meds taken, he was wondering if gout was a cause of his symptoms.   Review Of Systems:  GEN- denies fatigue, fever, weight loss,weakness, recent illness MSK-+ joint pain, muscle aches, injury Neuro- denies headache, dizziness, syncope, seizure activity, +paresthesia hands       Objective:    BP 130/80  Pulse 78  Temp(Src) 98.1 F (36.7 C) (Oral)  Resp 18  Ht 6' (1.829 m)  Wt 244 lb (110.678 kg)  BMI 33.09 kg/m2 GEN- NAD, alert and oriented x3 MSK- bilat hands, no swelling noted in MIP or DIP joints, unable to make complete fist, FROM wrist Neuro- normal tone UE, neg phalens, neg tinels EXT- No edema Pulses- Radial 2+        Assessment & Plan:      Problem List Items Addressed This Visit   None    Visit Diagnoses   Pain, joint, hand    -  Primary  Differential OA vs RA, carpal tunnel with the history of numbness and tingling and he is a musician Check inflammatory markers, obtain xrays of hands, start naprosyn BID, we may need nerve conduction studies if all else negative     Relevant Orders       C-reactive protein       Sedimentation Rate       Rheumatoid factor       Uric Acid       DG Hand Complete Left (Completed)       DG Hand Complete Right (Completed)       Note: This dictation was prepared with Dragon dictation along with smaller phrase technology. Any transcriptional errors that result from this process are  unintentional.

## 2013-05-17 NOTE — Patient Instructions (Signed)
Start naprosyn twice a day with food Get the xrays done I will call with the blood work We may need nerve conduction study done F/U in April

## 2013-05-18 LAB — RHEUMATOID FACTOR: Rhuematoid fact SerPl-aCnc: <10 IU/mL (ref <=14)

## 2013-05-18 LAB — C-REACTIVE PROTEIN: CRP: <0.5 mg/dL (ref <0.60)

## 2013-05-18 LAB — SEDIMENTATION RATE: Sed Rate: 1 mm/hr (ref 0–16)

## 2013-05-18 LAB — URIC ACID: Uric Acid, Serum: 6.4 mg/dL (ref 4.0–7.8)

## 2013-05-19 ENCOUNTER — Telehealth: Payer: Self-pay | Admitting: Family Medicine

## 2013-05-19 ENCOUNTER — Encounter: Payer: Self-pay | Admitting: *Deleted

## 2013-05-19 NOTE — Addendum Note (Signed)
Addended by: Vic Blackbird F on: 05/19/2013 01:37 PM   Modules accepted: Orders

## 2013-05-19 NOTE — Telephone Encounter (Signed)
     More Detail >>      YD:XAJOINOMVEHMC      PAXON PROPES        Sent: Fri May 19, 2013 10:07 AM      To: [facility] Clinical Pool              Message      my hands still hurt and I haven't seen any change since starting the meds             ----- Message -----      From: Vic Blackbird, MD      Sent: 05/19/2013 12:00 AM EST      To: [name]      Subject: Questionnaire             To ensure we are providing you the highest quality healthcare, we'd like to know how you are feeling after your recent visit. At your earliest convenience, please complete the brief follow-up assessment by clicking the Task: Questionnaire link listed above.             Thank you for your time in helping [place] improve our services and for partnering with [place] in your wellness and care.             Sincerely,             Your Care Team                Select Font Size     Small     Medium     Large     Extra Extra Large

## 2013-05-19 NOTE — Telephone Encounter (Signed)
error 

## 2013-05-30 ENCOUNTER — Telehealth: Payer: Self-pay | Admitting: *Deleted

## 2013-05-30 DIAGNOSIS — M25549 Pain in joints of unspecified hand: Secondary | ICD-10-CM

## 2013-05-30 NOTE — Telephone Encounter (Signed)
Pt states he would like to have a referral to the [facility] Dr. Noemi Chapel, states the is still having pain in hands and right thumb around joints,so sore cant touch it.

## 2013-05-30 NOTE — Telephone Encounter (Signed)
Referral to be done

## 2013-06-05 DIAGNOSIS — M25549 Pain in joints of unspecified hand: Secondary | ICD-10-CM | POA: Diagnosis not present

## 2013-06-07 DIAGNOSIS — G5600 Carpal tunnel syndrome, unspecified upper limb: Secondary | ICD-10-CM | POA: Diagnosis not present

## 2013-07-05 DIAGNOSIS — G5600 Carpal tunnel syndrome, unspecified upper limb: Secondary | ICD-10-CM | POA: Diagnosis not present

## 2013-07-14 ENCOUNTER — Ambulatory Visit: Payer: BC Managed Care – PPO | Admitting: Family Medicine

## 2013-07-17 ENCOUNTER — Ambulatory Visit: Payer: BC Managed Care – PPO | Admitting: Family Medicine

## 2013-07-18 ENCOUNTER — Ambulatory Visit (INDEPENDENT_AMBULATORY_CARE_PROVIDER_SITE_OTHER): Payer: BC Managed Care – PPO | Admitting: Family Medicine

## 2013-07-18 ENCOUNTER — Encounter: Payer: Self-pay | Admitting: Family Medicine

## 2013-07-18 ENCOUNTER — Telehealth: Payer: Self-pay | Admitting: *Deleted

## 2013-07-18 VITALS — BP 134/78 | HR 64 | Temp 97.9°F | Resp 18 | Ht 72.0 in | Wt 238.0 lb

## 2013-07-18 DIAGNOSIS — E669 Obesity, unspecified: Secondary | ICD-10-CM | POA: Diagnosis not present

## 2013-07-18 DIAGNOSIS — G4700 Insomnia, unspecified: Secondary | ICD-10-CM | POA: Diagnosis not present

## 2013-07-18 DIAGNOSIS — M25549 Pain in joints of unspecified hand: Secondary | ICD-10-CM | POA: Diagnosis not present

## 2013-07-18 DIAGNOSIS — E781 Pure hyperglyceridemia: Secondary | ICD-10-CM | POA: Diagnosis not present

## 2013-07-18 MED ORDER — DICLOFENAC SODIUM 1 % TD GEL: TRANSDERMAL | Status: DC

## 2013-07-18 NOTE — Assessment & Plan Note (Signed)
He declines any further intervention for now he'll continue the melatonin

## 2013-07-18 NOTE — Assessment & Plan Note (Signed)
He continues to lose weight intentionally.

## 2013-07-18 NOTE — Telephone Encounter (Signed)
Received fax requesting PA for Voltaren.   PA submitted.

## 2013-07-18 NOTE — Assessment & Plan Note (Signed)
His x-ray was normal however he does have a deformity from where he had an injury to the thumb. I will have him try topical  Voltaren gel to see if this helps.

## 2013-07-18 NOTE — Progress Notes (Signed)
Patient ID: Derek [name], male   DOB: 1960/10/30, 53 y.o.   MRN: [id number]   Subjective:    Patient ID: Derek [name], male    DOB: [birth year], 53 y.o.   MRN: [id number]  Patient presents for 2 month F/U  patient is to follow chronic medical problems. He was seen by orthopedics and is status post bilateral carpal tunnel surgery. He continues to have pain in his right thumb joint and thinks it is still arthritis as that did not improve with the surgery. He is no longer taking any anti-inflammatories. He is on pain medication prescribed by orthopedics.  Hyperlipidemia-he is trying his best to come off of his study a. He's been trying to lose weight and change his diet. He is due for repeat labs.  Insomnia-he continues to have difficulty with insomnia he takes melatonin but does not want any prescribed medications.    Review Of Systems:  GEN- denies fatigue, fever, weight loss,weakness, recent illness HEENT- denies eye drainage, change in vision, nasal discharge, CVS- denies chest pain, palpitations RESP- denies SOB, cough, wheeze MSK- + joint pain, muscle aches, injury Neuro- denies headache, dizziness, syncope, seizure activity       Objective:    BP 134/78  Pulse 64  Temp(Src) 97.9 F (36.6 C) (Oral)  Resp 18  Ht 6' (1.829 m)  Wt 238 lb (107.956 kg)  BMI 32.27 kg/m2 GEN- NAD, alert and oriented x3 HEENT- PERRL. EOMI, NON icteric, MMM, Oropharynx Clear  CVS-RRR, no murmur RESP-CTAB MSK- bilat hands,well healed carpal tunnel incision, Right thumb, good ROM, mild TTP at PIP, no swelling noted EXT- No edema Pulses- Radial 2+       Assessment & Plan:      Problem List Items Addressed This Visit   None      Note: This dictation was prepared with Dragon dictation along with smaller phrase technology. Any transcriptional errors that result from this process are unintentional.

## 2013-07-18 NOTE — Assessment & Plan Note (Signed)
We will check fasting lipid panel for now he'll continue the study in the fish oil.

## 2013-07-18 NOTE — Patient Instructions (Signed)
F/u  6 months for PHYSICAL  Return in morning to get labs done for cholesterol Try the voltaren gel

## 2013-07-19 ENCOUNTER — Other Ambulatory Visit: Payer: BC Managed Care – PPO

## 2013-07-19 DIAGNOSIS — E781 Pure hyperglyceridemia: Secondary | ICD-10-CM

## 2013-07-19 LAB — CBC WITH DIFFERENTIAL/PLATELET
Basophils Absolute: 0 10*3/uL (ref 0.0–0.1)
Basophils Relative: 0 % (ref 0–1)
EOS PCT: 3 % (ref 0–5)
Eosinophils Absolute: 0.1 10*3/uL (ref 0.0–0.7)
HCT: 43.9 % (ref 39.0–52.0)
HEMOGLOBIN: 15.4 g/dL (ref 13.0–17.0)
Lymphocytes Relative: 51 % — ABNORMAL HIGH (ref 12–46)
Lymphs Abs: 2.4 10*3/uL (ref 0.7–4.0)
MCH: 30.4 pg (ref 26.0–34.0)
MCHC: 35.1 g/dL (ref 30.0–36.0)
MCV: 86.8 fL (ref 78.0–100.0)
MONOS PCT: 12 % (ref 3–12)
Monocytes Absolute: 0.6 10*3/uL (ref 0.1–1.0)
NEUTROS ABS: 1.6 10*3/uL — AB (ref 1.7–7.7)
Neutrophils Relative %: 34 % — ABNORMAL LOW (ref 43–77)
Platelets: 241 10*3/uL (ref 150–400)
RBC: 5.06 MIL/uL (ref 4.22–5.81)
RDW: 13.9 % (ref 11.5–15.5)
WBC: 4.8 10*3/uL (ref 4.0–10.5)

## 2013-07-19 LAB — COMPREHENSIVE METABOLIC PANEL
ALBUMIN: 4.7 g/dL (ref 3.5–5.2)
ALT: 24 U/L (ref 0–53)
AST: 28 U/L (ref 0–37)
Alkaline Phosphatase: 70 U/L (ref 39–117)
BUN: 12 mg/dL (ref 6–23)
CALCIUM: 9.7 mg/dL (ref 8.4–10.5)
CHLORIDE: 102 meq/L (ref 96–112)
CO2: 29 mEq/L (ref 19–32)
CREATININE: 1.06 mg/dL (ref 0.50–1.35)
GLUCOSE: 85 mg/dL (ref 70–99)
POTASSIUM: 4.6 meq/L (ref 3.5–5.3)
Sodium: 142 mEq/L (ref 135–145)
Total Bilirubin: 0.6 mg/dL (ref 0.2–1.2)
Total Protein: 7 g/dL (ref 6.0–8.3)

## 2013-07-19 LAB — LIPID PANEL
CHOLESTEROL: 191 mg/dL (ref 0–200)
HDL: 40 mg/dL (ref >39)
LDL CALC: 127 mg/dL — AB (ref 0–99)
Total CHOL/HDL Ratio: 4.8 Ratio
Triglycerides: 121 mg/dL (ref <150)
VLDL: 24 mg/dL (ref 0–40)

## 2013-07-24 NOTE — Telephone Encounter (Signed)
Received fax from [employer].   PA approved, effective 06/22/2013- 07/22/2014.  Case ID: [id number].  Pharmacy made aware.

## 2013-07-25 DIAGNOSIS — G4700 Insomnia, unspecified: Secondary | ICD-10-CM | POA: Diagnosis not present

## 2013-07-25 DIAGNOSIS — G4733 Obstructive sleep apnea (adult) (pediatric): Secondary | ICD-10-CM | POA: Diagnosis not present

## 2013-07-28 ENCOUNTER — Telehealth: Payer: Self-pay | Admitting: *Deleted

## 2013-07-28 NOTE — Telephone Encounter (Signed)
Call placed to patient and patient made aware.  

## 2013-07-28 NOTE — Telephone Encounter (Signed)
Have him try the biofreeze or Aspercreme over the counter apply by the OTC directions I am trying to avoid him having to take oral NSAIDS

## 2013-07-28 NOTE — Telephone Encounter (Signed)
Message copied by Sheral Flow on Fri Jul 28, 2013  8:18 AM ------      Message from: Devoria Glassing      Created: Fri Jul 28, 2013  8:10 AM       Patient is calling to say that the cream we prescribed for his hand is too expensive and his hand is getting worse please call him back at [phone] ------

## 2013-07-28 NOTE — Telephone Encounter (Signed)
Returned call to patient.   Reports that after insurance, his co-pay for Voltaren Gel is $67.00.  Requested to have MD advise on new prescription.

## 2013-08-02 ENCOUNTER — Other Ambulatory Visit: Payer: Self-pay | Admitting: *Deleted

## 2013-08-02 MED ORDER — EZETIMIBE 10 MG PO TABS: ORAL_TABLET | ORAL | Status: DC

## 2013-08-02 NOTE — Telephone Encounter (Signed)
Refill appropriate and filled per protocol. 

## 2013-09-13 ENCOUNTER — Encounter: Payer: Self-pay | Admitting: Physician Assistant

## 2013-09-13 ENCOUNTER — Ambulatory Visit (INDEPENDENT_AMBULATORY_CARE_PROVIDER_SITE_OTHER): Payer: BC Managed Care – PPO | Admitting: Physician Assistant

## 2013-09-13 VITALS — BP 120/84 | HR 68 | Temp 97.0°F | Resp 18 | Wt 240.0 lb

## 2013-09-13 DIAGNOSIS — M79609 Pain in unspecified limb: Secondary | ICD-10-CM

## 2013-09-13 DIAGNOSIS — M79644 Pain in right finger(s): Secondary | ICD-10-CM

## 2013-09-13 NOTE — Progress Notes (Signed)
Patient ID: GENEVA PALLAS MRN: [id number], DOB: 1960/08/25, 53 y.o. Date of Encounter: 09/13/2013, 2:47 PM    Chief Complaint:  Chief Complaint  Patient presents with  . thumb pain rt hand    recurrent problem     HPI: 53 y.o. year old white male here for followup evaluation of some problems with his right thumb.  Reports that he has been having some pain and swelling at the base of the right thumb. When he moves the thumb to flex and extend it --there is a popping movement in the thumb.   He saw Dr. Buelah Manis at our office on 05/17/13 with complaints of pain and stiffness in his hands for the past 6 months to one year. He would experience increased amount of pain and tingling sharp pains in his hands when playing music. He would also wake up with severe stiffness and unable to make a fist as well as some numbness. He had not noticed any joint swelling but did note that his hands felt tight. At that visit she obtained labs including: CRP Sed rate Rheumatoid factor Uric acid  All of these labs were completely normal.  She also obtained x-ray. This showed no erosions or periarticular demineralization. No acute bony abnormality. No compelling radiographic signs of arthritis.  Subsequently he was scheduled for nerve conduction studies--says  that he had this performed at [facility] office. Says that these were positive for carpal tunnel and he has undergone bilateral carpal tunnel surgery by Dr. French Ana who he says is with Ernestine Mcmurray. Says since having the bilateral carpal tunnel surgery the numb, tingly sensation in his fingers has resolved. However he is still having some sort of problem with his thumb. Says that at the base of the right thumb it is somewhat swollen and painful and when he tries to move the right thumb there is a popping type of movement. He says that the that Dr. French Ana does not know what is wrong with it. Patient says that he is now noticing the same type of  symptoms beginning to develop in the left thumb.  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 However he has been out of work since 2009 secondary to multiple back surgeries and disability. Says that he now plays some music but otherwise " just piddles around the house."     Home Meds:   Outpatient Prescriptions Prior to Visit  Medication Sig Dispense Refill  . Ascorbic Acid (VITAMIN C) 100 MG tablet Take 100 mg by mouth daily.      [name] Kitchen aspirin 81 MG tablet Take 81 mg by mouth daily.      . Cyanocobalamin (VITAMIN B 12 PO) Take by mouth.      . ezetimibe (ZETIA) 10 MG tablet take 1 tablet by mouth once daily  30 tablet  3  . GARLIC PO Take by mouth.      [name] Kitchen KRILL OIL 1000 MG CAPS Take 1 capsule by mouth daily.      . Melatonin 5 MG TABS Take by mouth. 2 tabs at bedtime      . methocarbamol (ROBAXIN) 500 MG tablet Take 500 mg by mouth at bedtime as needed. Muscle spasms.      . Multiple Vitamin (MULTIVITAMIN) tablet Take 1 tablet by mouth daily.        [name] Kitchen omeprazole (PRILOSEC) 20 MG capsule Take 40 mg by mouth daily.       . pregabalin (  LYRICA) 75 MG capsule Take 150 mg by mouth 2 (two) times daily.       . traMADol (ULTRAM) 50 MG tablet Take 50 mg by mouth every 6 (six) hours as needed for pain.      [name] Kitchen diclofenac sodium (VOLTAREN) 1 % GEL Apply three times a day as needed  100 g  3   No facility-administered medications prior to visit.    Allergies: No Known Allergies    Review of Systems: See HPI for pertinent ROS. All other ROS negative.    Physical Exam: Blood pressure 120/84, pulse 68, temperature 97 F (36.1 C), temperature source Oral, resp. rate 18, weight 240 lb (108.863 kg)., Body mass index is 32.54 kg/(m^2). General: WNWD WM.  Appears in no acute distress. Neck: Supple. No thyromegaly. No lymphadenopathy. Lungs: Clear bilaterally to auscultation without wheezes, rales, or rhonchi. Breathing is unlabored. Heart: Regular rhythm. No murmurs, rubs, or  gallops. Msk:  Strength and tone normal for age. Right Thumb: At the base, there is some mild swelling and some tenderness. When he extends the thumb or flexes the thumb it moves in a popping type of movement Neuro: Alert and oriented X 3. Moves all extremities spontaneously. Gait is normal. CNII-XII grossly in tact. Psych:  Responds to questions appropriately with a normal affect.     ASSESSMENT AND PLAN:  53 y.o. year old male with  1. Pain of right thumb Will refer to an Orhtopedist who specializes in hands for f/u.  - Ambulatory referral to Orthopedic Surgery   Signed, Olean Ree [place], [place], [facility] 09/13/2013 2:47 PM

## 2013-10-30 DIAGNOSIS — I10 Essential (primary) hypertension: Secondary | ICD-10-CM

## 2013-10-30 DIAGNOSIS — I152 Hypertension secondary to endocrine disorders: Secondary | ICD-10-CM

## 2013-10-30 DIAGNOSIS — Z955 Presence of coronary angioplasty implant and graft: Secondary | ICD-10-CM

## 2013-10-30 DIAGNOSIS — E1159 Type 2 diabetes mellitus with other circulatory complications: Secondary | ICD-10-CM

## 2013-12-19 ENCOUNTER — Ambulatory Visit (INDEPENDENT_AMBULATORY_CARE_PROVIDER_SITE_OTHER): Payer: BC Managed Care – PPO | Admitting: Family Medicine

## 2013-12-19 ENCOUNTER — Encounter: Payer: Self-pay | Admitting: Family Medicine

## 2013-12-19 ENCOUNTER — Telehealth: Payer: Self-pay | Admitting: *Deleted

## 2013-12-19 VITALS — BP 136/74 | HR 64 | Temp 98.2°F | Resp 12 | Ht 72.0 in | Wt 246.0 lb

## 2013-12-19 DIAGNOSIS — R109 Unspecified abdominal pain: Secondary | ICD-10-CM

## 2013-12-19 DIAGNOSIS — K5901 Slow transit constipation: Secondary | ICD-10-CM

## 2013-12-19 DIAGNOSIS — G4700 Insomnia, unspecified: Secondary | ICD-10-CM | POA: Diagnosis not present

## 2013-12-19 MED ORDER — ZOLPIDEM TARTRATE 10 MG PO TABS: 10.0000 mg | ORAL_TABLET | Freq: Every evening | ORAL | Status: DC | PRN

## 2013-12-19 MED ORDER — PANTOPRAZOLE SODIUM 40 MG PO TBEC: 40.0000 mg | DELAYED_RELEASE_TABLET | Freq: Every day | ORAL | Status: DC

## 2013-12-19 NOTE — Assessment & Plan Note (Signed)
Based on his history I will obtain labs including an H. pylori , and a lipase is he's had pancreatitis before. I will obtain CT of abdomen and pelvis will also proceed with bowel regimen per above for his constipation. His omeprazole be changed to protonic for the short-term. As he was taking some NSAIDs because of his back this could also be causing some of the dyspepsia

## 2013-12-19 NOTE — Patient Instructions (Addendum)
CT scan to be done for abdomen  We will call with lab results Stop the omeprazole, start protonix Try the ambien for sleep  We will schedule appointment with Dr. Gala Romney  Use miralax to move your bowels F/u pending results

## 2013-12-19 NOTE — Progress Notes (Signed)
Patient ID: Derek [name], male   DOB: 1960/11/29, 53 y.o.   MRN: [id number]   Subjective:    Patient ID: Derek [name], male    DOB: July 17, 1960, 53 y.o.   MRN: [id number]  Patient presents for Intestinal Issues and Insomnia  patient here with 2 complaints. He continues to have insomnia would like to try a prescription medication. He's been using melatonin Benadryl with no improvement. He typically only sleeps for 5 hours a night and sometimes it's not all the way through. He has difficulty falling asleep. He does state that he remembers the surgeon given him amitriptyline but this gave him a hung over and said the next day.  Bowel changes he has had abdominal pain with bowel issues worsening over the past 2 months. He has history of chronic constipation secondary to his chronic opioid use. He has not noticed any blood in the stools has not had any nausea or vomiting but has had epigastric pain and also burning sensation in his right lower quadrant. He is taking omeprazole which he thinks helps when asked he goes up to his throat does not help the burning sensation in his abdomen. 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 He is concerned because of how the pain has been worsening. He has not using NSAIDs as well. He now has pain after he eats and does not want to eat regular meals because he knows he will break his stomach directly afterwards.  He will like to see a nutritionist for his weight as well.    Review Of Systems:  GEN- denies fatigue, fever, weight loss,weakness, recent illness HEENT- denies eye drainage, change in vision, nasal discharge, CVS- denies chest pain, palpitations RESP- denies SOB, cough, wheeze ABD- denies N/V, +change in stools, +abd pain GU- denies dysuria, hematuria, dribbling, incontinence MSK- denies joint pain, muscle aches, injury Neuro- denies headache, dizziness, syncope,  seizure activity       Objective:    BP 136/74  Pulse 64  Temp(Src) 98.2 F (36.8 C) (Oral)  Resp 12  Ht 6' (1.829 m)  Wt 246 lb (111.585 kg)  BMI 33.36 kg/m2 GEN- NAD, alert and oriented x3 HEENT- PERRL, EOMI, non injected sclera, pink conjunctiva, MMM, oropharynx clear Neck- Supple, no LAD CVS- RRR, no murmur RESP-CTAB ABD-NABS,soft,TTP epigastric region and RUQ, distended appearing, no apparent fluid wave liver edge not palpated, no CVA tenderness, small ventral hernia noted EXT- No edema Pulses- Radial 2+        Assessment & Plan:      Problem List Items Addressed This Visit   Insomnia   Constipation   Abdominal pain, unspecified site - Primary   Relevant Orders      CBC with Differential      Comprehensive metabolic panel      Lipase      Helicobacter pylori abs-IgG+IgA, bld      Note: This dictation was prepared with Dragon dictation along with smaller phrase technology. Any transcriptional errors that result from this process are unintentional.

## 2013-12-19 NOTE — Telephone Encounter (Signed)
Received fax requesting PA on Ambien.  PA submitted.

## 2013-12-19 NOTE — Assessment & Plan Note (Signed)
MiraLAX one capful daily her constipation

## 2013-12-19 NOTE — Assessment & Plan Note (Signed)
Trial of Ambien 10 mg when necessary

## 2013-12-20 LAB — CBC WITH DIFFERENTIAL/PLATELET
Basophils Absolute: 0 10*3/uL (ref 0.0–0.1)
Basophils Relative: 0 % (ref 0–1)
Eosinophils Absolute: 0.1 10*3/uL (ref 0.0–0.7)
Eosinophils Relative: 1 % (ref 0–5)
HEMATOCRIT: 40.4 % (ref 39.0–52.0)
HEMOGLOBIN: 13.9 g/dL (ref 13.0–17.0)
LYMPHS ABS: 2.7 10*3/uL (ref 0.7–4.0)
Lymphocytes Relative: 39 % (ref 12–46)
MCH: 31.3 pg (ref 26.0–34.0)
MCHC: 34.4 g/dL (ref 30.0–36.0)
MCV: 91 fL (ref 78.0–100.0)
MONOS PCT: 9 % (ref 3–12)
Monocytes Absolute: 0.6 10*3/uL (ref 0.1–1.0)
NEUTROS ABS: 3.6 10*3/uL (ref 1.7–7.7)
NEUTROS PCT: 51 % (ref 43–77)
Platelets: 248 10*3/uL (ref 150–400)
RBC: 4.44 MIL/uL (ref 4.22–5.81)
RDW: 13.9 % (ref 11.5–15.5)
WBC: 7 10*3/uL (ref 4.0–10.5)

## 2013-12-20 LAB — LIPASE: LIPASE: 17 U/L (ref 0–75)

## 2013-12-20 LAB — COMPREHENSIVE METABOLIC PANEL
ALBUMIN: 4.7 g/dL (ref 3.5–5.2)
ALK PHOS: 73 U/L (ref 39–117)
ALT: 29 U/L (ref 0–53)
AST: 26 U/L (ref 0–37)
BUN: 19 mg/dL (ref 6–23)
CALCIUM: 9.6 mg/dL (ref 8.4–10.5)
CO2: 29 meq/L (ref 19–32)
Chloride: 100 mEq/L (ref 96–112)
Creat: 1.13 mg/dL (ref 0.50–1.35)
GLUCOSE: 84 mg/dL (ref 70–99)
POTASSIUM: 4.5 meq/L (ref 3.5–5.3)
Sodium: 139 mEq/L (ref 135–145)
TOTAL PROTEIN: 6.9 g/dL (ref 6.0–8.3)
Total Bilirubin: 0.4 mg/dL (ref 0.2–1.2)

## 2013-12-20 LAB — HELICOBACTER PYLORI ABS-IGG+IGA, BLD
H PYLORI IGG: 0.58 {ISR}
HELICOBACTER PYLORI AB, IGA: 6.7 U/mL (ref <9.0)

## 2013-12-22 ENCOUNTER — Telehealth: Payer: Self-pay | Admitting: *Deleted

## 2013-12-22 NOTE — Telephone Encounter (Signed)
During conversation help earlier in day, patient states that he is better.   Advised that MD may want to continue with Scan.   States that he does not think he needs it, but would let MD decide.   Message sent to referral nurse to cancel CT Scan.   Call placed to patient. [place].

## 2013-12-22 NOTE — Telephone Encounter (Signed)
Patient returned call and made aware.

## 2013-12-22 NOTE — Telephone Encounter (Signed)
Received PA determination.   PA approved.  

## 2013-12-22 NOTE — Telephone Encounter (Signed)
Received call from patient.   Patient wanted to inform MD that he has been using the Metamucil daily and he has been having regular BM's.   States that his stomach pain has resolved.   MD to be made aware.

## 2013-12-22 NOTE — Telephone Encounter (Signed)
Return call, to pt, if he is better when can hold off on the CT scan, okay to cancel if pt agrees

## 2013-12-25 ENCOUNTER — Telehealth: Payer: Self-pay | Admitting: Family Medicine

## 2013-12-25 DIAGNOSIS — E669 Obesity, unspecified: Secondary | ICD-10-CM

## 2013-12-25 DIAGNOSIS — K5900 Constipation, unspecified: Secondary | ICD-10-CM

## 2013-12-25 DIAGNOSIS — R1013 Epigastric pain: Secondary | ICD-10-CM

## 2013-12-25 DIAGNOSIS — K219 Gastro-esophageal reflux disease without esophagitis: Secondary | ICD-10-CM

## 2013-12-25 NOTE — Telephone Encounter (Signed)
Okay, cancel the CT scan Referral to nutiriontist, Forestine Na okay

## 2013-12-25 NOTE — Telephone Encounter (Signed)
Call placed to patient.   States that he continues to have a bit of discomfort, but it is not anywhere near where it was.   States that he is using Miralax and it is effective. Would like to continue current plan and possibly get referral to nutritionist.   MD please advise.

## 2013-12-25 NOTE — Telephone Encounter (Signed)
Orders placed.

## 2013-12-25 NOTE — Telephone Encounter (Signed)
[phone] Patient is calling regarding his stomach issues not getting better

## 2014-01-03 ENCOUNTER — Telehealth: Payer: Self-pay | Admitting: *Deleted

## 2014-01-03 NOTE — Telephone Encounter (Signed)
Called pt and left message to return my call, pt insurance denied CT scan when it was placed in workque in 09/15.

## 2014-01-03 NOTE — Telephone Encounter (Signed)
Pt called back and stated he is aware that Insurance has Denied his CT scan and that will try to go to nutristonist first and see if he should just try to eat better

## 2014-01-03 NOTE — Telephone Encounter (Signed)
Message copied by Maureen Chatters on Wed Jan 03, 2014 10:26 AM ------      Message from: Devoria Glassing      Created: Mon Jan 01, 2014  3:53 PM       [phone]      Patient was supposed to get a cat scan done, and got better. Now he is saying he wants this again and would like to know if we can get this scheduled for him  ------

## 2014-01-04 ENCOUNTER — Encounter: Payer: Self-pay | Admitting: Dietician

## 2014-01-04 ENCOUNTER — Encounter: Payer: BC Managed Care – PPO | Attending: Family Medicine | Admitting: Dietician

## 2014-01-04 ENCOUNTER — Telehealth: Payer: Self-pay | Admitting: Family Medicine

## 2014-01-04 VITALS — Ht 72.0 in | Wt 240.7 lb

## 2014-01-04 DIAGNOSIS — E669 Obesity, unspecified: Secondary | ICD-10-CM

## 2014-01-04 DIAGNOSIS — Z713 Dietary counseling and surveillance: Secondary | ICD-10-CM | POA: Insufficient documentation

## 2014-01-04 DIAGNOSIS — Z6832 Body mass index (BMI) 32.0-32.9, adult: Secondary | ICD-10-CM | POA: Insufficient documentation

## 2014-01-04 MED ORDER — EZETIMIBE 10 MG PO TABS: ORAL_TABLET | ORAL | Status: DC

## 2014-01-04 NOTE — Telephone Encounter (Signed)
Medication refilled per protocol. 

## 2014-01-04 NOTE — Patient Instructions (Addendum)
-  Keep exercising!  -Keep watching portion sizes   -Stick to 1/2 cup of starch per meal  -Practice reading food labels (look at serving size at the top of the food label)  -Continue to get lean ground beef  -Choose whole grain bread  -Start eating more frequently   -Try to have something within the hour of waking  -Eat at least 3x a day  -Have a protein food with every meal and snack  -Try Smart Balance butter -Try light mayonnaise  -Fill up on non-starchy vegetables (any veggie that is not corn, peas, or potatoes)  Weight goal: 190 lbs  -healthy weight loss: 1-2 lbs per week  Goal: 10-15 pounds in the next 2 months

## 2014-01-04 NOTE — Progress Notes (Signed)
  Medical Nutrition Therapy:  Appt start time: 0945 end time:  1040   Assessment:  Primary concerns today: Derek [name] is here today with concerns about his weight. He has lost 6 pounds in the last 2 weeks but would like more diet-related information. He reports that he doesn't sleep well and is unable to exercise much due to back pain. Cannot stand or sit for long periods of time. He is retired from Primary school teacher. Has recently lost weight by controlling portions and avoiding ice cream.   Preferred Learning Style:   No preference indicated   Learning Readiness:   Ready   MEDICATIONS: see list   DIETARY INTAKE:  Avoided foods include salmon, chicken and fish unless it's fried.   Doesn't eat many sweets.   24-hr recall:  Wakes up between 10-11am L (1:30 PM): pimento cheese sandwich OR roast beef and cabbage Snk ( PM):  D ( PM): spaghetti and salad OR beef and whole grain noodles Snk ( PM): popcorn, carrots  Beverages: water, coffee with 1/2 and 1/2 or whole milk, ~6 beers a month, 1 Gatorade a day per MD  Usual physical activity: 2.6 mile walk about 1x a week and recumbent bike  Estimated energy needs: 1800-2000 calories  Progress Towards Goal(s):  In progress.   Nutritional Diagnosis:  [place]-3.3 Overweight/obesity As related to history of excessive energy intake and inappropriate food choices and physical inactivity.  As evidenced by BMI 33.    Intervention:  Nutrition counseling provided. Goals: -Keep exercising! -Keep watching portion sizes   -Stick to 1/2 cup of starch per meal  -Practice reading food labels (look at serving size at the top of the food label) -Continue to get lean ground beef -Choose whole grain bread -Start eating more frequently   -Try to have something within the hour of waking  -Eat at least 3x a day  -Have a protein food with every meal and snack -Try Smart Balance butter -Try light mayonnaise -Fill up on  non-starchy vegetables (any veggie that is not corn, peas, or potatoes)  Weight goal: 190 lbs  -healthy weight loss: 1-2 lbs per week Goal: 10-15 pounds in the next 2 months  Teaching Method Utilized:  Visual Auditory Hands on  Handouts given during visit include:  15g CHO + protein snacks  MyPlate  Barriers to learning/adherence to lifestyle change: back pain  Demonstrated degree of understanding via:  Teach Back   Monitoring/Evaluation:  Dietary intake, exercise, and body weight in 2 month(s).

## 2014-01-05 ENCOUNTER — Telehealth: Payer: Self-pay | Admitting: *Deleted

## 2014-01-05 MED ORDER — PANTOPRAZOLE SODIUM 40 MG PO TBEC: 40.0000 mg | DELAYED_RELEASE_TABLET | Freq: Every day | ORAL | Status: DC

## 2014-01-05 NOTE — Telephone Encounter (Signed)
You can try to send BID, typically insurance will only cover once a day

## 2014-01-05 NOTE — Telephone Encounter (Signed)
Received call from pt stating that he is needing a refill on protonix and states that he was only prescribed to take one a day but lately he has been taking it twice a day, wants to know if you can prescribe protonix for BID so that his insurance will pay for it..Please advise.  [place]

## 2014-01-05 NOTE — Telephone Encounter (Signed)
Sent script in for BID protonix

## 2014-01-17 ENCOUNTER — Ambulatory Visit (INDEPENDENT_AMBULATORY_CARE_PROVIDER_SITE_OTHER): Payer: BC Managed Care – PPO | Admitting: Family Medicine

## 2014-01-17 ENCOUNTER — Encounter: Payer: Self-pay | Admitting: Family Medicine

## 2014-01-17 VITALS — BP 110/76 | HR 64 | Temp 97.9°F | Resp 16 | Ht 72.0 in | Wt 236.0 lb

## 2014-01-17 DIAGNOSIS — E669 Obesity, unspecified: Secondary | ICD-10-CM | POA: Diagnosis not present

## 2014-01-17 DIAGNOSIS — E781 Pure hyperglyceridemia: Secondary | ICD-10-CM

## 2014-01-17 DIAGNOSIS — Z125 Encounter for screening for malignant neoplasm of prostate: Secondary | ICD-10-CM | POA: Diagnosis not present

## 2014-01-17 DIAGNOSIS — Z0000 Encounter for general adult medical examination without abnormal findings: Secondary | ICD-10-CM | POA: Diagnosis not present

## 2014-01-17 DIAGNOSIS — Z23 Encounter for immunization: Secondary | ICD-10-CM

## 2014-01-17 LAB — CBC WITH DIFFERENTIAL/PLATELET
BASOS PCT: 1 % (ref 0–1)
Basophils Absolute: 0.1 10*3/uL (ref 0.0–0.1)
EOS ABS: 0.2 10*3/uL (ref 0.0–0.7)
Eosinophils Relative: 3 % (ref 0–5)
HCT: 45.4 % (ref 39.0–52.0)
HEMOGLOBIN: 15.5 g/dL (ref 13.0–17.0)
Lymphocytes Relative: 39 % (ref 12–46)
Lymphs Abs: 2.1 10*3/uL (ref 0.7–4.0)
MCH: 31.2 pg (ref 26.0–34.0)
MCHC: 34.1 g/dL (ref 30.0–36.0)
MCV: 91.3 fL (ref 78.0–100.0)
MONOS PCT: 12 % (ref 3–12)
Monocytes Absolute: 0.6 10*3/uL (ref 0.1–1.0)
NEUTROS PCT: 45 % (ref 43–77)
Neutro Abs: 2.4 10*3/uL (ref 1.7–7.7)
Platelets: 247 10*3/uL (ref 150–400)
RBC: 4.97 MIL/uL (ref 4.22–5.81)
RDW: 13.5 % (ref 11.5–15.5)
WBC: 5.3 10*3/uL (ref 4.0–10.5)

## 2014-01-17 LAB — COMPREHENSIVE METABOLIC PANEL
ALT: 35 U/L (ref 0–53)
AST: 28 U/L (ref 0–37)
Albumin: 4.7 g/dL (ref 3.5–5.2)
Alkaline Phosphatase: 65 U/L (ref 39–117)
BILIRUBIN TOTAL: 0.6 mg/dL (ref 0.2–1.2)
BUN: 20 mg/dL (ref 6–23)
CO2: 30 mEq/L (ref 19–32)
Calcium: 10 mg/dL (ref 8.4–10.5)
Chloride: 103 mEq/L (ref 96–112)
Creat: 0.94 mg/dL (ref 0.50–1.35)
GLUCOSE: 85 mg/dL (ref 70–99)
Potassium: 4.7 mEq/L (ref 3.5–5.3)
Sodium: 143 mEq/L (ref 135–145)
Total Protein: 7.3 g/dL (ref 6.0–8.3)

## 2014-01-17 LAB — LIPID PANEL
CHOL/HDL RATIO: 5.1 ratio
Cholesterol: 177 mg/dL (ref 0–200)
HDL: 35 mg/dL — AB (ref >39)
LDL Cholesterol: 111 mg/dL — ABNORMAL HIGH (ref 0–99)
TRIGLYCERIDES: 156 mg/dL — AB (ref <150)
VLDL: 31 mg/dL (ref 0–40)

## 2014-01-17 NOTE — Patient Instructions (Signed)
Continue current meds Increase miralax to twice a day for next 3 days to help with bowel movements Flu shot given We will call with labs or send letter if normal Continue with weight loss! F/U 6 months

## 2014-01-17 NOTE — Assessment & Plan Note (Signed)
CPE done, flu shot given Fasting labs Regarding breast I think this is due to adipose tissue, will monitor

## 2014-01-17 NOTE — Addendum Note (Signed)
Addended by: Sheral Flow on: 01/17/2014 08:33 AM   Modules accepted: Orders

## 2014-01-17 NOTE — Progress Notes (Signed)
Patient ID: Derek [name], male   DOB: 12/30/1960, 53 y.o.   MRN: [id number]   Subjective:    Patient ID: Derek [name], male    DOB: [birth year], 53 y.o.   MRN: [id number]  Patient presents for Annual Exam  patient here for annual examination. He has no specific concerns. His abdominal pain has improved and his bowel movements have him prove some. He still gets some constipation. He states he had a bowel movement with a strain a week ago and had a small amount of blood afterward she's not had a blood since then. He's also stop the anti-inflammatories and this has helped his indigestion. Colonoscopy is up-to-date He is due for flu shot today Fasting labs for his triglycerides Discussed prostate cancer screening patient wants to proceed with PSA Note he also wanted me to check his chest wall he thought that the left breast looked larger than the right he does not have any pain is not felt any lumps he was concern he may have been fluid   Review Of Systems:  GEN- denies fatigue, fever, weight loss,weakness, recent illness HEENT- denies eye drainage, change in vision, nasal discharge, CVS- denies chest pain, palpitations RESP- denies SOB, cough, wheeze ABD- denies N/V, change in stools, abd pain GU- denies dysuria, hematuria, dribbling, incontinence MSK- denies joint pain, muscle aches, injury Neuro- denies headache, dizziness, syncope, seizure activity       Objective:    BP 110/76  Pulse 64  Temp(Src) 97.9 F (36.6 C) (Oral)  Resp 16  Ht 6' (1.829 m)  Wt 236 lb (107.049 kg)  BMI 32.00 kg/m2 GEN- NAD, alert and oriented x3 HEENT- PERRL, EOMI, non injected sclera, pink conjunctiva, MMM, oropharynx clear, TM clear bilat , canals clear Neck- Supple,  Chest Wall- assymetry of fat pad near axilla of left breast, no nipple tenderness, no lumps palpated in axilla or bilat breast tissue CVS- RRR, no murmur RESP-CTAB ABD-NABS,soft,NT,ND Rectum- normal tone, soft brown stool in  vault,FOBT neg, prostate smooth, no nodules, mild enlargement EXT- No edema Pulses- Radial, DP- 2+        Assessment & Plan:      Problem List Items Addressed This Visit   Hypertriglyceridemia - Primary   Relevant Orders      Lipid panel    Other Visit Diagnoses   Routine general medical examination at a health care facility        Relevant Orders       CBC with Differential       Comprehensive metabolic panel    Prostate cancer screening        Relevant Orders       PSA       Note: This dictation was prepared with Dragon dictation along with smaller phrase technology. Any transcriptional errors that result from this process are unintentional.

## 2014-01-17 NOTE — Assessment & Plan Note (Signed)
Intentional weight loss noted, continue with nutritionist

## 2014-01-18 LAB — PSA: PSA: 0.33 ng/mL (ref <=4.00)

## 2014-02-12 DIAGNOSIS — E782 Mixed hyperlipidemia: Secondary | ICD-10-CM

## 2014-03-06 ENCOUNTER — Encounter: Payer: BC Managed Care – PPO | Attending: Family Medicine | Admitting: Dietician

## 2014-03-06 VITALS — Wt 224.8 lb

## 2014-03-06 DIAGNOSIS — Z6832 Body mass index (BMI) 32.0-32.9, adult: Secondary | ICD-10-CM | POA: Diagnosis not present

## 2014-03-06 DIAGNOSIS — E669 Obesity, unspecified: Secondary | ICD-10-CM

## 2014-03-06 DIAGNOSIS — Z713 Dietary counseling and surveillance: Secondary | ICD-10-CM | POA: Insufficient documentation

## 2014-03-06 NOTE — Patient Instructions (Signed)
-  Keep exercising!  -Keep watching portion sizes   -Stick to 1/2 cup of starch per meal  -Practice reading food labels (look at serving size at the top of the food label)  -Continue to get lean ground beef  -Choose whole grain bread  -Start eating more frequently   -Try to have something within the hour of waking  -Eat at least 3x a day  -Have a protein food with every meal and snack  -Try Smart Balance butter -Try light mayonnaise  -Fill up on non-starchy vegetables (any veggie that is not corn, peas, or potatoes)  Weight goal: 190 lbs  -healthy weight loss: 1-2 lbs per week  Goal: 10-15 pounds in the next 2 months

## 2014-03-06 NOTE — Progress Notes (Signed)
  Medical Nutrition Therapy:  Appt start time: 235 end time:  950  Follow up:  Primary concerns today: Derek [name] returns having lost 17 pounds in the last 2 months. He states that he was 217 lbs on his scale at home this morning. He is drinking 3-4 beers per week. Enjoyed peach cobbler and ice cream on Thanksgivig night and vomited later that night. He reports that he also does not tolerate fried foods. "Eating a lot of salads and grilled chicken" and drinking 8-9 bottles of water per day. Derek [name] reports that he is remodeling his house and has not been able to exercise. Overall he is feeling better and his back pain has subsided.   Preferred Learning Style:   No preference indicated   Learning Readiness:   Ready   MEDICATIONS: see list   DIETARY INTAKE:  Avoided foods include salmon, chicken and fish unless it's fried.   Doesn't eat many sweets.   24-hr recall:  B: Cheerios OR scrambled eggs and sausage L (1:30 PM): pimento cheese sandwich on whole grain OR salad with hamburger Snk ( PM): [employer] protein bars D ( PM): salad OR cabbage with grilled pork chop and sweet potato Snk ( PM): popcorn  Beverages: water, coffee with 1/2 and 1/2 or whole milk, ~6 beers a month, 1 Gatorade a day per MD  Usual physical activity: 2.6 mile walk about 1x a week and recumbent bike  Estimated energy needs: 1800-2000 calories  Progress Towards Goal(s):  In progress.   Nutritional Diagnosis:  [place]-3.3 Overweight/obesity As related to history of excessive energy intake and inappropriate food choices and physical inactivity.  As evidenced by BMI 33.    Intervention:  Nutrition counseling provided. Praised patient on successful weight loss. Answered his questions regarding portions of fruit and sufficient protein intake.  Teaching Method Utilized:  Visual Auditory   Barriers to learning/adherence to lifestyle change: back pain  Demonstrated degree of understanding via:  Teach Back    Monitoring/Evaluation:  Dietary intake, exercise, and body weight in 2 month(s).

## 2014-03-26 ENCOUNTER — Telehealth: Payer: Self-pay | Admitting: *Deleted

## 2014-03-26 NOTE — Telephone Encounter (Signed)
Received request from pharmacy for PA on Protonix.   PA submitted.   Dx: K21.9, GERD.   Patient has tried and failed: Priolsec, Nexium, Zantac, Protonix (1) tab PO QD.

## 2014-03-28 NOTE — Telephone Encounter (Signed)
Received PA determination.   PA approved.  

## 2014-05-07 ENCOUNTER — Telehealth: Payer: Self-pay | Admitting: *Deleted

## 2014-05-07 NOTE — Telephone Encounter (Signed)
Received fax requesting refill on Ambien.   Ok to refill??  Last office visit 01/17/2014.  Last refill 12/19/2013, #3 refills.

## 2014-05-07 NOTE — Telephone Encounter (Signed)
Okay to refill? 

## 2014-05-08 ENCOUNTER — Ambulatory Visit: Payer: Self-pay | Admitting: Dietician

## 2014-05-08 ENCOUNTER — Other Ambulatory Visit: Payer: Self-pay | Admitting: *Deleted

## 2014-05-08 MED ORDER — ZOLPIDEM TARTRATE 10 MG PO TABS: 10.0000 mg | ORAL_TABLET | Freq: Every evening | ORAL | Status: DC | PRN

## 2014-05-08 MED ORDER — EZETIMIBE 10 MG PO TABS: ORAL_TABLET | ORAL | Status: DC

## 2014-05-08 NOTE — Telephone Encounter (Signed)
Received fax requesting refill on Zetia.   Refill appropriate and filled per protocol.

## 2014-05-08 NOTE — Telephone Encounter (Signed)
Medication called to pharmacy. 

## 2014-06-04 ENCOUNTER — Telehealth: Payer: Self-pay | Admitting: Family Medicine

## 2014-06-04 MED ORDER — PANTOPRAZOLE SODIUM 40 MG PO TBEC: 40.0000 mg | DELAYED_RELEASE_TABLET | Freq: Every day | ORAL | Status: DC

## 2014-06-04 NOTE — Telephone Encounter (Signed)
Prescription sent to pharmacy.

## 2014-06-04 NOTE — Telephone Encounter (Signed)
[phone]  Pt is needing a refill on pantoprazole (PROTONIX) 40 MG tablet [facility] [place]

## 2014-06-05 ENCOUNTER — Other Ambulatory Visit: Payer: Self-pay | Admitting: *Deleted

## 2014-06-05 MED ORDER — PANTOPRAZOLE SODIUM 40 MG PO TBEC: 40.0000 mg | DELAYED_RELEASE_TABLET | Freq: Every day | ORAL | Status: DC

## 2014-06-05 NOTE — Telephone Encounter (Signed)
Received fax requesting refill on Protonix.   Refill appropriate and filled per protocol.

## 2014-06-18 ENCOUNTER — Other Ambulatory Visit: Payer: Self-pay | Admitting: Family Medicine

## 2014-06-18 ENCOUNTER — Encounter: Payer: Self-pay | Admitting: Family Medicine

## 2014-06-18 ENCOUNTER — Ambulatory Visit (HOSPITAL_COMMUNITY)
Admission: RE | Admit: 2014-06-18 | Discharge: 2014-06-18 | Disposition: A | Discharge status: Home / Self Care | Payer: BC Managed Care – PPO | Source: Ambulatory Visit | Attending: Family Medicine | Admitting: Family Medicine

## 2014-06-18 ENCOUNTER — Ambulatory Visit (INDEPENDENT_AMBULATORY_CARE_PROVIDER_SITE_OTHER): Payer: BC Managed Care – PPO | Admitting: Family Medicine

## 2014-06-18 VITALS — BP 112/82 | HR 66 | Temp 98.6°F | Resp 18 | Ht 72.0 in | Wt 234.0 lb

## 2014-06-18 DIAGNOSIS — Z9049 Acquired absence of other specified parts of digestive tract: Secondary | ICD-10-CM | POA: Insufficient documentation

## 2014-06-18 DIAGNOSIS — R1031 Right lower quadrant pain: Secondary | ICD-10-CM | POA: Diagnosis not present

## 2014-06-18 DIAGNOSIS — Q600 Renal agenesis, unilateral: Secondary | ICD-10-CM | POA: Diagnosis not present

## 2014-06-18 LAB — CBC WITH DIFFERENTIAL/PLATELET
Basophils Absolute: 0.1 10*3/uL (ref 0.0–0.1)
Basophils Relative: 1 % (ref 0–1)
EOS ABS: 0.2 10*3/uL (ref 0.0–0.7)
Eosinophils Relative: 3 % (ref 0–5)
HCT: 45.8 % (ref 39.0–52.0)
HEMOGLOBIN: 15.6 g/dL (ref 13.0–17.0)
LYMPHS ABS: 2.2 10*3/uL (ref 0.7–4.0)
Lymphocytes Relative: 35 % (ref 12–46)
MCH: 30 pg (ref 26.0–34.0)
MCHC: 34.1 g/dL (ref 30.0–36.0)
MCV: 88.1 fL (ref 78.0–100.0)
MONOS PCT: 11 % (ref 3–12)
MPV: 9.5 fL (ref 8.6–12.4)
Monocytes Absolute: 0.7 10*3/uL (ref 0.1–1.0)
NEUTROS PCT: 50 % (ref 43–77)
Neutro Abs: 3.2 10*3/uL (ref 1.7–7.7)
Platelets: 220 10*3/uL (ref 150–400)
RBC: 5.2 MIL/uL (ref 4.22–5.81)
RDW: 13.9 % (ref 11.5–15.5)
WBC: 6.3 10*3/uL (ref 4.0–10.5)

## 2014-06-18 LAB — COMPREHENSIVE METABOLIC PANEL
ALBUMIN: 4.6 g/dL (ref 3.5–5.2)
ALT: 28 U/L (ref 0–53)
AST: 24 U/L (ref 0–37)
Alkaline Phosphatase: 67 U/L (ref 39–117)
BUN: 19 mg/dL (ref 6–23)
CALCIUM: 9.9 mg/dL (ref 8.4–10.5)
CO2: 31 meq/L (ref 19–32)
Chloride: 102 mEq/L (ref 96–112)
Creat: 1.09 mg/dL (ref 0.50–1.35)
GLUCOSE: 79 mg/dL (ref 70–99)
Potassium: 4.9 mEq/L (ref 3.5–5.3)
Sodium: 141 mEq/L (ref 135–145)
TOTAL PROTEIN: 6.9 g/dL (ref 6.0–8.3)
Total Bilirubin: 0.6 mg/dL (ref 0.2–1.2)

## 2014-06-18 MED ORDER — IOHEXOL 300 MG/ML  SOLN
100.0000 mL | Freq: Once | INTRAMUSCULAR | Status: AC | PRN
  Administered 2014-06-18: 100 mL via INTRAVENOUS
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
## 2014-06-18 NOTE — Patient Instructions (Signed)
We will call with CT scan We will call with results

## 2014-06-19 ENCOUNTER — Telehealth: Payer: Self-pay | Admitting: Family Medicine

## 2014-06-19 DIAGNOSIS — R1031 Right lower quadrant pain: Secondary | ICD-10-CM

## 2014-06-19 NOTE — Telephone Encounter (Signed)
-----   Message from Alycia Rossetti, MD sent at 06/18/2014  4:43 PM EDT ----- Call pt his scan shows small inguinal hernia on right side, he also has a small hiatal hernia, but this should not cause the symptoms in the right lower part of his abdomen. I would recommend he get a f/u appt with Dr. Gala Romney his GI

## 2014-06-19 NOTE — Telephone Encounter (Signed)
Pt aware of results.  Referral to Dr Gala Romney initiated

## 2014-06-19 NOTE — Telephone Encounter (Signed)
[phone] PT states he had been speaking to you about his results and he would like to speak to you again regarding them

## 2014-06-20 NOTE — Telephone Encounter (Signed)
Seeing Dr Gala Romney  April 4.

## 2014-07-13 ENCOUNTER — Ambulatory Visit (INDEPENDENT_AMBULATORY_CARE_PROVIDER_SITE_OTHER): Payer: BC Managed Care – PPO | Admitting: Gastroenterology

## 2014-07-13 ENCOUNTER — Encounter: Payer: Self-pay | Admitting: Gastroenterology

## 2014-07-13 ENCOUNTER — Other Ambulatory Visit: Payer: Self-pay

## 2014-07-13 VITALS — BP 140/85 | HR 77 | Temp 98.3°F | Ht 73.0 in | Wt 232.6 lb

## 2014-07-13 DIAGNOSIS — R1031 Right lower quadrant pain: Secondary | ICD-10-CM | POA: Diagnosis not present

## 2014-07-13 DIAGNOSIS — K921 Melena: Secondary | ICD-10-CM | POA: Insufficient documentation

## 2014-07-13 DIAGNOSIS — K5900 Constipation, unspecified: Secondary | ICD-10-CM

## 2014-07-13 MED ORDER — PEG 3350-KCL-NA BICARB-NACL 420 G PO SOLR: 4000.0000 mL | Freq: Once | ORAL | Status: DC

## 2014-07-13 MED ORDER — LINACLOTIDE 145 MCG PO CAPS: 145.0000 ug | ORAL_CAPSULE | Freq: Every day | ORAL | Status: DC
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
## 2014-07-13 NOTE — Patient Instructions (Signed)
I have provided a voucher to get a month supply of Linzess. This is a constipation medication. Take one capsule each morning ON AN EMPTY STOMACH at least 30 minutes before breakfast to avoid diarrhea.   I have scheduled you for a colonoscopy with Dr. Gala Romney.  We may need to refer you to Pain Management once this is completed, depending on the results.

## 2014-07-17 ENCOUNTER — Encounter: Payer: Self-pay | Admitting: Gastroenterology

## 2014-07-17 NOTE — Assessment & Plan Note (Signed)
54 year old male with chronic, intermittent RLQ pain in setting of constipation. Differentials broad to include secondary to constipation, possible adhesive disease, referred pain. Low-volume hematochezia in setting of constipation likely benign anorectal source but unable to exclude occult malignancy. As last colonoscopy in 2012 with adenoma, recommend early interval colonoscopy now.   More aggressive constipation regimen: start Linzess 145 mcg daily Proceed with TCS with Dr. Gala Romney in near future: the risks, benefits, and alternatives have been discussed with the patient in detail. The patient states understanding and desires to proceed.

## 2014-07-18 NOTE — Progress Notes (Signed)
CC'ED TO PCP 

## 2014-07-20 ENCOUNTER — Ambulatory Visit: Payer: BC Managed Care – PPO | Admitting: Family Medicine

## 2014-07-27 NOTE — Patient Instructions (Signed)
Derek [name]  07/27/2014   Your procedure is scheduled on:  Thursday, 08/02/14  Report to [facility] at [place] [facility].  Call this number if you have problems the morning of surgery: [phone]   Remember:   Do not eat food or drink liquids after midnight.   Take these medicines the morning of surgery with A SIP OF WATER: linzess, lyrica, percocet, protonix   Do not wear jewelry, make-up or nail polish.  Do not wear lotions, powders, or perfumes. You may wear deodorant.  Do not shave 48 hours prior to surgery. Men may shave face and neck.  Do not bring valuables to the hospital.  [facility] is not responsible                  for any belongings or valuables.               Contacts, dentures or bridgework may not be worn into surgery.  Leave suitcase in the car. After surgery it may be brought to your room.  For patients admitted to the hospital, discharge time is determined by your                treatment team.               Patients discharged the day of surgery will not be allowed to drive  home.  Name and phone number of your driver: family  Special Instructions: Please follow instructions given to you by Dr. Roseanne Kaufman office.   Please read over the following fact sheets that you were given: Anesthesia Post-op Instructions and Care and Recovery After Surgery  Colonoscopy, Care After These instructions give you information on caring for yourself after your procedure. Your doctor may also give you more specific instructions. Call your doctor if you have any problems or questions after your procedure. HOME CARE  Do not drive for 24 hours.  Do not sign important papers or use machinery for 24 hours.  You may shower.  You may go back to your usual activities, but go slower for the first 24 hours.  Take rest breaks often during the first 24 hours.  Walk around or use warm packs on your belly (abdomen) if you have belly cramping or gas.  Drink enough fluids to keep your pee (urine)  clear or pale yellow.  Resume your normal diet. Avoid heavy or fried foods.  Avoid drinking alcohol for 24 hours or as told by your doctor.  Only take medicines as told by your doctor. If a tissue sample (biopsy) was taken during the procedure:   Do not take aspirin or blood thinners for 7 days, or as told by your doctor.  Do not drink alcohol for 7 days, or as told by your doctor.  Eat soft foods for the first 24 hours. GET HELP IF: You still have a small amount of blood in your poop (stool) 2-3 days after the procedure. GET HELP RIGHT AWAY IF:  You have more than a small amount of blood in your poop.  You see clumps of tissue (blood clots) in your poop.  Your belly is puffy (swollen).  You feel sick to your stomach (nauseous) or throw up (vomit).  You have a fever.  You have belly pain that gets worse and medicine does not help. MAKE SURE YOU:  Understand these instructions.  Will watch your condition.  Will get help right away if you are not doing well or get worse. Document  Released: 04/25/2010 Document Revised: 03/28/2013 Document Reviewed: 11/28/2012 [facility] Patient Information 2015 [place], [place]. This information is not intended to replace advice given to you by your health care provider. Make sure you discuss any questions you have with your health care provider. PATIENT INSTRUCTIONS POST-ANESTHESIA  IMMEDIATELY FOLLOWING SURGERY:  Do not drive or operate machinery for the first twenty four hours after surgery.  Do not make any important decisions for twenty four hours after surgery or while taking narcotic pain medications or sedatives.  If you develop intractable nausea and vomiting or a severe headache please notify your doctor immediately.  FOLLOW-UP:  Please make an appointment with your surgeon as instructed. You do not need to follow up with anesthesia unless specifically instructed to do so.  WOUND CARE INSTRUCTIONS (if applicable):  Keep a dry clean  dressing on the anesthesia/puncture wound site if there is drainage.  Once the wound has quit draining you may leave it open to air.  Generally you should leave the bandage intact for twenty four hours unless there is drainage.  If the epidural site drains for more than 36-48 hours please call the anesthesia department.  QUESTIONS?:  Please feel free to call your physician or the hospital operator if you have any questions, and they will be happy to assist you.

## 2014-07-31 ENCOUNTER — Encounter (HOSPITAL_COMMUNITY): Payer: Self-pay

## 2014-07-31 ENCOUNTER — Other Ambulatory Visit: Payer: Self-pay

## 2014-07-31 ENCOUNTER — Encounter (HOSPITAL_COMMUNITY)
Admission: RE | Admit: 2014-07-31 | Discharge: 2014-07-31 | Disposition: A | Discharge status: Home / Self Care | Payer: BC Managed Care – PPO | Source: Ambulatory Visit | Attending: Internal Medicine | Admitting: Internal Medicine

## 2014-07-31 DIAGNOSIS — R1031 Right lower quadrant pain: Secondary | ICD-10-CM | POA: Diagnosis not present

## 2014-07-31 DIAGNOSIS — Z01810 Encounter for preprocedural cardiovascular examination: Secondary | ICD-10-CM | POA: Diagnosis not present

## 2014-07-31 HISTORY — DX: Sleep apnea, unspecified: G47.30

## 2014-07-31 NOTE — Pre-Procedure Instructions (Signed)
Patient given information to sign up for my chart at home. 

## 2014-08-02 ENCOUNTER — Ambulatory Visit (HOSPITAL_COMMUNITY): Payer: BC Managed Care – PPO | Admitting: Anesthesiology

## 2014-08-02 ENCOUNTER — Encounter (HOSPITAL_COMMUNITY)
Admission: RE | Disposition: A | Discharge status: Home / Self Care | Payer: Self-pay | Source: Ambulatory Visit | Attending: Internal Medicine

## 2014-08-02 ENCOUNTER — Encounter (HOSPITAL_COMMUNITY): Payer: Self-pay | Admitting: Anesthesiology

## 2014-08-02 ENCOUNTER — Ambulatory Visit (HOSPITAL_COMMUNITY)
Admission: RE | Admit: 2014-08-02 | Discharge: 2014-08-02 | Disposition: A | Discharge status: Home / Self Care | Payer: BC Managed Care – PPO | Source: Ambulatory Visit | Attending: Internal Medicine | Admitting: Internal Medicine

## 2014-08-02 DIAGNOSIS — N400 Enlarged prostate without lower urinary tract symptoms: Secondary | ICD-10-CM | POA: Diagnosis not present

## 2014-08-02 DIAGNOSIS — K648 Other hemorrhoids: Secondary | ICD-10-CM | POA: Diagnosis not present

## 2014-08-02 DIAGNOSIS — Z860100 Personal history of colon polyps, unspecified: Secondary | ICD-10-CM | POA: Insufficient documentation

## 2014-08-02 DIAGNOSIS — K5730 Diverticulosis of large intestine without perforation or abscess without bleeding: Secondary | ICD-10-CM | POA: Diagnosis not present

## 2014-08-02 DIAGNOSIS — F17290 Nicotine dependence, other tobacco product, uncomplicated: Secondary | ICD-10-CM | POA: Insufficient documentation

## 2014-08-02 DIAGNOSIS — G8929 Other chronic pain: Secondary | ICD-10-CM | POA: Diagnosis not present

## 2014-08-02 DIAGNOSIS — Z9049 Acquired absence of other specified parts of digestive tract: Secondary | ICD-10-CM | POA: Insufficient documentation

## 2014-08-02 DIAGNOSIS — E780 Pure hypercholesterolemia: Secondary | ICD-10-CM | POA: Diagnosis not present

## 2014-08-02 DIAGNOSIS — M545 Low back pain: Secondary | ICD-10-CM | POA: Insufficient documentation

## 2014-08-02 DIAGNOSIS — Z79891 Long term (current) use of opiate analgesic: Secondary | ICD-10-CM | POA: Insufficient documentation

## 2014-08-02 DIAGNOSIS — R1030 Lower abdominal pain, unspecified: Secondary | ICD-10-CM | POA: Diagnosis not present

## 2014-08-02 DIAGNOSIS — K219 Gastro-esophageal reflux disease without esophagitis: Secondary | ICD-10-CM | POA: Diagnosis not present

## 2014-08-02 DIAGNOSIS — E785 Hyperlipidemia, unspecified: Secondary | ICD-10-CM | POA: Diagnosis not present

## 2014-08-02 DIAGNOSIS — K921 Melena: Secondary | ICD-10-CM | POA: Diagnosis not present

## 2014-08-02 DIAGNOSIS — R1031 Right lower quadrant pain: Secondary | ICD-10-CM | POA: Diagnosis present

## 2014-08-02 DIAGNOSIS — M1990 Unspecified osteoarthritis, unspecified site: Secondary | ICD-10-CM | POA: Diagnosis not present

## 2014-08-02 DIAGNOSIS — G4730 Sleep apnea, unspecified: Secondary | ICD-10-CM | POA: Diagnosis not present

## 2014-08-02 DIAGNOSIS — Z79899 Other long term (current) drug therapy: Secondary | ICD-10-CM | POA: Diagnosis not present

## 2014-08-02 HISTORY — PX: COLONOSCOPY WITH PROPOFOL: SHX5780

## 2014-08-02 SURGERY — COLONOSCOPY WITH PROPOFOL
Anesthesia: Monitor Anesthesia Care

## 2014-08-02 MED ORDER — LIDOCAINE HCL (PF) 1 % IJ SOLN
INTRAMUSCULAR | Status: AC
  Filled 2014-08-02: qty 5

## 2014-08-02 MED ORDER — LACTATED RINGERS IV SOLN
INTRAVENOUS | Status: DC
  Administered 2014-08-02: 09:00:00 via INTRAVENOUS

## 2014-08-02 MED ORDER — MIDAZOLAM HCL 5 MG/5ML IJ SOLN
INTRAMUSCULAR | Status: DC | PRN
  Administered 2014-08-02 (×2): 1 mg via INTRAVENOUS

## 2014-08-02 MED ORDER — ONDANSETRON HCL 4 MG/2ML IJ SOLN
4.0000 mg | Freq: Once | INTRAMUSCULAR | Status: AC
  Administered 2014-08-02: 4 mg via INTRAVENOUS
  Filled 2014-08-02: qty 2

## 2014-08-02 MED ORDER — PROPOFOL 10 MG/ML IV BOLUS
INTRAVENOUS | Status: AC
  Filled 2014-08-02: qty 20

## 2014-08-02 MED ORDER — STERILE WATER FOR IRRIGATION IR SOLN
Status: DC | PRN
  Administered 2014-08-02: 1000 mL

## 2014-08-02 MED ORDER — EPHEDRINE SULFATE 50 MG/ML IJ SOLN
INTRAMUSCULAR | Status: AC
  Filled 2014-08-02: qty 1

## 2014-08-02 MED ORDER — FENTANYL CITRATE (PF) 100 MCG/2ML IJ SOLN: 25.0000 ug | INTRAMUSCULAR | Status: DC | PRN

## 2014-08-02 MED ORDER — MIDAZOLAM HCL 2 MG/2ML IJ SOLN
INTRAMUSCULAR | Status: AC
  Filled 2014-08-02: qty 2

## 2014-08-02 MED ORDER — ONDANSETRON HCL 4 MG/2ML IJ SOLN: 4.0000 mg | Freq: Once | INTRAMUSCULAR | Status: DC | PRN

## 2014-08-02 MED ORDER — FENTANYL CITRATE (PF) 100 MCG/2ML IJ SOLN
INTRAMUSCULAR | Status: AC
  Filled 2014-08-02: qty 2

## 2014-08-02 MED ORDER — SODIUM CHLORIDE 0.9 % IJ SOLN
INTRAMUSCULAR | Status: AC
  Filled 2014-08-02: qty 10

## 2014-08-02 MED ORDER — LIDOCAINE HCL (CARDIAC) 10 MG/ML IV SOLN
INTRAVENOUS | Status: DC | PRN
  Administered 2014-08-02: 50 mg via INTRAVENOUS

## 2014-08-02 MED ORDER — FENTANYL CITRATE (PF) 100 MCG/2ML IJ SOLN
INTRAMUSCULAR | Status: DC | PRN
  Administered 2014-08-02 (×2): 50 ug via INTRAVENOUS

## 2014-08-02 MED ORDER — FENTANYL CITRATE (PF) 100 MCG/2ML IJ SOLN
25.0000 ug | INTRAMUSCULAR | Status: AC
  Administered 2014-08-02 (×2): 25 ug via INTRAVENOUS

## 2014-08-02 MED ORDER — MIDAZOLAM HCL 2 MG/2ML IJ SOLN
1.0000 mg | INTRAMUSCULAR | Status: DC | PRN
  Administered 2014-08-02 (×2): 2 mg via INTRAVENOUS
  Filled 2014-08-02: qty 2

## 2014-08-02 MED ORDER — MEPERIDINE HCL 50 MG/ML IJ SOLN: 6.2500 mg | Freq: Once | INTRAMUSCULAR | Status: DC

## 2014-08-02 MED ORDER — PROPOFOL INFUSION 10 MG/ML OPTIME
INTRAVENOUS | Status: DC | PRN
  Administered 2014-08-02: 150 ug/kg/min via INTRAVENOUS

## 2014-08-02 MED ORDER — WATER FOR IRRIGATION, STERILE IR SOLN
Status: DC | PRN
  Administered 2014-08-02: 1000 mL

## 2014-08-02 MED ORDER — SUCCINYLCHOLINE CHLORIDE 20 MG/ML IJ SOLN
INTRAMUSCULAR | Status: AC
  Filled 2014-08-02: qty 1

## 2014-08-02 SURGICAL SUPPLY — 23 items
ELECT REM PT RETURN 9FT ADLT (ELECTROSURGICAL)
ELECTRODE REM PT RTRN 9FT ADLT (ELECTROSURGICAL) IMPLANT
FCP BXJMBJMB 240X2.8X (CUTTING FORCEPS)
FLOOR PAD 36X40 (MISCELLANEOUS)
FORCEPS BIOP RAD 4 LRG CAP 4 (CUTTING FORCEPS) ×3 IMPLANT
FORCEPS BIOP RJ4 240 W/NDL (CUTTING FORCEPS)
FORCEPS BXJMBJMB 240X2.8X (CUTTING FORCEPS) IMPLANT
FORMALIN 10 PREFIL 20ML (MISCELLANEOUS) IMPLANT
INJECTOR/SNARE I SNARE (MISCELLANEOUS) IMPLANT
KIT CLEAN ENDO COMPLIANCE (KITS) ×3 IMPLANT
LUBRICANT JELLY 4.5OZ STERILE (MISCELLANEOUS) ×3 IMPLANT
MANIFOLD NEPTUNE II (INSTRUMENTS) ×3 IMPLANT
NEEDLE SCLEROTHERAPY 25GX240 (NEEDLE) IMPLANT
PAD FLOOR 36X40 (MISCELLANEOUS) IMPLANT
PROBE APC STR FIRE (PROBE) IMPLANT
PROBE INJECTION GOLD (MISCELLANEOUS)
PROBE INJECTION GOLD 7FR (MISCELLANEOUS) IMPLANT
SNARE ROTATE MED OVAL 20MM (MISCELLANEOUS) IMPLANT
SNARE SHORT THROW 13M SML OVAL (MISCELLANEOUS) IMPLANT
SYR 50ML LL SCALE MARK (SYRINGE) ×3 IMPLANT
TRAP SPECIMEN MUCOUS 40CC (MISCELLANEOUS) IMPLANT
TUBING IRRIGATION ENDOGATOR (MISCELLANEOUS) ×3 IMPLANT
WATER STERILE IRR 1000ML POUR (IV SOLUTION) ×3 IMPLANT

## 2014-08-02 NOTE — Transfer of Care (Signed)
Immediate Anesthesia Transfer of Care Note  Patient: Derek [name]  Procedure(s) Performed: Procedure(s): COLONOSCOPY WITH PROPOFOL; IN CECUM AT 0933; WITHDRAWAL TIME 9 MINUTES (N/A)  Patient Location: PACU  Anesthesia Type:MAC  Level of Consciousness: awake, oriented and patient cooperative  Airway & Oxygen Therapy: Patient Spontanous Breathing and Patient connected to nasal cannula oxygen  Post-op Assessment: Report given to RN and Post -op Vital signs reviewed and stable  Post vital signs: Reviewed and stable  Last Vitals:  Filed Vitals:   08/02/14 0910  BP: 109/72  Pulse:   Temp:   Resp: 12    Complications: No apparent anesthesia complications

## 2014-08-02 NOTE — Anesthesia Postprocedure Evaluation (Signed)
  Anesthesia Post-op Note  Patient: Derek [name]  Procedure(s) Performed: Procedure(s): COLONOSCOPY WITH PROPOFOL; IN CECUM AT 0933; WITHDRAWAL TIME 9 MINUTES (N/A)  Patient Location: PACU  Anesthesia Type:MAC  Level of Consciousness: awake, alert , oriented and patient cooperative  Airway and Oxygen Therapy: Patient Spontanous Breathing and Patient connected to nasal cannula oxygen  Post-op Pain: none  Post-op Assessment: Post-op Vital signs reviewed, Patient's Cardiovascular Status Stable, Respiratory Function Stable, Patent Airway, No signs of Nausea or vomiting and Pain level controlled  Post-op Vital Signs: Reviewed and stable  Last Vitals:  Filed Vitals:   08/02/14 0910  BP: 109/72  Pulse:   Temp:   Resp: 12    Complications: No apparent anesthesia complications

## 2014-08-02 NOTE — Addendum Note (Signed)
Addendum  created 08/02/14 1038 by Mickel Baas, CRNA   Modules edited: Anesthesia Events, Anesthesia Responsible Staff
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
## 2014-08-02 NOTE — Op Note (Signed)
[facility] [address] [place], [address]   COLONOSCOPY PROCEDURE REPORT  PATIENT: Derek [name], Derek [name]  MR#: [id number] BIRTHDATE: 07/27/1960 , 28  yrs. old GENDER: male ENDOSCOPIST: R.  Garfield Cornea, MD FACP [facility] REFERRED ZP:[id number] [place], M.D. PROCEDURE DATE:  21-Aug-2014 PROCEDURE:   Ileo-colonoscopy, diagnostic and Colonoscopy, surveillance INDICATIONS:History of colonic adenoma; rare paper hematochezia in the setting constipation.  Right lower quadrant abdominal pain?"please note his abdominal pain, bleeding and pain essentially resolved on linzess 145 daily. MEDICATIONS: Deep sedation per Dr. Patsey Berthold [facility] ASA CLASS:       Class III  CONSENT: The risks, benefits, alternatives and imponderables including but not limited to bleeding, perforation as well as the possibility of a missed lesion have been reviewed.  The potential for biopsy, lesion removal, etc. have also been discussed. Questions have been answered.  All parties agreeable.  Please see the history and physical in the medical record for more information.  DESCRIPTION OF PROCEDURE:   After the risks benefits and alternatives of the procedure were thoroughly explained, informed consent was obtained.  The digital rectal exam revealed no abnormalities of the rectum.   The     endoscope was introduced through the anus and advanced to the terminal ileum which was intubated for a short distance. No adverse events experienced. The quality of the prep was adequate  The instrument was then slowly withdrawn as the colon was fully examined.      COLON FINDINGS: Internal hemorrhoids; otherwise, normal appearing rectal mucosa.  Elongated, redundant colon.  Scattered left-sided diverticula; the remainder of the colonic mucosa appeared normal. The distal 10 cm of terminal ileal mucosa also appeared normal. Retroflexion was performed. .  Withdrawal time=13 minutes 0 seconds.  The scope was  withdrawn and the procedure completed. COMPLICATIONS: There were no immediate complications.  ENDOSCOPIC IMPRESSION: Internal hemorrhoids. Colonic diverticulosis. Redundant colon.  RECOMMENDATIONS: Continue Linzess 145 daily. Repeat colonoscopy in 7 years for surveillance purposes. Office visit with [place] in 3 months.  eSigned:  R. Garfield Cornea, MD FACP [facility] Aug 21, 2014 10:00 AM   cc:  CPT CODES: ICD CODES:  The ICD and CPT codes recommended by this software are interpretations from the data that the clinical staff has captured with the software.  The verification of the translation of this report to the ICD and CPT codes and modifiers is the sole responsibility of the health care institution and practicing physician where this report was generated.  [employer]. will not be held responsible for the validity of the ICD and CPT codes included on this report.  AMA assumes no liability for data contained or not contained herein. CPT is a Designer, television/film set of the [employer].

## 2014-08-02 NOTE — Discharge Instructions (Signed)
Colonoscopy Discharge Instructions  Read the instructions outlined below and refer to this sheet in the next few weeks. These discharge instructions provide you with general information on caring for yourself after you leave the hospital. Your doctor may also give you specific instructions. While your treatment has been planned according to the most current medical practices available, unavoidable complications occasionally occur. If you have any problems or questions after discharge, call Dr. Gala Romney at [phone]. ACTIVITY  You may resume your regular activity, but move at a slower pace for the next 24 hours.   Take frequent rest periods for the next 24 hours.   Walking will help get rid of the air and reduce the bloated feeling in your belly (abdomen).   No driving for 24 hours (because of the medicine (anesthesia) used during the test).    Do not sign any important legal documents or operate any machinery for 24 hours (because of the anesthesia used during the test).  NUTRITION  Drink plenty of fluids.   You may resume your normal diet as instructed by your doctor.   Begin with a light meal and progress to your normal diet. Heavy or fried foods are harder to digest and may make you feel sick to your stomach (nauseated).   Avoid alcoholic beverages for 24 hours or as instructed.  MEDICATIONS  You may resume your normal medications unless your doctor tells you otherwise.  WHAT YOU CAN EXPECT TODAY  Some feelings of bloating in the abdomen.   Passage of more gas than usual.   Spotting of blood in your stool or on the toilet paper.  IF YOU HAD POLYPS REMOVED DURING THE COLONOSCOPY:  No aspirin products for 7 days or as instructed.   No alcohol for 7 days or as instructed.   Eat a soft diet for the next 24 hours.  FINDING OUT THE RESULTS OF YOUR TEST Not all test results are available during your visit. If your test results are not back during the visit, make an appointment  with your caregiver to find out the results. Do not assume everything is normal if you have not heard from your caregiver or the medical facility. It is important for you to follow up on all of your test results.  SEEK IMMEDIATE MEDICAL ATTENTION IF:  You have more than a spotting of blood in your stool.   Your belly is swollen (abdominal distention).   You are nauseated or vomiting.   You have a temperature over 101.   You have abdominal pain or discomfort that is severe or gets worse throughout the day.    Constipation, diverticulosis information provided  Continue Linzess 145 daily.  Office visit in 3 months    APPOINTMENT  July 28TH, 2016.  0900.  Plan for repeat colonoscopy for surveillance purposes in 7 years.      Diverticulosis Diverticulosis is the condition that develops when small pouches (diverticula) form in the wall of your colon. Your colon, or large intestine, is where water is absorbed and stool is formed. The pouches form when the inside layer of your colon pushes through weak spots in the outer layers of your colon. CAUSES  No one knows exactly what causes diverticulosis. RISK FACTORS  Being older than 61. Your risk for this condition increases with age. Diverticulosis is rare in people younger than 40 years. By age 63, almost everyone has it.  Eating a low-fiber diet.  Being frequently constipated.  Being overweight.  Not getting enough  exercise.  Smoking.  Taking over-the-counter pain medicines, like aspirin and ibuprofen. SYMPTOMS  Most people with diverticulosis do not have symptoms. DIAGNOSIS  Because diverticulosis often has no symptoms, health care providers often discover the condition during an exam for other colon problems. In many cases, a health care provider will diagnose diverticulosis while using a flexible scope to examine the colon (colonoscopy). TREATMENT  If you have never developed an infection related to diverticulosis, you may  not need treatment. If you have had an infection before, treatment may include:  Eating more fruits, vegetables, and grains.  Taking a fiber supplement.  Taking a live bacteria supplement (probiotic).  Taking medicine to relax your colon. HOME CARE INSTRUCTIONS   Drink at least 6-8 glasses of water each day to prevent constipation.  Try not to strain when you have a bowel movement.  Keep all follow-up appointments. If you have had an infection before:  Increase the fiber in your diet as directed by your health care provider or dietitian.  Take a dietary fiber supplement if your health care provider approves.  Only take medicines as directed by your health care provider. SEEK MEDICAL CARE IF:   You have abdominal pain.  You have bloating.  You have cramps.  You have not gone to the bathroom in 3 days. SEEK IMMEDIATE MEDICAL CARE IF:   Your pain gets worse.  Yourbloating becomes very bad.  You have a fever or chills, and your symptoms suddenly get worse.  You begin vomiting.  You have bowel movements that are bloody or black. MAKE SURE YOU:  Understand these instructions.  Will watch your condition.  Will get help right away if you are not doing well or get worse.    Constipation Constipation is when a person has fewer than three bowel movements a week, has difficulty having a bowel movement, or has stools that are dry, hard, or larger than normal. As people grow older, constipation is more common. If you try to fix constipation with medicines that make you have a bowel movement (laxatives), the problem may get worse. Long-term laxative use may cause the muscles of the colon to become weak. A low-fiber diet, not taking in enough fluids, and taking certain medicines may make constipation worse.  CAUSES   Certain medicines, such as antidepressants, pain medicine, iron supplements, antacids, and water pills.   Certain diseases, such as diabetes, irritable  bowel syndrome (IBS), thyroid disease, or depression.   Not drinking enough water.   Not eating enough fiber-rich foods.   Stress or travel.   Lack of physical activity or exercise.   Ignoring the urge to have a bowel movement.   Using laxatives too much.  SIGNS AND SYMPTOMS   Having fewer than three bowel movements a week.   Straining to have a bowel movement.   Having stools that are hard, dry, or larger than normal.   Feeling full or bloated.   Pain in the lower abdomen.   Not feeling relief after having a bowel movement.  DIAGNOSIS  Your health care provider will take a medical history and perform a physical exam. Further testing may be done for severe constipation. Some tests may include:  A barium enema X-ray to examine your rectum, colon, and, sometimes, your small intestine.   A sigmoidoscopy to examine your lower colon.   A colonoscopy to examine your entire colon. TREATMENT  Treatment will depend on the severity of your constipation and what is causing it. Some dietary  treatments include drinking more fluids and eating more fiber-rich foods. Lifestyle treatments may include regular exercise. If these diet and lifestyle recommendations do not help, your health care provider may recommend taking over-the-counter laxative medicines to help you have bowel movements. Prescription medicines may be prescribed if over-the-counter medicines do not work.  HOME CARE INSTRUCTIONS   Eat foods that have a lot of fiber, such as fruits, vegetables, whole grains, and beans.  Limit foods high in fat and processed sugars, such as french fries, hamburgers, cookies, candies, and soda.   A fiber supplement may be added to your diet if you cannot get enough fiber from foods.   Drink enough fluids to keep your urine clear or pale yellow.   Exercise regularly or as directed by your health care provider.   Go to the restroom when you have the urge to go. Do not hold  it.   Only take over-the-counter or prescription medicines as directed by your health care provider. Do not take other medicines for constipation without talking to your health care provider first.  [place] IF:   You have bright red blood in your stool.   Your constipation lasts for more than 4 days or gets worse.   You have abdominal or rectal pain.   You have thin, pencil-like stools.   You have unexplained weight loss. MAKE SURE YOU:   Understand these instructions.  Will watch your condition.  Will get help right away if you are not doing well or get worse. Document Released: 12/20/2003 Document Revised: 03/28/2013 Document Reviewed: 01/02/2013 [facility] Patient Information 2015 [place], [place]. This information is not intended to replace advice given to you by your health care provider. Make sure you discuss any questions you have with your health care provider.

## 2014-08-02 NOTE — Anesthesia Procedure Notes (Signed)
Procedure Name: MAC Date/Time: 08/02/2014 9:16 AM Performed by: Andree Elk, Christol Thetford A Pre-anesthesia Checklist: Patient identified, Timeout performed, Emergency Drugs available, Suction available and Patient being monitored Patient Re-evaluated:Patient Re-evaluated prior to inductionOxygen Delivery Method: Simple face mask

## 2014-08-02 NOTE — Interval H&P Note (Signed)
History and Physical Interval Note:  08/02/2014 9:12 AM  Derek [name]  has presented today for surgery, with the diagnosis of RLQ pain, hematochezia  The various methods of treatment have been discussed with the patient and family. After consideration of risks, benefits and other options for treatment, the patient has consented to  Procedure(s) with comments: COLONOSCOPY WITH PROPOFOL (N/A) - 800 - moved to 9:15 as a surgical intervention .  The patient's history has been reviewed, patient examined, no change in status, stable for surgery.  I have reviewed the patient's chart and labs.  Questions were answered to the patient's satisfaction.     Derek [name]  abdominal pain  has resolved with the addition of Linzess to his regimen. Bowel function has normalized as well. Colonoscopy now being done per plan.  The risks, benefits, limitations, alternatives and imponderables have been reviewed with the patient. Questions have been answered. All parties are agreeable.

## 2014-08-02 NOTE — Anesthesia Preprocedure Evaluation (Addendum)
Anesthesia Evaluation  Patient identified by MRN, date of birth, ID band Patient awake    Reviewed: Allergy & Precautions, NPO status , Patient's Chart, lab work & pertinent test results  Airway Mallampati: I  TM Distance: >3 FB     Dental  (+) Teeth Intact   Pulmonary sleep apnea , former smoker,  breath sounds clear to auscultation        Cardiovascular negative cardio ROS  Rhythm:Regular Rate:Normal     Neuro/Psych    GI/Hepatic GERD-  Medicated,  Endo/Other    Renal/GU Solitary kidney      Musculoskeletal   Abdominal   Peds  Hematology   Anesthesia Other Findings   Reproductive/Obstetrics                            Anesthesia Physical Anesthesia Plan  ASA: II  Anesthesia Plan: MAC   Post-op Pain Management:    Induction: Intravenous  Airway Management Planned: Simple Face Mask  Additional Equipment:   Intra-op Plan:   Post-operative Plan:   Informed Consent: I have reviewed the patients History and Physical, chart, labs and discussed the procedure including the risks, benefits and alternatives for the proposed anesthesia with the patient or authorized representative who has indicated his/her understanding and acceptance.     Plan Discussed with:   Anesthesia Plan Comments:         Anesthesia Quick Evaluation

## 2014-08-03 ENCOUNTER — Encounter (HOSPITAL_COMMUNITY): Payer: Self-pay | Admitting: Internal Medicine

## 2014-08-09 ENCOUNTER — Ambulatory Visit (INDEPENDENT_AMBULATORY_CARE_PROVIDER_SITE_OTHER): Payer: BC Managed Care – PPO | Admitting: Physician Assistant

## 2014-08-09 ENCOUNTER — Encounter: Payer: Self-pay | Admitting: Physician Assistant

## 2014-08-09 VITALS — BP 126/94 | HR 56 | Temp 98.4°F | Resp 18 | Wt 230.0 lb

## 2014-08-09 DIAGNOSIS — J029 Acute pharyngitis, unspecified: Secondary | ICD-10-CM

## 2014-08-09 DIAGNOSIS — J020 Streptococcal pharyngitis: Secondary | ICD-10-CM | POA: Diagnosis not present

## 2014-08-09 DIAGNOSIS — B9689 Other specified bacterial agents as the cause of diseases classified elsewhere: Secondary | ICD-10-CM

## 2014-08-09 DIAGNOSIS — J988 Other specified respiratory disorders: Secondary | ICD-10-CM | POA: Diagnosis not present

## 2014-08-09 LAB — RAPID STREP SCREEN (MED CTR MEBANE ONLY): Streptococcus, Group A Screen (Direct): POSITIVE — AB

## 2014-08-09 MED ORDER — AZITHROMYCIN 250 MG PO TABS: ORAL_TABLET | ORAL | Status: DC

## 2014-08-09 MED ORDER — AMOXICILLIN-POT CLAVULANATE 500-125 MG PO TABS: 1.0000 | ORAL_TABLET | Freq: Three times a day (TID) | ORAL | Status: DC

## 2014-08-09 NOTE — Progress Notes (Signed)
Patient ID: CHAEL URENDA MRN: [id number], DOB: 1961-03-19, 54 y.o. Date of Encounter: 08/09/2014, 2:49 PM    Chief Complaint:  Chief Complaint  Patient presents with  . sick x 4 days    head congestion, facial pain, sore throat,  taking tylenol sinus     HPI: 54 y.o. year old white male since the symptoms above.  Says that he has been coughing up phlegm that tastes nasty. Says that he coughs up more of this first thing in the morning. Says that he has been having head and nasal congestion but now is afraid it is going into his chest. Also was sore throat. Says that he has been sick for 5 days and symptoms are worsening. Says that in addition to taking the Tylenol sinus he is also use Alka-Seltzer cold. Has not checked temperature with a thermometer no known fevers or chills.     Home Meds:   Outpatient Prescriptions Prior to Visit  Medication Sig Dispense Refill  . Ascorbic Acid (VITAMIN C) 100 MG tablet Take 100 mg by mouth daily.    . Cyanocobalamin (VITAMIN B 12 PO) Take 1 tablet by mouth daily.     [name] Kitchen ezetimibe (ZETIA) 10 MG tablet take 1 tablet by mouth once daily 30 tablet 3  . GLUCOS-CHON-MSM-CA-C-CTCL-SECU PO Take 1 tablet by mouth daily.    [name] Kitchen KRILL OIL 1000 MG CAPS Take 1 capsule by mouth daily.    . Linaclotide (LINZESS) 145 MCG CAPS capsule Take 1 capsule (145 mcg total) by mouth daily. On an empty stomach at least 30 minutes before breakfast 30 capsule 3  . LYRICA 150 MG capsule Take 150 mg by mouth 2 (two) times daily.   0  . methocarbamol (ROBAXIN) 500 MG tablet Take 500 mg by mouth every 6 (six) hours as needed for muscle spasms.    . Multiple Vitamin (MULTIVITAMIN) tablet Take 1 tablet by mouth daily.      [name] Kitchen oxyCODONE-acetaminophen (PERCOCET/ROXICET) 5-325 MG per tablet Take 1 tablet by mouth every 8 (eight) hours as needed for severe pain.    . pantoprazole (PROTONIX) 40 MG tablet Take 1 tablet (40 mg total) by mouth daily. Take 2 tablets daily 60 tablet 3  .  zolpidem (AMBIEN) 10 MG tablet Take 10 mg by mouth at bedtime as needed for sleep.    . penicillin v potassium (VEETID) 500 MG tablet Take 500 mg by mouth 3 (three) times daily.  0  . polyethylene glycol-electrolytes (NULYTELY/GOLYTELY) 420 G solution Take 4,000 mLs by mouth once. 4000 mL 0  . zolpidem (AMBIEN) 10 MG tablet Take 1 tablet (10 mg total) by mouth at bedtime as needed for sleep. 30 tablet 3   No facility-administered medications prior to visit.    Allergies: No Known Allergies    Review of Systems: See HPI for pertinent ROS. All other ROS negative.    Physical Exam: Blood pressure 126/94, pulse 56, temperature 98.4 F (36.9 C), temperature source Oral, resp. rate 18, weight 230 lb (104.327 kg)., Body mass index is 30.35 kg/(m^2). General: Overweight white male. Appears in no acute distress. HEENT: Normocephalic, atraumatic, eyes without discharge, sclera non-icteric, nares are without discharge. Bilateral auditory canals clear, TM's are without perforation, pearly grey and translucent with reflective cone of light bilaterally. Oral cavity moist, posterior pharynx with mild erythema but no exudate, no peritonsillar abscess. No sinus tenderness with percussion of frontal and maxillary sinuses bilaterally.  Neck: Supple. No thyromegaly. No lymphadenopathy. Lungs: Clear  bilaterally to auscultation without wheezes, rales, or rhonchi. Breathing is unlabored. Heart: Regular rhythm. No murmurs, rubs, or gallops. Msk:  Strength and tone normal for age. Extremities/Skin: Warm and dry.  No rashes. Neuro: Alert and oriented X 3. Moves all extremities spontaneously. Gait is normal. CNII-XII grossly in tact. Psych:  Responds to questions appropriately with a normal affect.      ASSESSMENT AND PLAN:  54 y.o. year old male with  1. Strep pharyngitis Strep test positive. Tell patient to start antibiotics immediately and take as directed and complete all of it. In use lozenges spray  Tylenol Motrin as needed for symptom relief and pain relief of sore throat. Also can use decongestants and cough medications as needed for symptom relief. Follow-up if symptoms worsen or do not resolve with completion of antibiotic. - amoxicillin-clavulanate (AUGMENTIN) 500-125 MG per tablet; Take 1 tablet (500 mg total) by mouth 3 (three) times daily.  Dispense: 30 tablet; Refill: 0  2. Bacterial respiratory infection  3. Sorethroat - Rapid strep screen    Signed, Olean Ree Salome, [place], [facility] 08/09/2014 2:49 PM

## 2014-08-14 ENCOUNTER — Ambulatory Visit (INDEPENDENT_AMBULATORY_CARE_PROVIDER_SITE_OTHER): Payer: BC Managed Care – PPO | Admitting: Family Medicine

## 2014-08-14 ENCOUNTER — Encounter: Payer: Self-pay | Admitting: Family Medicine

## 2014-08-14 VITALS — BP 100/66 | HR 60 | Temp 98.3°F | Resp 18 | Wt 226.0 lb

## 2014-08-14 DIAGNOSIS — E781 Pure hyperglyceridemia: Secondary | ICD-10-CM

## 2014-08-14 DIAGNOSIS — K219 Gastro-esophageal reflux disease without esophagitis: Secondary | ICD-10-CM | POA: Diagnosis not present

## 2014-08-14 DIAGNOSIS — G4730 Sleep apnea, unspecified: Secondary | ICD-10-CM

## 2014-08-14 DIAGNOSIS — E669 Obesity, unspecified: Secondary | ICD-10-CM

## 2014-08-14 DIAGNOSIS — J069 Acute upper respiratory infection, unspecified: Secondary | ICD-10-CM | POA: Diagnosis not present

## 2014-08-14 LAB — LIPID PANEL
Cholesterol: 149 mg/dL (ref 0–200)
HDL: 33 mg/dL — AB (ref >=40)
LDL Cholesterol: 90 mg/dL (ref 0–99)
TRIGLYCERIDES: 132 mg/dL (ref <150)
Total CHOL/HDL Ratio: 4.5 Ratio
VLDL: 26 mg/dL (ref 0–40)

## 2014-08-14 LAB — COMPREHENSIVE METABOLIC PANEL
ALBUMIN: 4.5 g/dL (ref 3.5–5.2)
ALT: 38 U/L (ref 0–53)
AST: 27 U/L (ref 0–37)
Alkaline Phosphatase: 59 U/L (ref 39–117)
BILIRUBIN TOTAL: 0.5 mg/dL (ref 0.2–1.2)
BUN: 23 mg/dL (ref 6–23)
CO2: 29 mEq/L (ref 19–32)
Calcium: 9.9 mg/dL (ref 8.4–10.5)
Chloride: 102 mEq/L (ref 96–112)
Creat: 1.11 mg/dL (ref 0.50–1.35)
GLUCOSE: 87 mg/dL (ref 70–99)
Potassium: 5.3 mEq/L (ref 3.5–5.3)
Sodium: 141 mEq/L (ref 135–145)
TOTAL PROTEIN: 6.8 g/dL (ref 6.0–8.3)

## 2014-08-14 LAB — CBC WITH DIFFERENTIAL/PLATELET
BASOS ABS: 0 10*3/uL (ref 0.0–0.1)
Basophils Relative: 0 % (ref 0–1)
Eosinophils Absolute: 0.3 10*3/uL (ref 0.0–0.7)
Eosinophils Relative: 5 % (ref 0–5)
HCT: 45.1 % (ref 39.0–52.0)
HEMOGLOBIN: 15.1 g/dL (ref 13.0–17.0)
LYMPHS PCT: 41 % (ref 12–46)
Lymphs Abs: 2.1 10*3/uL (ref 0.7–4.0)
MCH: 30 pg (ref 26.0–34.0)
MCHC: 33.5 g/dL (ref 30.0–36.0)
MCV: 89.7 fL (ref 78.0–100.0)
MONO ABS: 0.6 10*3/uL (ref 0.1–1.0)
MONOS PCT: 12 % (ref 3–12)
MPV: 9.4 fL (ref 8.6–12.4)
NEUTROS ABS: 2.1 10*3/uL (ref 1.7–7.7)
Neutrophils Relative %: 42 % — ABNORMAL LOW (ref 43–77)
Platelets: 213 10*3/uL (ref 150–400)
RBC: 5.03 MIL/uL (ref 4.22–5.81)
RDW: 13.9 % (ref 11.5–15.5)
WBC: 5 10*3/uL (ref 4.0–10.5)

## 2014-08-14 NOTE — Assessment & Plan Note (Signed)
Reviewed GI note, continue med

## 2014-08-14 NOTE — Progress Notes (Signed)
Patient ID: Derek [name], male   DOB: Dec 11, 1960, 54 y.o.   MRN: [id number]   Subjective:    Patient ID: Derek [name], male    DOB: 03-06-1961, 54 y.o.   MRN: [id number]  Patient presents for 6 mth check up  patient here to follow-up chronic medical problems. He is due for repeat fasting labs for his hypertriglyceridemia. He is still being followed by his pain clinic doctor. He was seen a week ago secondary to sore throat cough congestion fever he was diagnosed with strep pharyngitis his throat has improved however he continues to cough but it is mostly nonproductive. He is concerned that he had pneumonia. He has not had any further fever. Denies any shortness of breath. Note he took a Z-Pak and is now on Augmentin 500/125mg  3 times a day. Using Cepacol cough drops  Had recent normal colonoscopy  Has f/u with neurology for his OSA    Review Of Systems:  GEN- denies fatigue, fever, weight loss,weakness, recent illness HEENT- denies eye drainage, change in vision, nasal discharge, CVS- denies chest pain, palpitations RESP- denies SOB, +cough, wheeze ABD- denies N/V, change in stools, abd pain GU- denies dysuria, hematuria, dribbling, incontinence MSK- + joint pain, muscle aches, injury Neuro- denies headache, dizziness, syncope, seizure activity       Objective:    BP 100/66 mmHg  Pulse 60  Temp(Src) 98.3 F (36.8 C) (Oral)  Resp 18  Wt 226 lb (102.513 kg) GEN- NAD, alert and oriented x3 HEENT- PERRL, EOMI, non injected sclera, pink conjunctiva, MMM, oropharynx clear, +clear rhinorrhea, no maxillary sinus tenderness Neck- Supple, no LAD CVS- RRR, no murmur RESP-CTAB EXT- No edema Pulses- Radial, DP- 2+        Assessment & Plan:      Problem List Items Addressed This Visit    Sleep apnea - Primary   Hypertriglyceridemia   Relevant Orders   CBC with Differential/Platelet   Comprehensive metabolic panel   Lipid panel   GERD (gastroesophageal reflux disease)     Other Visit Diagnoses    Acute URI        complete antibiotics, mucinex DM       Note: This dictation was prepared with Dragon dictation along with smaller phrase technology. Any transcriptional errors that result from this process are unintentional.

## 2014-08-14 NOTE — Assessment & Plan Note (Signed)
Recheck labs on zetia and fish oil

## 2014-08-14 NOTE — Patient Instructions (Signed)
Complete antibiotics Take Mucinex DM  Continue all other medications F/U 6 months for PHYSICAL

## 2014-08-14 NOTE — Assessment & Plan Note (Signed)
Continues to benefit from CPAP Machine, he is considering changing to nasal mask

## 2014-09-04 ENCOUNTER — Other Ambulatory Visit: Payer: Self-pay | Admitting: *Deleted

## 2014-09-04 MED ORDER — EZETIMIBE 10 MG PO TABS: ORAL_TABLET | ORAL | Status: DC

## 2014-09-04 NOTE — Telephone Encounter (Signed)
Received fax requesting refill on Zetia.   Refill appropriate and filled per protocol.

## 2014-10-02 ENCOUNTER — Encounter: Payer: Self-pay | Admitting: Family Medicine

## 2014-10-03 ENCOUNTER — Ambulatory Visit (INDEPENDENT_AMBULATORY_CARE_PROVIDER_SITE_OTHER): Payer: BC Managed Care – PPO | Admitting: Physician Assistant

## 2014-10-03 ENCOUNTER — Encounter: Payer: Self-pay | Admitting: Physician Assistant

## 2014-10-03 VITALS — BP 130/80 | HR 78 | Temp 97.6°F | Resp 18 | Wt 231.0 lb

## 2014-10-03 DIAGNOSIS — T148 Other injury of unspecified body region: Secondary | ICD-10-CM | POA: Diagnosis not present

## 2014-10-03 DIAGNOSIS — W57XXXA Bitten or stung by nonvenomous insect and other nonvenomous arthropods, initial encounter: Secondary | ICD-10-CM | POA: Diagnosis not present

## 2014-10-03 MED ORDER — DOXYCYCLINE HYCLATE 100 MG PO TABS: 100.0000 mg | ORAL_TABLET | Freq: Two times a day (BID) | ORAL | Status: DC

## 2014-10-03 MED ORDER — ZOLPIDEM TARTRATE 10 MG PO TABS: 10.0000 mg | ORAL_TABLET | Freq: Every evening | ORAL | Status: DC | PRN

## 2014-10-03 NOTE — Progress Notes (Signed)
Patient ID: Derek [name] MRN: [id number], DOB: Oct 14, 1960, 54 y.o. Date of Encounter: 10/03/2014, 9:49 AM    Chief Complaint:  Chief Complaint  Patient presents with  . OTHER    was biten by tick week ago on stomach and raised and red, wife concermed,  . Medication Refill    [name]     HPI: 54 y.o. year old white male reports above symptoms. Says that he found a tick on his upper abdomen about one week ago. Skin around the site is a little red and raised and his wife told him he needed to come in. Says it's also been itchy.  Has had no other type of rash or other areas of rash. Has had no fever no malaise no myalgias no headache no abdominal pain nausea vomiting or diarrhea.     Home Meds:   Outpatient Prescriptions Prior to Visit  Medication Sig Dispense Refill  . Ascorbic Acid (VITAMIN C) 100 MG tablet Take 100 mg by mouth daily.    . Cyanocobalamin (VITAMIN B 12 PO) Take 1 tablet by mouth daily.     [name] Kitchen ezetimibe (ZETIA) 10 MG tablet take 1 tablet by mouth once daily 30 tablet 3  . GLUCOS-CHON-MSM-CA-C-CTCL-SECU PO Take 1 tablet by mouth daily.    [name] Kitchen KRILL OIL 1000 MG CAPS Take 1 capsule by mouth daily.    . Linaclotide (LINZESS) 145 MCG CAPS capsule Take 1 capsule (145 mcg total) by mouth daily. On an empty stomach at least 30 minutes before breakfast 30 capsule 3  . LYRICA 150 MG capsule Take 150 mg by mouth 2 (two) times daily.   0  . Multiple Vitamin (MULTIVITAMIN) tablet Take 1 tablet by mouth daily.      [name] Kitchen oxyCODONE-acetaminophen (PERCOCET/ROXICET) 5-325 MG per tablet Take 1 tablet by mouth every 8 (eight) hours as needed for severe pain.    . pantoprazole (PROTONIX) 40 MG tablet Take 1 tablet (40 mg total) by mouth daily. Take 2 tablets daily 60 tablet 3  . zolpidem (AMBIEN) 10 MG tablet Take 10 mg by mouth at bedtime as needed for sleep.    [name] Kitchen amoxicillin-clavulanate (AUGMENTIN) 500-125 MG per tablet Take 1 tablet (500 mg total) by mouth 3 (three) times daily.  (Patient not taking: Reported on 10/03/2014) 30 tablet 0  . methocarbamol (ROBAXIN) 500 MG tablet Take 500 mg by mouth every 6 (six) hours as needed for muscle spasms.     No facility-administered medications prior to visit.    Allergies: No Known Allergies    Review of Systems: See HPI for pertinent ROS. All other ROS negative.    Physical Exam: Blood pressure 130/80, pulse 78, temperature 97.6 F (36.4 C), temperature source Oral, resp. rate 18, weight 231 lb (104.781 kg)., Body mass index is 30.48 kg/(m^2). General: Overweight white male Appears in no acute distress. Neck: Supple. No thyromegaly. No lymphadenopathy. Lungs: Clear bilaterally to auscultation without wheezes, rales, or rhonchi. Breathing is unlabored. Heart: Regular rhythm. No murmurs, rubs, or gallops. Msk:  Strength and tone normal for age. Skin: Upper Abdomen, Midline--There is a 3mm scab. There is approx 1 cm diameter area of erythema --the scabbed area is in the center of this.  No other rash.  No target lesion/bulls eye lesion to indicate erythema migrans.  Neuro: Alert and oriented X 3. Moves all extremities spontaneously. Gait is normal. CNII-XII grossly in tact. Psych:  Responds to questions appropriately with a normal affect.     ASSESSMENT  AND PLAN:  54 y.o. year old male with  1. Tick bite Suspect that the tick bite is just inflamed but will go ahead and cover for possible secondary infection and even possible Lyme/[place] spotted fever. Discussed titers with patient but he defers. Old him to call us if he were to develop any of the other symptoms that we discussed in history of present illness. - doxycycline (VIBRA-TABS) 100 MG tablet; Take 1 tablet (100 mg total) by mouth 2 (two) times daily.  Dispense: 20 tablet; Refill: 0   Signed, [address] [place], [place], [facility] 10/03/2014 9:49 AM

## 2014-10-30 ENCOUNTER — Encounter: Payer: Self-pay | Admitting: Family Medicine

## 2014-10-30 ENCOUNTER — Ambulatory Visit (INDEPENDENT_AMBULATORY_CARE_PROVIDER_SITE_OTHER): Payer: BC Managed Care – PPO | Admitting: Family Medicine

## 2014-10-30 VITALS — BP 122/68 | HR 70 | Temp 98.3°F | Resp 14 | Ht 72.0 in | Wt 231.0 lb

## 2014-10-30 DIAGNOSIS — J020 Streptococcal pharyngitis: Secondary | ICD-10-CM

## 2014-10-30 LAB — RAPID STREP SCREEN (MED CTR MEBANE ONLY): Streptococcus, Group A Screen (Direct): POSITIVE — AB

## 2014-10-30 MED ORDER — FIRST-DUKES MOUTHWASH MT SUSP: OROMUCOSAL | Status: DC

## 2014-10-30 MED ORDER — AMOXICILLIN 500 MG PO CAPS
500.0000 mg | ORAL_CAPSULE | Freq: Two times a day (BID) | ORAL | Status: DC
Start: 2014-10-30 — End: 2014-11-19

## 2014-10-30 NOTE — Patient Instructions (Signed)
Take antibiotics as prescribed Use mouthwash or salt water rinse F/U as needed

## 2014-10-30 NOTE — Progress Notes (Signed)
Patient ID: Derek [name], male   DOB: 14-Dec-1960, 54 y.o.   MRN: [id number]   Subjective:    Patient ID: Derek [name], male    DOB: May 14, 1960, 54 y.o.   MRN: [id number]  Patient presents for Illness  here with sore throat swollen lymph nodes for the past 3 days. No known sick contacts. No fever no cough no congestion no headache. He has not used any over-the-counter medications    Review Of Systems: per above   GEN- denies fatigue, fever, weight loss,weakness, recent illness HEENT- denies eye drainage, change in vision, nasal discharge, CVS- denies chest pain, palpitations RESP- denies SOB, cough, wheeze ABD- denies N/V, change in stools, abd pain GU- denies dysuria, hematuria, dribbling, incontinence MSK- denies joint pain, muscle aches, injury Neuro- denies headache, dizziness, syncope, seizure activity       Objective:    BP 122/68 mmHg  Pulse 70  Temp(Src) 98.3 F (36.8 C) (Oral)  Resp 14  Ht 6' (1.829 m)  Wt 231 lb (104.781 kg)  BMI 31.32 kg/m2 GEN- NAD, alert and oriented x3 HEENT- PERRL, EOMI, non injected sclera, pink conjunctiva, MMM, oropharynx injected, no exduates,  Neck- Supple, + shotty anterior LAD CVS- RRR, no murmur RESP-CTAB Pulses- Radial - 2+        Assessment & Plan:      Problem List Items Addressed This Visit    None    Visit Diagnoses    Strep pharyngitis    -  Primary    Strep positive, amox x 10 days, magic mouthwash    Relevant Medications    Diphenhyd-Hydrocort-Nystatin (FIRST-DUKES MOUTHWASH) SUSP    Other Relevant Orders    Rapid Strep Screen (Completed)       Note: This dictation was prepared with Dragon dictation along with smaller phrase technology. Any transcriptional errors that result from this process are unintentional.

## 2014-11-01 ENCOUNTER — Ambulatory Visit (INDEPENDENT_AMBULATORY_CARE_PROVIDER_SITE_OTHER): Payer: BC Managed Care – PPO | Admitting: Nurse Practitioner

## 2014-11-01 ENCOUNTER — Encounter: Payer: Self-pay | Admitting: Nurse Practitioner

## 2014-11-01 VITALS — BP 118/75 | HR 76 | Temp 98.3°F | Ht 72.0 in | Wt 226.0 lb

## 2014-11-01 DIAGNOSIS — R1031 Right lower quadrant pain: Secondary | ICD-10-CM | POA: Diagnosis not present

## 2014-11-01 DIAGNOSIS — K5900 Constipation, unspecified: Secondary | ICD-10-CM | POA: Diagnosis not present

## 2014-11-01 NOTE — Patient Instructions (Signed)
1. Keep taking the Linzess as you have been taking. 2. We will send you a letter when it is time for your repeat colonoscopy in 7 years (2023). Follow-up as needed for any change or recurrent symptoms.
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
## 2014-11-01 NOTE — Assessment & Plan Note (Signed)
Constipation symptoms essentially resolved on Linzess 145 g. States he has a bowel movement every other day or up to every day which is soft, formed, and does not require straining. Colonoscopy essentially normal. We'll ensure he is on a recall for surveillance in 7 years otherwise return as needed for recurrent symptoms.

## 2014-11-01 NOTE — Assessment & Plan Note (Signed)
54 year old male who was previously seen for right lower quadrant abdominal pain and constipation. Was started on Linzess 145 g and his symptoms have subsequently resolved. His colonoscopy was essentially normal except for colonic diverticulosis and internal hemorrhoids. States she is doing very well her symptoms is completely resolved which he attributes majority to his diet changes. We'll ensure he is on recall list for 7 year repeat surveillance colonoscopy. Return as needed for any recurrent symptoms.

## 2014-11-05 NOTE — Progress Notes (Signed)
CC'ED TO PCP 

## 2014-11-19 ENCOUNTER — Ambulatory Visit (INDEPENDENT_AMBULATORY_CARE_PROVIDER_SITE_OTHER): Payer: BC Managed Care – PPO | Admitting: Family Medicine

## 2014-11-19 ENCOUNTER — Encounter: Payer: Self-pay | Admitting: Family Medicine

## 2014-11-19 VITALS — BP 124/60 | HR 70 | Temp 97.5°F | Resp 16 | Ht 72.0 in | Wt 228.0 lb

## 2014-11-19 DIAGNOSIS — J020 Streptococcal pharyngitis: Secondary | ICD-10-CM

## 2014-11-19 LAB — RAPID STREP SCREEN (MED CTR MEBANE ONLY): Streptococcus, Group A Screen (Direct): POSITIVE — AB

## 2014-11-19 MED ORDER — CEFDINIR 300 MG PO CAPS: 300.0000 mg | ORAL_CAPSULE | Freq: Two times a day (BID) | ORAL | Status: DC

## 2014-11-19 NOTE — Progress Notes (Signed)
Patient ID: Derek [name], male   DOB: September 29, 1960, 54 y.o.   MRN: [id number]   Subjective:    Patient ID: Derek [name], male    DOB: 01/30/1961, 54 y.o.   MRN: [id number]  Patient presents for F/U Strep  patient here to follow-up strep. He was treated for strep throat about 3 weeks ago and gave him a ten-day course of amoxicillin. Most of his symptoms have improved but he still has a scratchy throat. He had to delay an epidural injection because he had an active infection. He is here today to make sure the infection has cleared since he still has some mild symptoms. He has not had any fever no known sick contacts no cough congestion or headache.    Review Of Systems: per above   GEN- denies fatigue, fever, weight loss,weakness, recent illness HEENT- denies eye drainage, change in vision, nasal discharge, CVS- denies chest pain, palpitations RESP- denies SOB, cough, wheeze ABD- denies N/V, change in stools, abd pain GU- denies dysuria, hematuria, dribbling, incontinence MSK- + joint pain, muscle aches, injury Neuro- denies headache, dizziness, syncope, seizure activity       Objective:    BP 124/60 mmHg  Pulse 70  Temp(Src) 97.5 F (36.4 C) (Oral)  Resp 16  Ht 6' (1.829 m)  Wt 228 lb (103.42 kg)  BMI 30.92 kg/m2 GEN- NAD, alert and oriented x3 HEENT- PERRL, EOMI, non injected sclera, pink conjunctiva, MMM, oropharynx injected, no exduates,  Neck- Supple, + shotty anterior LAD CVS- RRR, no murmur RESP-CTAB Pulses- Radial - 2+       Assessment & Plan:      Problem List Items Addressed This Visit    None    Visit Diagnoses    Strep pharyngitis    -  Primary    Recurrent strep, ? if he is a carrier or some resistant strain, change to ominicef 300mg  BID, culture sent for confirmation, if no improvement send to ENT    Relevant Medications    cefdinir (OMNICEF) 300 MG capsule    Other Relevant Orders    Rapid Strep Screen (Completed)    Throat culture       Note:  This dictation was prepared with Dragon dictation along with smaller phrase technology. Any transcriptional errors that result from this process are unintentional.

## 2014-11-19 NOTE — Addendum Note (Signed)
Addended by: Haskel Khan A on: 11/19/2014 11:44 AM   Modules accepted: Orders

## 2014-11-19 NOTE — Patient Instructions (Signed)
We will call with throat culture Take new antibiotics as prescribed F/U as needed

## 2014-11-21 LAB — CULTURE, GROUP A STREP: ORGANISM ID, BACTERIA: NORMAL

## 2014-12-27 ENCOUNTER — Telehealth: Payer: Self-pay | Admitting: *Deleted

## 2014-12-27 ENCOUNTER — Other Ambulatory Visit: Payer: Self-pay | Admitting: *Deleted

## 2014-12-27 MED ORDER — EZETIMIBE 10 MG PO TABS: ORAL_TABLET | ORAL | Status: DC

## 2014-12-27 MED ORDER — ZOLPIDEM TARTRATE 10 MG PO TABS: 10.0000 mg | ORAL_TABLET | Freq: Every evening | ORAL | Status: DC | PRN

## 2014-12-27 NOTE — Telephone Encounter (Signed)
Medication called to pharmacy. 

## 2014-12-27 NOTE — Telephone Encounter (Signed)
Refill appropriate and filled per protocol. 

## 2014-12-27 NOTE — Telephone Encounter (Signed)
Received fax requesting refill on Ambien.   Ok to refill??  Last office visit 11/19/2014.  Last refill 10/03/2014, #2 refills.

## 2014-12-27 NOTE — Telephone Encounter (Signed)
ok 

## 2014-12-27 NOTE — Telephone Encounter (Signed)
Received fax requesting refill on Zetia.   Refill appropriate and filled per protocol. 

## 2015-01-25 ENCOUNTER — Other Ambulatory Visit: Payer: Self-pay | Admitting: *Deleted

## 2015-01-25 MED ORDER — PANTOPRAZOLE SODIUM 40 MG PO TBEC: 40.0000 mg | DELAYED_RELEASE_TABLET | Freq: Every day | ORAL | Status: DC

## 2015-01-25 NOTE — Telephone Encounter (Signed)
Received fax requesting refill on Protonix.   Refill appropriate and filled per protocol.

## 2015-01-31 DIAGNOSIS — Z9889 Other specified postprocedural states: Secondary | ICD-10-CM | POA: Diagnosis not present

## 2015-01-31 DIAGNOSIS — Z6832 Body mass index (BMI) 32.0-32.9, adult: Secondary | ICD-10-CM | POA: Diagnosis not present

## 2015-01-31 DIAGNOSIS — D497 Neoplasm of unspecified behavior of endocrine glands and other parts of nervous system: Secondary | ICD-10-CM | POA: Diagnosis not present

## 2015-02-06 DIAGNOSIS — Z9889 Other specified postprocedural states: Secondary | ICD-10-CM | POA: Diagnosis not present

## 2015-02-06 DIAGNOSIS — Z6833 Body mass index (BMI) 33.0-33.9, adult: Secondary | ICD-10-CM | POA: Diagnosis not present

## 2015-02-06 DIAGNOSIS — D497 Neoplasm of unspecified behavior of endocrine glands and other parts of nervous system: Secondary | ICD-10-CM | POA: Diagnosis not present

## 2015-03-19 DIAGNOSIS — Z23 Encounter for immunization: Secondary | ICD-10-CM | POA: Diagnosis not present

## 2015-03-26 ENCOUNTER — Other Ambulatory Visit: Payer: Self-pay | Admitting: Family Medicine

## 2015-03-26 NOTE — Telephone Encounter (Signed)
Ok to refill??  Last office visit 11/19/2014.  Last refill 12/27/2014, #2 refills.

## 2015-03-26 NOTE — Telephone Encounter (Signed)
Medication called to pharmacy. 

## 2015-03-26 NOTE — Telephone Encounter (Signed)
Okay to refill? 

## 2015-03-28 ENCOUNTER — Telehealth: Payer: Self-pay | Admitting: *Deleted

## 2015-03-28 NOTE — Telephone Encounter (Signed)
Received request from pharmacy for PA on Ambien.  PA submitted.   Dx: G47.00- insomnia 

## 2015-04-02 NOTE — Telephone Encounter (Signed)
Received PA determination.   PA approved 03/12/2015- 04/01/2016.  Case ID: [id number].  Pharmacy made aware.

## 2015-04-18 DIAGNOSIS — M5124 Other intervertebral disc displacement, thoracic region: Secondary | ICD-10-CM | POA: Diagnosis not present

## 2015-04-18 DIAGNOSIS — D497 Neoplasm of unspecified behavior of endocrine glands and other parts of nervous system: Secondary | ICD-10-CM | POA: Diagnosis not present

## 2015-04-25 ENCOUNTER — Other Ambulatory Visit: Payer: Self-pay | Admitting: *Deleted

## 2015-04-25 DIAGNOSIS — Z9889 Other specified postprocedural states: Secondary | ICD-10-CM | POA: Diagnosis not present

## 2015-04-25 DIAGNOSIS — D497 Neoplasm of unspecified behavior of endocrine glands and other parts of nervous system: Secondary | ICD-10-CM | POA: Diagnosis not present

## 2015-04-25 DIAGNOSIS — F40240 Claustrophobia: Secondary | ICD-10-CM | POA: Diagnosis not present

## 2015-04-25 MED ORDER — EZETIMIBE 10 MG PO TABS: ORAL_TABLET | ORAL | Status: DC

## 2015-04-25 NOTE — Telephone Encounter (Signed)
Received fax requesting refill on Zetia.   Refill appropriate and filled per protocol. 

## 2015-04-30 ENCOUNTER — Telehealth: Payer: Self-pay | Admitting: Family Medicine

## 2015-04-30 DIAGNOSIS — R918 Other nonspecific abnormal finding of lung field: Secondary | ICD-10-CM

## 2015-04-30 NOTE — Telephone Encounter (Signed)
Patient calling to speak with you regarding getting a second opinion regarding his back issues  [phone]

## 2015-05-01 NOTE — Telephone Encounter (Signed)
Call pt, and let him know I have ordered CT of chest because of the lung lesion seen and the heart enlargement. I will f/u with him after this has been done

## 2015-05-01 NOTE — Telephone Encounter (Signed)
Call placed to patient. LMTRC.  

## 2015-05-01 NOTE — Telephone Encounter (Signed)
Noted, will review imaging Pt was noted to have borderline cardiomegaly in 2014

## 2015-05-01 NOTE — Telephone Encounter (Signed)
Call placed to patient.   States that he has seen Dr. Patrice Paradise in regards to growth in spine. Reports that he has had MRI, and was advised that growth has not changed since first noting abnormality.   Reports that he would like a second opinion, and has set up an appointment in [place] with Dr. Julaine Hua, neurosurgeon.   Also states that he noted on MRI report that the impression stated he has an enlarged heart and a lung lesion. Patient is bringing a copy of the report for MD to review.   MD to be made aware.

## 2015-05-02 ENCOUNTER — Telehealth: Payer: Self-pay | Admitting: *Deleted

## 2015-05-02 NOTE — Telephone Encounter (Signed)
Patient returned call and made aware.

## 2015-05-02 NOTE — Telephone Encounter (Signed)
Call placed to patient. LMTRC.  

## 2015-05-02 NOTE — Telephone Encounter (Signed)
Received request from pharmacy for PA on Ambien.  PA submitted.   Dx: G47.00- insomnia 

## 2015-05-03 NOTE — Telephone Encounter (Signed)
Received PA determination.   PA approved 05/03/2015- 05/02/2018.  Pharmacy made aware.

## 2015-05-08 ENCOUNTER — Ambulatory Visit (INDEPENDENT_AMBULATORY_CARE_PROVIDER_SITE_OTHER): Payer: BC Managed Care – PPO | Admitting: Family Medicine

## 2015-05-08 ENCOUNTER — Encounter: Payer: Self-pay | Admitting: Family Medicine

## 2015-05-08 VITALS — BP 128/80 | HR 80 | Temp 99.1°F | Resp 18 | Wt 243.5 lb

## 2015-05-08 DIAGNOSIS — D497 Neoplasm of unspecified behavior of endocrine glands and other parts of nervous system: Secondary | ICD-10-CM

## 2015-05-08 NOTE — Patient Instructions (Signed)
We will call with chest CT report Referral to Neurosurgery  F/U May for Physical

## 2015-05-08 NOTE — Progress Notes (Signed)
Patient ID: Derek [name], male   DOB: 03-10-1961, 55 y.o.   MRN: [id number]   Subjective:    Patient ID: Derek [name], male    DOB: 06/21/1960, 55 y.o.   MRN: [id number]  Patient presents for OTHER   patient here to discuss referral. He was being followed by Dr. Feliciana Rossetti he's had multiple back surgeries and also neck surgery. As he was complaining of more thoracic back pain and MRI was done a few months ago which showed an incidental intradural enhancing lesion at T4-T5 he had a follow-up MRI with and without contrast that showed a tumor-like lesion suspicious for meningioma however he was also around the disc that was causing some pain. He was seen by a Dr. Radonna Ricker who advised to monitor for now, but he would like to be referred for second opinion to Dr.Shu. He truly does not want another back surgery    note the scan also showed an incidental lesion in his right lung CT scan is being performed tomorrow to reevaluate this. Was smoker in the past    Review Of Systems:  GEN- denies fatigue, fever, weight loss,weakness, recent illness HEENT- denies eye drainage, change in vision, nasal discharge, CVS- denies chest pain, palpitations RESP- denies SOB, cough, wheeze ABD- denies N/V, change in stools, abd pain GU- denies dysuria, hematuria, dribbling, incontinence MSK- + joint pain, muscle aches, injury Neuro- denies headache, dizziness, syncope, seizure activity       Objective:    BP 128/80 mmHg  Pulse 80  Temp(Src) 99.1 F (37.3 C) (Oral)  Resp 18  Wt 243 lb 8 oz (110.451 kg) GEN- NAD, alert and oriented x3 Neck- Supple, no LAD CVS- RRR, no murmur RESP-CTAB EXT- No edema         Assessment & Plan:      Problem List Items Addressed This Visit    None    Visit Diagnoses    Spinal cord tumor ([place])    -  Primary    Send for 2nd opinion, based on MRI, ? Meningioma at T4-T5, not symptomatc at this time    Relevant Orders    Ambulatory referral to Neurosurgery       Note: This dictation was prepared with Dragon dictation along with smaller phrase technology. Any transcriptional errors that result from this process are unintentional.

## 2015-05-09 ENCOUNTER — Ambulatory Visit (HOSPITAL_COMMUNITY)
Admission: RE | Admit: 2015-05-09 | Discharge: 2015-05-09 | Disposition: A | Discharge status: Home / Self Care | Payer: BC Managed Care – PPO | Source: Ambulatory Visit | Attending: Family Medicine | Admitting: Family Medicine

## 2015-05-09 DIAGNOSIS — E041 Nontoxic single thyroid nodule: Secondary | ICD-10-CM | POA: Diagnosis not present

## 2015-05-09 DIAGNOSIS — I2510 Atherosclerotic heart disease of native coronary artery without angina pectoris: Secondary | ICD-10-CM | POA: Insufficient documentation

## 2015-05-09 DIAGNOSIS — E042 Nontoxic multinodular goiter: Secondary | ICD-10-CM | POA: Diagnosis not present

## 2015-05-09 DIAGNOSIS — R918 Other nonspecific abnormal finding of lung field: Secondary | ICD-10-CM

## 2015-05-09 DIAGNOSIS — J984 Other disorders of lung: Secondary | ICD-10-CM | POA: Diagnosis not present

## 2015-05-09 MED ORDER — IOHEXOL 300 MG/ML  SOLN
75.0000 mL | Freq: Once | INTRAMUSCULAR | Status: AC | PRN
  Administered 2015-05-09: 75 mL via INTRAVENOUS

## 2015-05-10 ENCOUNTER — Other Ambulatory Visit: Payer: Self-pay | Admitting: *Deleted

## 2015-05-10 DIAGNOSIS — E041 Nontoxic single thyroid nodule: Secondary | ICD-10-CM

## 2015-05-10 MED ORDER — ATORVASTATIN CALCIUM 10 MG PO TABS: 10.0000 mg | ORAL_TABLET | Freq: Every day | ORAL | Status: DC

## 2015-05-16 ENCOUNTER — Ambulatory Visit (HOSPITAL_COMMUNITY)
Admission: RE | Admit: 2015-05-16 | Discharge: 2015-05-16 | Disposition: A | Discharge status: Home / Self Care | Payer: BC Managed Care – PPO | Source: Ambulatory Visit | Attending: Family Medicine | Admitting: Family Medicine

## 2015-05-16 DIAGNOSIS — E041 Nontoxic single thyroid nodule: Secondary | ICD-10-CM

## 2015-05-16 DIAGNOSIS — E042 Nontoxic multinodular goiter: Secondary | ICD-10-CM | POA: Diagnosis not present

## 2015-05-17 DIAGNOSIS — E041 Nontoxic single thyroid nodule: Secondary | ICD-10-CM | POA: Insufficient documentation

## 2015-05-20 ENCOUNTER — Other Ambulatory Visit: Payer: Medicare Other

## 2015-05-20 DIAGNOSIS — E785 Hyperlipidemia, unspecified: Secondary | ICD-10-CM

## 2015-05-20 DIAGNOSIS — N400 Enlarged prostate without lower urinary tract symptoms: Secondary | ICD-10-CM | POA: Diagnosis not present

## 2015-05-20 DIAGNOSIS — I10 Essential (primary) hypertension: Secondary | ICD-10-CM

## 2015-05-20 DIAGNOSIS — Z79899 Other long term (current) drug therapy: Secondary | ICD-10-CM | POA: Diagnosis not present

## 2015-05-20 LAB — COMPLETE METABOLIC PANEL WITH GFR
ALT: 37 U/L (ref 9–46)
AST: 30 U/L (ref 10–35)
Albumin: 4.4 g/dL (ref 3.6–5.1)
Alkaline Phosphatase: 46 U/L (ref 40–115)
BUN: 15 mg/dL (ref 7–25)
CALCIUM: 9.6 mg/dL (ref 8.6–10.3)
CHLORIDE: 103 mmol/L (ref 98–110)
CO2: 29 mmol/L (ref 20–31)
CREATININE: 1.08 mg/dL (ref 0.70–1.33)
GFR, Est African American: 88 mL/min (ref >=60)
GFR, Est Non African American: 76 mL/min (ref >=60)
GLUCOSE: 87 mg/dL (ref 70–99)
Potassium: 4.5 mmol/L (ref 3.5–5.3)
SODIUM: 141 mmol/L (ref 135–146)
Total Bilirubin: 0.7 mg/dL (ref 0.2–1.2)
Total Protein: 6.5 g/dL (ref 6.1–8.1)

## 2015-05-20 LAB — CBC WITH DIFFERENTIAL/PLATELET
BASOS ABS: 0 10*3/uL (ref 0.0–0.1)
BASOS PCT: 1 % (ref 0–1)
EOS ABS: 0.2 10*3/uL (ref 0.0–0.7)
Eosinophils Relative: 4 % (ref 0–5)
HCT: 45.7 % (ref 39.0–52.0)
Hemoglobin: 15.1 g/dL (ref 13.0–17.0)
Lymphocytes Relative: 40 % (ref 12–46)
Lymphs Abs: 1.8 10*3/uL (ref 0.7–4.0)
MCH: 30 pg (ref 26.0–34.0)
MCHC: 33 g/dL (ref 30.0–36.0)
MCV: 90.7 fL (ref 78.0–100.0)
MPV: 10.1 fL (ref 8.6–12.4)
Monocytes Absolute: 0.6 10*3/uL (ref 0.1–1.0)
Monocytes Relative: 13 % — ABNORMAL HIGH (ref 3–12)
NEUTROS PCT: 42 % — AB (ref 43–77)
Neutro Abs: 1.8 10*3/uL (ref 1.7–7.7)
PLATELETS: 229 10*3/uL (ref 150–400)
RBC: 5.04 MIL/uL (ref 4.22–5.81)
RDW: 13.6 % (ref 11.5–15.5)
WBC: 4.4 10*3/uL (ref 4.0–10.5)

## 2015-05-20 LAB — LIPID PANEL
Cholesterol: 137 mg/dL (ref 125–200)
HDL: 32 mg/dL — ABNORMAL LOW (ref >=40)
LDL CALC: 84 mg/dL (ref <130)
Total CHOL/HDL Ratio: 4.3 Ratio (ref <=5.0)
Triglycerides: 105 mg/dL (ref <150)
VLDL: 21 mg/dL (ref <30)

## 2015-05-21 LAB — PSA: PSA: 0.28 ng/mL (ref <=4.00)

## 2015-06-04 DIAGNOSIS — D492 Neoplasm of unspecified behavior of bone, soft tissue, and skin: Secondary | ICD-10-CM | POA: Diagnosis not present

## 2015-06-04 DIAGNOSIS — F40240 Claustrophobia: Secondary | ICD-10-CM | POA: Insufficient documentation

## 2015-06-04 DIAGNOSIS — K5730 Diverticulosis of large intestine without perforation or abscess without bleeding: Secondary | ICD-10-CM | POA: Insufficient documentation

## 2015-06-25 ENCOUNTER — Telehealth: Payer: Self-pay | Admitting: *Deleted

## 2015-06-25 ENCOUNTER — Other Ambulatory Visit: Payer: Self-pay | Admitting: *Deleted

## 2015-06-25 MED ORDER — PANTOPRAZOLE SODIUM 40 MG PO TBEC: 40.0000 mg | DELAYED_RELEASE_TABLET | Freq: Two times a day (BID) | ORAL | Status: DC

## 2015-06-25 MED ORDER — ZOLPIDEM TARTRATE 10 MG PO TABS: ORAL_TABLET | ORAL | Status: DC

## 2015-06-25 NOTE — Telephone Encounter (Signed)
Okay to refill? 

## 2015-06-25 NOTE — Telephone Encounter (Signed)
Received fax requesting refill on Pantoprazole.   Refill appropriate and filled per protocol. 

## 2015-06-25 NOTE — Telephone Encounter (Signed)
Medication called to pharmacy. 

## 2015-06-25 NOTE — Telephone Encounter (Signed)
Received fax requesting refill on Ambien.   Ok to refill??  Last office visit 05/08/2015.  Last refill 03/26/2015, #2 refills.

## 2015-07-18 ENCOUNTER — Telehealth: Payer: Self-pay | Admitting: Family Medicine

## 2015-07-18 NOTE — Telephone Encounter (Signed)
Patient would like refill on his protonix, pharmacy told him to call us  [facility]   [phone] (H)

## 2015-07-24 ENCOUNTER — Ambulatory Visit (INDEPENDENT_AMBULATORY_CARE_PROVIDER_SITE_OTHER): Payer: BC Managed Care – PPO | Admitting: Family Medicine

## 2015-07-24 ENCOUNTER — Encounter: Payer: Self-pay | Admitting: Family Medicine

## 2015-07-24 VITALS — BP 134/80 | HR 82 | Temp 98.4°F | Resp 16 | Ht 72.0 in | Wt 239.0 lb

## 2015-07-24 DIAGNOSIS — T148 Other injury of unspecified body region: Secondary | ICD-10-CM | POA: Diagnosis not present

## 2015-07-24 DIAGNOSIS — W57XXXA Bitten or stung by nonvenomous insect and other nonvenomous arthropods, initial encounter: Secondary | ICD-10-CM | POA: Diagnosis not present

## 2015-07-24 MED ORDER — DOXYCYCLINE HYCLATE 100 MG PO TABS: 100.0000 mg | ORAL_TABLET | Freq: Two times a day (BID) | ORAL | Status: DC

## 2015-07-24 NOTE — Patient Instructions (Signed)
Take antibiotics as prescribed Cortisone 1% is okay F/U as needed

## 2015-07-24 NOTE — Progress Notes (Signed)
Patient ID: Derek [name], male   DOB: 21-Nov-1960, 55 y.o.   MRN: [id number]   Subjective:    Patient ID: Derek [name], male    DOB: [birth year], 55 y.o.   MRN: [id number]  Patient presents for Tick Bite  Patient here with tick bite to his medial aspect of his right thigh. He pulled off the tick on Sunday. He's had some redness and itching since then. He has not had any fever no chills. No new joint pain.He is not sure if he removed the entire tickets it was very small.    Review Of Systems:  GEN- denies fatigue, fever, weight loss,weakness, recent illness HEENT- denies eye drainage, change in vision, nasal discharge, CVS- denies chest pain, palpitations RESP- denies SOB, cough, wheeze ABD- denies N/V, change in stools, abd pain GU- denies dysuria, hematuria, dribbling, incontinence MSK- denies joint pain, muscle aches, injury Neuro- denies headache, dizziness, syncope, seizure activity       Objective:    BP 134/80 mmHg  Pulse 82  Temp(Src) 98.4 F (36.9 C) (Oral)  Resp 16  Ht 6' (1.829 m)  Wt 239 lb (108.41 kg)  BMI 32.41 kg/m2 GEN- NAD, alert and oriented x3 HEENT- PERRL, EOMI, non injected sclera, pink conjunctiva, MMM, oropharynx clear Skin- Right thigh- bite with dark liner speck at center, surounding  1 inch halo of erythema, mild induration, NT  Ext- no edema, no knee effusion          Assessment & Plan:      Problem List Items Addressed This Visit    None    Visit Diagnoses    Tick bite    -  Primary    Forceps used to remove black foreign body, ? piece of tick, based on apperance in short time, will cover for Lyme with doxycycline 100mg  BID, discussed sun exposure Okay to use cortisone for itching       Note: This dictation was prepared with Dragon dictation along with smaller phrase technology. Any transcriptional errors that result from this process are unintentional.

## 2015-08-26 ENCOUNTER — Other Ambulatory Visit: Payer: Self-pay | Admitting: *Deleted

## 2015-08-26 MED ORDER — ZETIA 10 MG PO TABS: 10.0000 mg | ORAL_TABLET | Freq: Every day | ORAL | Status: DC

## 2015-08-26 NOTE — Telephone Encounter (Signed)
Received fax requesting refill on Zetia.   Refill appropriate and filled per protocol. 

## 2015-09-25 ENCOUNTER — Telehealth: Payer: Self-pay | Admitting: *Deleted

## 2015-09-25 MED ORDER — ZOLPIDEM TARTRATE 10 MG PO TABS: ORAL_TABLET | ORAL | Status: DC

## 2015-09-25 NOTE — Telephone Encounter (Signed)
Okay to refill? 

## 2015-09-25 NOTE — Telephone Encounter (Signed)
Received fax requesting refill on Ambien.   Ok to refill??  Last office visit 07/24/2015.  Last refill 06/25/2015, #2 refills.

## 2015-09-25 NOTE — Telephone Encounter (Signed)
Medication called to pharmacy. 

## 2015-11-01 ENCOUNTER — Other Ambulatory Visit: Payer: Self-pay | Admitting: *Deleted

## 2015-11-01 MED ORDER — PANTOPRAZOLE SODIUM 40 MG PO TBEC: 40.0000 mg | DELAYED_RELEASE_TABLET | Freq: Two times a day (BID) | ORAL | 3 refills | Status: DC

## 2015-11-01 NOTE — Telephone Encounter (Signed)
Received fax requesting refill on Protonix.   Refill appropriate and filled per protocol. 

## 2015-11-28 ENCOUNTER — Telehealth: Payer: Self-pay | Admitting: Family Medicine

## 2015-11-28 DIAGNOSIS — Z23 Encounter for immunization: Secondary | ICD-10-CM | POA: Diagnosis not present

## 2015-11-28 MED ORDER — EZETIMIBE 10 MG PO TABS: 10.0000 mg | ORAL_TABLET | Freq: Every day | ORAL | 3 refills | Status: DC

## 2015-11-28 NOTE — Telephone Encounter (Signed)
Prescription sent to pharmacy for generic. 

## 2015-11-28 NOTE — Telephone Encounter (Signed)
Mr. Funt called saying he was told by his pharmacy that the Rx of Zetia would cost $70. He was also informed there's a generic brand of the medication that would be cheaper. He's wondering if he can receive the generic brand of the medication. Please give him a phone call regarding this if needed.  Pt's ph# [phone] Thank you.

## 2015-12-02 DIAGNOSIS — D492 Neoplasm of unspecified behavior of bone, soft tissue, and skin: Secondary | ICD-10-CM | POA: Diagnosis not present

## 2015-12-05 DIAGNOSIS — D492 Neoplasm of unspecified behavior of bone, soft tissue, and skin: Secondary | ICD-10-CM | POA: Insufficient documentation

## 2015-12-21 ENCOUNTER — Other Ambulatory Visit: Payer: Self-pay | Admitting: Family Medicine

## 2015-12-23 NOTE — Telephone Encounter (Signed)
Okay to refill? 

## 2015-12-23 NOTE — Telephone Encounter (Signed)
(  AMBIEN) 10 MG tablet take 1 tablet at bedtime if needed Patient is requesting refill is it ok?

## 2016-01-09 ENCOUNTER — Other Ambulatory Visit: Payer: Self-pay | Admitting: Gastroenterology

## 2016-01-15 ENCOUNTER — Ambulatory Visit (INDEPENDENT_AMBULATORY_CARE_PROVIDER_SITE_OTHER): Payer: Medicare Other | Admitting: Family Medicine

## 2016-01-15 ENCOUNTER — Encounter: Payer: Self-pay | Admitting: Family Medicine

## 2016-01-15 VITALS — BP 138/84 | HR 78 | Temp 99.0°F | Resp 12 | Ht 72.0 in | Wt 221.0 lb

## 2016-01-15 DIAGNOSIS — K529 Noninfective gastroenteritis and colitis, unspecified: Secondary | ICD-10-CM

## 2016-01-15 LAB — COMPREHENSIVE METABOLIC PANEL
ALT: 35 U/L (ref 9–46)
AST: 27 U/L (ref 10–35)
Albumin: 4.4 g/dL (ref 3.6–5.1)
Alkaline Phosphatase: 51 U/L (ref 40–115)
BILIRUBIN TOTAL: 0.7 mg/dL (ref 0.2–1.2)
BUN: 15 mg/dL (ref 7–25)
CALCIUM: 9.7 mg/dL (ref 8.6–10.3)
CO2: 26 mmol/L (ref 20–31)
Chloride: 98 mmol/L (ref 98–110)
Creat: 1.03 mg/dL (ref 0.70–1.33)
GLUCOSE: 73 mg/dL (ref 70–99)
Potassium: 4.6 mmol/L (ref 3.5–5.3)
Sodium: 139 mmol/L (ref 135–146)
Total Protein: 6.8 g/dL (ref 6.1–8.1)

## 2016-01-15 LAB — CBC WITH DIFFERENTIAL/PLATELET
Basophils Absolute: 0 cells/uL (ref 0–200)
Basophils Relative: 0 %
Eosinophils Absolute: 142 cells/uL (ref 15–500)
Eosinophils Relative: 2 %
HEMATOCRIT: 46.1 % (ref 38.5–50.0)
Hemoglobin: 15.8 g/dL (ref 13.0–17.0)
LYMPHS PCT: 31 %
Lymphs Abs: 2201 cells/uL (ref 850–3900)
MCH: 31 pg (ref 27.0–33.0)
MCHC: 34.3 g/dL (ref 32.0–36.0)
MCV: 90.6 fL (ref 80.0–100.0)
MONO ABS: 710 {cells}/uL (ref 200–950)
MONOS PCT: 10 %
MPV: 9.9 fL (ref 7.5–12.5)
NEUTROS PCT: 57 %
Neutro Abs: 4047 cells/uL (ref 1500–7800)
PLATELETS: 249 10*3/uL (ref 140–400)
RBC: 5.09 MIL/uL (ref 4.20–5.80)
RDW: 13.1 % (ref 11.0–15.0)
WBC: 7.1 10*3/uL (ref 3.8–10.8)

## 2016-01-15 NOTE — Patient Instructions (Signed)
Plenty of fluids Bland diet No more immodium

## 2016-01-15 NOTE — Progress Notes (Signed)
   Subjective:    Patient ID: Derek [name], male    DOB: Mar 14, 1961, 55 y.o.   MRN: [id number]  Patient presents for GI Upset (x2 days- abd pain, diarrhea- states that it looks like pink tinged mucus)  Here with abdominal pain and diarrhea for the past 2 days. States on Sunday he was in his normal state which is mostly constipation he finally was able to have a bowel movement that [place]. Monday morning he began having a mucousy diarrhea he would have bowel movements every 15-30 minutes. There was some blood-tinged but he does have internal hemorrhoids. His abdomen is not then became sore all over. He is not having nausea vomiting no fever. He took Imodium Tuesday and his episodes improved. He's had 3 bowel movements today now more formed stool and he has not noted any blood tinged mucus. His soreness in his abdomen has improved. His appetite however is still decreased but he has been trying to drink regularly note that he is scheduled to have his back surgery next week and he has been nervous and anxious about this as well   Review Of Systems:  GEN- denies fatigue, fever, weight loss,weakness, recent illness HEENT- denies eye drainage, change in vision, nasal discharge, CVS- denies chest pain, palpitations RESP- denies SOB, cough, wheeze ABD- denies N/V, +change in stools,+ abd pain GU- denies dysuria, hematuria, dribbling, incontinence MSK- denies joint pain, muscle aches, injury Neuro- denies headache, dizziness, syncope, seizure activity       Objective:    BP 138/84 (BP Location: Left Arm, Patient Position: Sitting, Cuff Size: Large)   Pulse 78   Temp 99 F (37.2 C) (Oral)   Resp 12   Ht 6' (1.829 m)   Wt 221 lb (100.2 kg)   BMI 29.97 kg/m  GEN- NAD, alert and oriented x3 HEENT- PERRL, EOMI, non injected sclera, pink conjunctiva, MM a little dry  oropharynx clear CVS- RRR, no  murmur RESP-CTAB ABD-NABS,soft,mild TTP lower quadrants, no rebound, no guarding EXT- No edema Pulses- Radial  2+        Assessment & Plan:      Problem List Items Addressed This Visit    None    Visit Diagnoses    Gastroenteritis    -  Primary   supportive care, bloody mucus he has internal hemorroids but that is now resolved. Check his lytes going into surgery, up fluids. stools improving   Relevant Orders   CBC with Differential/Platelet   Comprehensive metabolic panel      Note: This dictation was prepared with Dragon dictation along with smaller phrase technology. Any transcriptional errors that result from this process are unintentional.

## 2016-01-16 ENCOUNTER — Encounter: Payer: Self-pay | Admitting: *Deleted

## 2016-01-21 DIAGNOSIS — G4733 Obstructive sleep apnea (adult) (pediatric): Secondary | ICD-10-CM | POA: Diagnosis present

## 2016-01-21 DIAGNOSIS — D497 Neoplasm of unspecified behavior of endocrine glands and other parts of nervous system: Secondary | ICD-10-CM | POA: Diagnosis not present

## 2016-01-21 DIAGNOSIS — M4804 Spinal stenosis, thoracic region: Secondary | ICD-10-CM | POA: Diagnosis not present

## 2016-01-21 DIAGNOSIS — Z981 Arthrodesis status: Secondary | ICD-10-CM | POA: Diagnosis not present

## 2016-01-21 DIAGNOSIS — D321 Benign neoplasm of spinal meninges: Secondary | ICD-10-CM | POA: Diagnosis not present

## 2016-01-21 DIAGNOSIS — M4324 Fusion of spine, thoracic region: Secondary | ICD-10-CM | POA: Diagnosis not present

## 2016-01-21 DIAGNOSIS — M5124 Other intervertebral disc displacement, thoracic region: Secondary | ICD-10-CM | POA: Diagnosis not present

## 2016-01-21 DIAGNOSIS — K219 Gastro-esophageal reflux disease without esophagitis: Secondary | ICD-10-CM | POA: Diagnosis present

## 2016-02-04 DIAGNOSIS — Z9889 Other specified postprocedural states: Secondary | ICD-10-CM | POA: Insufficient documentation

## 2016-02-05 ENCOUNTER — Ambulatory Visit (HOSPITAL_COMMUNITY): Payer: BC Managed Care – PPO | Attending: Family Medicine

## 2016-02-05 DIAGNOSIS — M6281 Muscle weakness (generalized): Secondary | ICD-10-CM | POA: Insufficient documentation

## 2016-02-05 DIAGNOSIS — R262 Difficulty in walking, not elsewhere classified: Secondary | ICD-10-CM | POA: Diagnosis not present

## 2016-02-05 DIAGNOSIS — R2681 Unsteadiness on feet: Secondary | ICD-10-CM | POA: Diagnosis not present

## 2016-02-05 DIAGNOSIS — M546 Pain in thoracic spine: Secondary | ICD-10-CM | POA: Insufficient documentation
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
## 2016-02-06 ENCOUNTER — Ambulatory Visit (HOSPITAL_COMMUNITY): Payer: BC Managed Care – PPO | Admitting: Physical Therapy

## 2016-02-06 DIAGNOSIS — M546 Pain in thoracic spine: Secondary | ICD-10-CM

## 2016-02-06 DIAGNOSIS — R262 Difficulty in walking, not elsewhere classified: Secondary | ICD-10-CM | POA: Diagnosis not present

## 2016-02-06 DIAGNOSIS — M6281 Muscle weakness (generalized): Secondary | ICD-10-CM | POA: Diagnosis not present

## 2016-02-06 DIAGNOSIS — R2681 Unsteadiness on feet: Secondary | ICD-10-CM | POA: Diagnosis not present
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
## 2016-02-11 ENCOUNTER — Ambulatory Visit (HOSPITAL_COMMUNITY): Payer: BC Managed Care – PPO

## 2016-02-11 ENCOUNTER — Ambulatory Visit: Payer: Medicare Other | Admitting: Family Medicine

## 2016-02-11 DIAGNOSIS — M546 Pain in thoracic spine: Secondary | ICD-10-CM

## 2016-02-11 DIAGNOSIS — R2681 Unsteadiness on feet: Secondary | ICD-10-CM | POA: Diagnosis not present

## 2016-02-11 DIAGNOSIS — R262 Difficulty in walking, not elsewhere classified: Secondary | ICD-10-CM | POA: Diagnosis not present

## 2016-02-11 DIAGNOSIS — M6281 Muscle weakness (generalized): Secondary | ICD-10-CM | POA: Diagnosis not present
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
## 2016-02-12 ENCOUNTER — Encounter (HOSPITAL_COMMUNITY): Payer: Self-pay | Admitting: Physical Therapy

## 2016-02-13 ENCOUNTER — Ambulatory Visit (HOSPITAL_COMMUNITY): Payer: BC Managed Care – PPO | Admitting: Physical Therapy

## 2016-02-13 DIAGNOSIS — M546 Pain in thoracic spine: Secondary | ICD-10-CM

## 2016-02-13 DIAGNOSIS — M6281 Muscle weakness (generalized): Secondary | ICD-10-CM

## 2016-02-13 DIAGNOSIS — R2681 Unsteadiness on feet: Secondary | ICD-10-CM | POA: Diagnosis not present

## 2016-02-13 DIAGNOSIS — R262 Difficulty in walking, not elsewhere classified: Secondary | ICD-10-CM
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
## 2016-02-18 ENCOUNTER — Ambulatory Visit (HOSPITAL_COMMUNITY): Payer: BC Managed Care – PPO

## 2016-02-18 DIAGNOSIS — R262 Difficulty in walking, not elsewhere classified: Secondary | ICD-10-CM

## 2016-02-18 DIAGNOSIS — M546 Pain in thoracic spine: Secondary | ICD-10-CM | POA: Diagnosis not present

## 2016-02-18 DIAGNOSIS — R2681 Unsteadiness on feet: Secondary | ICD-10-CM

## 2016-02-18 DIAGNOSIS — M6281 Muscle weakness (generalized): Secondary | ICD-10-CM
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
## 2016-02-18 NOTE — Patient Instructions (Signed)

## 2016-02-21 ENCOUNTER — Ambulatory Visit (HOSPITAL_COMMUNITY): Payer: BC Managed Care – PPO | Admitting: Physical Therapy

## 2016-02-21 ENCOUNTER — Telehealth (HOSPITAL_COMMUNITY): Payer: Self-pay | Admitting: Family Medicine

## 2016-02-21 NOTE — Telephone Encounter (Signed)
02/21/16  Pt cx for a week because he said that since he was last here he has been in a lot of pain and the dr advised to stop therapy for 1 week.  He will call us back to let us know if he plans to resume or has to cx therapy

## 2016-02-24 ENCOUNTER — Ambulatory Visit (HOSPITAL_COMMUNITY): Payer: BC Managed Care – PPO

## 2016-02-24 ENCOUNTER — Telehealth (HOSPITAL_COMMUNITY): Payer: Self-pay | Admitting: Family Medicine

## 2016-02-24 ENCOUNTER — Telehealth (HOSPITAL_COMMUNITY): Payer: Self-pay

## 2016-02-24 NOTE — Telephone Encounter (Signed)
02/24/16 he had called and left [place] a message to call him.   I called him back and had to leave a message

## 2016-02-24 NOTE — Telephone Encounter (Signed)
Pt called back to confirm that apptment for tomorrow has been cx'ed and he will plan to be here on Monday 03/02/16. NF  1:14 02/24/16

## 2016-02-26 ENCOUNTER — Ambulatory Visit (HOSPITAL_COMMUNITY): Payer: BC Managed Care – PPO | Admitting: Physical Therapy

## 2016-03-02 ENCOUNTER — Telehealth (HOSPITAL_COMMUNITY): Payer: Self-pay | Admitting: Family Medicine

## 2016-03-02 ENCOUNTER — Ambulatory Visit (HOSPITAL_COMMUNITY): Payer: BC Managed Care – PPO

## 2016-03-02 ENCOUNTER — Telehealth (HOSPITAL_COMMUNITY): Payer: Self-pay

## 2016-03-02 NOTE — Telephone Encounter (Signed)
03/02/16  Pt left a message to cx his appt today at 1:45 and when I looked Izora Gala had cx the appt but the patient also asked for a call back.... I called but had to leave a message

## 2016-03-02 NOTE — Telephone Encounter (Signed)
Patient states he is in a lot of pain and will see the MD on 03/09/16, then he will call us back to reshedule more appointments. NF 03/02/16

## 2016-03-05 ENCOUNTER — Ambulatory Visit (HOSPITAL_COMMUNITY): Payer: BC Managed Care – PPO

## 2016-03-10 ENCOUNTER — Telehealth (HOSPITAL_COMMUNITY): Payer: Self-pay

## 2016-03-10 NOTE — Telephone Encounter (Signed)
Pt called to reschedule states his MD saw him yesterday and approved him to continue treatment. NF 03/10/16

## 2016-03-11 ENCOUNTER — Ambulatory Visit (HOSPITAL_COMMUNITY): Payer: BC Managed Care – PPO | Attending: Family Medicine

## 2016-03-11 DIAGNOSIS — R262 Difficulty in walking, not elsewhere classified: Secondary | ICD-10-CM | POA: Insufficient documentation

## 2016-03-11 DIAGNOSIS — H61002 Unspecified perichondritis of left external ear: Secondary | ICD-10-CM | POA: Diagnosis not present

## 2016-03-11 DIAGNOSIS — M546 Pain in thoracic spine: Secondary | ICD-10-CM

## 2016-03-11 DIAGNOSIS — M6281 Muscle weakness (generalized): Secondary | ICD-10-CM | POA: Insufficient documentation

## 2016-03-11 DIAGNOSIS — R2681 Unsteadiness on feet: Secondary | ICD-10-CM | POA: Insufficient documentation
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
## 2016-03-12 ENCOUNTER — Telehealth (HOSPITAL_COMMUNITY): Payer: Self-pay

## 2016-03-12 NOTE — Telephone Encounter (Signed)
PT contacted office of spine surgeon to get updates on any lumbar spine precautions. Melissa reports that he has no active precautions at this time, and that they yield to precautions from Dr. Rigoberto Noel.   4:23 PM, 03/12/16 Etta Grandchild, PT, DPT Physical Therapist at [place] [phone] (office)

## 2016-03-12 NOTE — Telephone Encounter (Addendum)
PT contacted Dr. Isabell Jarvis office for updates regarding postoperative restrictions: pt is able to now gradually return to PLOF without precautions per Lawerance Bach, Dr. Isabell Jarvis nurse practitioner.   4:24 PM, 03/12/16 Etta Grandchild, PT, DPT Physical Therapist at [place] [phone] (office)

## 2016-03-17 ENCOUNTER — Ambulatory Visit (HOSPITAL_COMMUNITY): Payer: BC Managed Care – PPO

## 2016-03-18 ENCOUNTER — Ambulatory Visit (HOSPITAL_COMMUNITY): Payer: BC Managed Care – PPO

## 2016-03-18 DIAGNOSIS — M6281 Muscle weakness (generalized): Secondary | ICD-10-CM | POA: Diagnosis not present

## 2016-03-18 DIAGNOSIS — M546 Pain in thoracic spine: Secondary | ICD-10-CM

## 2016-03-18 DIAGNOSIS — R2681 Unsteadiness on feet: Secondary | ICD-10-CM

## 2016-03-18 DIAGNOSIS — R262 Difficulty in walking, not elsewhere classified: Secondary | ICD-10-CM
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
## 2016-03-20 ENCOUNTER — Ambulatory Visit (HOSPITAL_COMMUNITY): Payer: BC Managed Care – PPO

## 2016-03-20 DIAGNOSIS — R262 Difficulty in walking, not elsewhere classified: Secondary | ICD-10-CM

## 2016-03-20 DIAGNOSIS — R2681 Unsteadiness on feet: Secondary | ICD-10-CM | POA: Diagnosis not present

## 2016-03-20 DIAGNOSIS — M6281 Muscle weakness (generalized): Secondary | ICD-10-CM

## 2016-03-20 DIAGNOSIS — M546 Pain in thoracic spine: Secondary | ICD-10-CM
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
## 2016-03-23 ENCOUNTER — Other Ambulatory Visit: Payer: Self-pay | Admitting: Family Medicine

## 2016-03-23 NOTE — Telephone Encounter (Signed)
Ok to refill??  Last office visit 01/15/2016.  Last refill 12/23/2015, #2 refills.

## 2016-03-23 NOTE — Telephone Encounter (Signed)
okay

## 2016-03-24 ENCOUNTER — Ambulatory Visit (HOSPITAL_COMMUNITY): Payer: BC Managed Care – PPO

## 2016-03-24 DIAGNOSIS — R2681 Unsteadiness on feet: Secondary | ICD-10-CM | POA: Diagnosis not present

## 2016-03-24 DIAGNOSIS — R262 Difficulty in walking, not elsewhere classified: Secondary | ICD-10-CM

## 2016-03-24 DIAGNOSIS — M546 Pain in thoracic spine: Secondary | ICD-10-CM

## 2016-03-24 DIAGNOSIS — M6281 Muscle weakness (generalized): Secondary | ICD-10-CM | POA: Diagnosis not present
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
## 2016-03-26 ENCOUNTER — Ambulatory Visit (HOSPITAL_COMMUNITY): Payer: BC Managed Care – PPO | Admitting: Physical Therapy

## 2016-03-26 DIAGNOSIS — M546 Pain in thoracic spine: Secondary | ICD-10-CM

## 2016-03-26 DIAGNOSIS — R262 Difficulty in walking, not elsewhere classified: Secondary | ICD-10-CM

## 2016-03-26 DIAGNOSIS — R2681 Unsteadiness on feet: Secondary | ICD-10-CM

## 2016-03-26 DIAGNOSIS — M6281 Muscle weakness (generalized): Secondary | ICD-10-CM
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
## 2016-04-02 ENCOUNTER — Ambulatory Visit (HOSPITAL_COMMUNITY): Payer: BC Managed Care – PPO

## 2016-04-19 ENCOUNTER — Other Ambulatory Visit: Payer: Self-pay | Admitting: Family Medicine

## 2016-04-28 DIAGNOSIS — D321 Benign neoplasm of spinal meninges: Secondary | ICD-10-CM | POA: Diagnosis not present

## 2016-04-28 DIAGNOSIS — F17290 Nicotine dependence, other tobacco product, uncomplicated: Secondary | ICD-10-CM | POA: Diagnosis not present

## 2016-04-28 DIAGNOSIS — Z9889 Other specified postprocedural states: Secondary | ICD-10-CM | POA: Diagnosis not present

## 2016-04-28 DIAGNOSIS — M546 Pain in thoracic spine: Secondary | ICD-10-CM | POA: Diagnosis not present

## 2016-04-28 DIAGNOSIS — Z981 Arthrodesis status: Secondary | ICD-10-CM | POA: Diagnosis not present

## 2016-04-29 DIAGNOSIS — M546 Pain in thoracic spine: Secondary | ICD-10-CM | POA: Diagnosis not present

## 2016-04-29 DIAGNOSIS — M2560 Stiffness of unspecified joint, not elsewhere classified: Secondary | ICD-10-CM | POA: Diagnosis not present

## 2016-04-29 DIAGNOSIS — M6249 Contracture of muscle, multiple sites: Secondary | ICD-10-CM | POA: Diagnosis not present

## 2016-04-29 DIAGNOSIS — M6250 Muscle wasting and atrophy, not elsewhere classified, unspecified site: Secondary | ICD-10-CM | POA: Diagnosis not present

## 2016-04-30 DIAGNOSIS — M6250 Muscle wasting and atrophy, not elsewhere classified, unspecified site: Secondary | ICD-10-CM | POA: Diagnosis not present

## 2016-04-30 DIAGNOSIS — M6249 Contracture of muscle, multiple sites: Secondary | ICD-10-CM | POA: Diagnosis not present

## 2016-04-30 DIAGNOSIS — M546 Pain in thoracic spine: Secondary | ICD-10-CM | POA: Diagnosis not present

## 2016-04-30 DIAGNOSIS — M2560 Stiffness of unspecified joint, not elsewhere classified: Secondary | ICD-10-CM | POA: Diagnosis not present

## 2016-05-13 DIAGNOSIS — M6250 Muscle wasting and atrophy, not elsewhere classified, unspecified site: Secondary | ICD-10-CM | POA: Diagnosis not present

## 2016-05-13 DIAGNOSIS — M546 Pain in thoracic spine: Secondary | ICD-10-CM | POA: Diagnosis not present

## 2016-05-13 DIAGNOSIS — M2560 Stiffness of unspecified joint, not elsewhere classified: Secondary | ICD-10-CM | POA: Diagnosis not present

## 2016-05-13 DIAGNOSIS — M6249 Contracture of muscle, multiple sites: Secondary | ICD-10-CM | POA: Diagnosis not present

## 2016-05-15 DIAGNOSIS — M546 Pain in thoracic spine: Secondary | ICD-10-CM | POA: Diagnosis not present

## 2016-05-15 DIAGNOSIS — M6250 Muscle wasting and atrophy, not elsewhere classified, unspecified site: Secondary | ICD-10-CM | POA: Diagnosis not present

## 2016-05-15 DIAGNOSIS — M2560 Stiffness of unspecified joint, not elsewhere classified: Secondary | ICD-10-CM | POA: Diagnosis not present

## 2016-05-15 DIAGNOSIS — M6249 Contracture of muscle, multiple sites: Secondary | ICD-10-CM | POA: Diagnosis not present

## 2016-05-19 DIAGNOSIS — M2560 Stiffness of unspecified joint, not elsewhere classified: Secondary | ICD-10-CM | POA: Diagnosis not present

## 2016-05-19 DIAGNOSIS — M6250 Muscle wasting and atrophy, not elsewhere classified, unspecified site: Secondary | ICD-10-CM | POA: Diagnosis not present

## 2016-05-19 DIAGNOSIS — M6249 Contracture of muscle, multiple sites: Secondary | ICD-10-CM | POA: Diagnosis not present

## 2016-05-19 DIAGNOSIS — M546 Pain in thoracic spine: Secondary | ICD-10-CM | POA: Diagnosis not present

## 2016-05-21 DIAGNOSIS — M6250 Muscle wasting and atrophy, not elsewhere classified, unspecified site: Secondary | ICD-10-CM | POA: Diagnosis not present

## 2016-05-21 DIAGNOSIS — M6249 Contracture of muscle, multiple sites: Secondary | ICD-10-CM | POA: Diagnosis not present

## 2016-05-21 DIAGNOSIS — M2560 Stiffness of unspecified joint, not elsewhere classified: Secondary | ICD-10-CM | POA: Diagnosis not present

## 2016-05-21 DIAGNOSIS — M546 Pain in thoracic spine: Secondary | ICD-10-CM | POA: Diagnosis not present

## 2016-05-26 DIAGNOSIS — M2560 Stiffness of unspecified joint, not elsewhere classified: Secondary | ICD-10-CM | POA: Diagnosis not present

## 2016-05-26 DIAGNOSIS — M6250 Muscle wasting and atrophy, not elsewhere classified, unspecified site: Secondary | ICD-10-CM | POA: Diagnosis not present

## 2016-05-26 DIAGNOSIS — M546 Pain in thoracic spine: Secondary | ICD-10-CM | POA: Diagnosis not present

## 2016-05-26 DIAGNOSIS — M6249 Contracture of muscle, multiple sites: Secondary | ICD-10-CM | POA: Diagnosis not present

## 2016-05-28 DIAGNOSIS — M6249 Contracture of muscle, multiple sites: Secondary | ICD-10-CM | POA: Diagnosis not present

## 2016-05-28 DIAGNOSIS — M6250 Muscle wasting and atrophy, not elsewhere classified, unspecified site: Secondary | ICD-10-CM | POA: Diagnosis not present

## 2016-05-28 DIAGNOSIS — M2560 Stiffness of unspecified joint, not elsewhere classified: Secondary | ICD-10-CM | POA: Diagnosis not present

## 2016-05-28 DIAGNOSIS — M546 Pain in thoracic spine: Secondary | ICD-10-CM | POA: Diagnosis not present

## 2016-06-02 DIAGNOSIS — M6249 Contracture of muscle, multiple sites: Secondary | ICD-10-CM | POA: Diagnosis not present

## 2016-06-02 DIAGNOSIS — M546 Pain in thoracic spine: Secondary | ICD-10-CM | POA: Diagnosis not present

## 2016-06-02 DIAGNOSIS — M2560 Stiffness of unspecified joint, not elsewhere classified: Secondary | ICD-10-CM | POA: Diagnosis not present

## 2016-06-02 DIAGNOSIS — M6250 Muscle wasting and atrophy, not elsewhere classified, unspecified site: Secondary | ICD-10-CM | POA: Diagnosis not present

## 2016-06-05 DIAGNOSIS — M6249 Contracture of muscle, multiple sites: Secondary | ICD-10-CM | POA: Diagnosis not present

## 2016-06-05 DIAGNOSIS — M6250 Muscle wasting and atrophy, not elsewhere classified, unspecified site: Secondary | ICD-10-CM | POA: Diagnosis not present

## 2016-06-05 DIAGNOSIS — M2560 Stiffness of unspecified joint, not elsewhere classified: Secondary | ICD-10-CM | POA: Diagnosis not present

## 2016-06-05 DIAGNOSIS — M546 Pain in thoracic spine: Secondary | ICD-10-CM | POA: Diagnosis not present

## 2016-06-08 DIAGNOSIS — Z9889 Other specified postprocedural states: Secondary | ICD-10-CM | POA: Diagnosis not present

## 2016-06-09 DIAGNOSIS — M546 Pain in thoracic spine: Secondary | ICD-10-CM | POA: Diagnosis not present

## 2016-06-09 DIAGNOSIS — M6250 Muscle wasting and atrophy, not elsewhere classified, unspecified site: Secondary | ICD-10-CM | POA: Diagnosis not present

## 2016-06-09 DIAGNOSIS — M2560 Stiffness of unspecified joint, not elsewhere classified: Secondary | ICD-10-CM | POA: Diagnosis not present

## 2016-06-09 DIAGNOSIS — M6249 Contracture of muscle, multiple sites: Secondary | ICD-10-CM | POA: Diagnosis not present

## 2016-06-09 MED FILL — LYRICA 150 MG CAPSULE: 150 | 30 days supply | Qty: 90 | Fill #0

## 2016-06-11 DIAGNOSIS — M6250 Muscle wasting and atrophy, not elsewhere classified, unspecified site: Secondary | ICD-10-CM | POA: Diagnosis not present

## 2016-06-11 DIAGNOSIS — M2560 Stiffness of unspecified joint, not elsewhere classified: Secondary | ICD-10-CM | POA: Diagnosis not present

## 2016-06-11 DIAGNOSIS — M546 Pain in thoracic spine: Secondary | ICD-10-CM | POA: Diagnosis not present

## 2016-06-11 DIAGNOSIS — M6249 Contracture of muscle, multiple sites: Secondary | ICD-10-CM | POA: Diagnosis not present

## 2016-06-16 DIAGNOSIS — M546 Pain in thoracic spine: Secondary | ICD-10-CM | POA: Diagnosis not present

## 2016-06-16 DIAGNOSIS — M6249 Contracture of muscle, multiple sites: Secondary | ICD-10-CM | POA: Diagnosis not present

## 2016-06-16 DIAGNOSIS — M2560 Stiffness of unspecified joint, not elsewhere classified: Secondary | ICD-10-CM | POA: Diagnosis not present

## 2016-06-16 DIAGNOSIS — M6250 Muscle wasting and atrophy, not elsewhere classified, unspecified site: Secondary | ICD-10-CM | POA: Diagnosis not present

## 2016-06-18 DIAGNOSIS — M546 Pain in thoracic spine: Secondary | ICD-10-CM | POA: Diagnosis not present

## 2016-06-18 DIAGNOSIS — M2560 Stiffness of unspecified joint, not elsewhere classified: Secondary | ICD-10-CM | POA: Diagnosis not present

## 2016-06-18 DIAGNOSIS — M6249 Contracture of muscle, multiple sites: Secondary | ICD-10-CM | POA: Diagnosis not present

## 2016-06-18 DIAGNOSIS — M6250 Muscle wasting and atrophy, not elsewhere classified, unspecified site: Secondary | ICD-10-CM | POA: Diagnosis not present

## 2016-06-22 ENCOUNTER — Telehealth: Payer: Self-pay | Admitting: Family Medicine

## 2016-06-22 DIAGNOSIS — M546 Pain in thoracic spine: Secondary | ICD-10-CM | POA: Diagnosis not present

## 2016-06-22 DIAGNOSIS — M6249 Contracture of muscle, multiple sites: Secondary | ICD-10-CM | POA: Diagnosis not present

## 2016-06-22 DIAGNOSIS — M6250 Muscle wasting and atrophy, not elsewhere classified, unspecified site: Secondary | ICD-10-CM | POA: Diagnosis not present

## 2016-06-22 DIAGNOSIS — M2560 Stiffness of unspecified joint, not elsewhere classified: Secondary | ICD-10-CM | POA: Diagnosis not present

## 2016-06-22 MED ORDER — EZETIMIBE 10 MG PO TABS: 10.0000 mg | ORAL_TABLET | Freq: Every day | ORAL | 3 refills | Status: DC

## 2016-06-22 MED ORDER — ZOLPIDEM TARTRATE 10 MG PO TABS: ORAL_TABLET | ORAL | 2 refills | Status: DC

## 2016-06-22 NOTE — Telephone Encounter (Signed)
okay

## 2016-06-22 NOTE — Telephone Encounter (Signed)
Prescription sent to pharmacy for [place].   Ok to refill Ambien??  Last office visit 01/15/2016.  Last refill 03/23/2016.

## 2016-06-22 NOTE — Telephone Encounter (Signed)
Medication called to pharmacy. 

## 2016-06-22 NOTE — Telephone Encounter (Signed)
AMBIEN AND ZETIA IS WHAT HE NEEDS REFILLS ON , HE HAS CHANGED TO [facility]

## 2016-06-23 DIAGNOSIS — M6249 Contracture of muscle, multiple sites: Secondary | ICD-10-CM | POA: Diagnosis not present

## 2016-06-23 DIAGNOSIS — M546 Pain in thoracic spine: Secondary | ICD-10-CM | POA: Diagnosis not present

## 2016-06-23 DIAGNOSIS — M6250 Muscle wasting and atrophy, not elsewhere classified, unspecified site: Secondary | ICD-10-CM | POA: Diagnosis not present

## 2016-06-23 DIAGNOSIS — M2560 Stiffness of unspecified joint, not elsewhere classified: Secondary | ICD-10-CM | POA: Diagnosis not present

## 2016-06-23 MED FILL — EZETIMIBE 10 MG TABLET: 10 | 90 days supply | Qty: 90 | Fill #0

## 2016-06-23 MED FILL — rOPINIRole HCL 0.5 MG TABS: 0.5 | 15 days supply | Qty: 90 | Fill #0

## 2016-06-23 MED FILL — ZOLPIDEM TARTRATE 10 MG TAB: 10 | 30 days supply | Qty: 30 | Fill #0

## 2016-06-29 DIAGNOSIS — M546 Pain in thoracic spine: Secondary | ICD-10-CM | POA: Diagnosis not present

## 2016-06-29 DIAGNOSIS — M6250 Muscle wasting and atrophy, not elsewhere classified, unspecified site: Secondary | ICD-10-CM | POA: Diagnosis not present

## 2016-06-29 DIAGNOSIS — M2560 Stiffness of unspecified joint, not elsewhere classified: Secondary | ICD-10-CM | POA: Diagnosis not present

## 2016-06-29 DIAGNOSIS — M6249 Contracture of muscle, multiple sites: Secondary | ICD-10-CM | POA: Diagnosis not present

## 2016-06-30 DIAGNOSIS — M2560 Stiffness of unspecified joint, not elsewhere classified: Secondary | ICD-10-CM | POA: Diagnosis not present

## 2016-06-30 DIAGNOSIS — M6249 Contracture of muscle, multiple sites: Secondary | ICD-10-CM | POA: Diagnosis not present

## 2016-06-30 DIAGNOSIS — M6250 Muscle wasting and atrophy, not elsewhere classified, unspecified site: Secondary | ICD-10-CM | POA: Diagnosis not present

## 2016-06-30 DIAGNOSIS — M546 Pain in thoracic spine: Secondary | ICD-10-CM | POA: Diagnosis not present

## 2016-07-01 DIAGNOSIS — G4733 Obstructive sleep apnea (adult) (pediatric): Secondary | ICD-10-CM | POA: Diagnosis not present

## 2016-07-06 DIAGNOSIS — M6249 Contracture of muscle, multiple sites: Secondary | ICD-10-CM | POA: Diagnosis not present

## 2016-07-06 DIAGNOSIS — M6250 Muscle wasting and atrophy, not elsewhere classified, unspecified site: Secondary | ICD-10-CM | POA: Diagnosis not present

## 2016-07-06 DIAGNOSIS — M546 Pain in thoracic spine: Secondary | ICD-10-CM | POA: Diagnosis not present

## 2016-07-06 DIAGNOSIS — M2560 Stiffness of unspecified joint, not elsewhere classified: Secondary | ICD-10-CM | POA: Diagnosis not present

## 2016-07-08 DIAGNOSIS — M546 Pain in thoracic spine: Secondary | ICD-10-CM | POA: Diagnosis not present

## 2016-07-08 DIAGNOSIS — M6250 Muscle wasting and atrophy, not elsewhere classified, unspecified site: Secondary | ICD-10-CM | POA: Diagnosis not present

## 2016-07-08 DIAGNOSIS — M2560 Stiffness of unspecified joint, not elsewhere classified: Secondary | ICD-10-CM | POA: Diagnosis not present

## 2016-07-08 DIAGNOSIS — M6249 Contracture of muscle, multiple sites: Secondary | ICD-10-CM | POA: Diagnosis not present

## 2016-07-13 DIAGNOSIS — M6249 Contracture of muscle, multiple sites: Secondary | ICD-10-CM | POA: Diagnosis not present

## 2016-07-13 DIAGNOSIS — M546 Pain in thoracic spine: Secondary | ICD-10-CM | POA: Diagnosis not present

## 2016-07-13 DIAGNOSIS — M2560 Stiffness of unspecified joint, not elsewhere classified: Secondary | ICD-10-CM | POA: Diagnosis not present

## 2016-07-13 DIAGNOSIS — M6250 Muscle wasting and atrophy, not elsewhere classified, unspecified site: Secondary | ICD-10-CM | POA: Diagnosis not present

## 2016-07-14 DIAGNOSIS — M6249 Contracture of muscle, multiple sites: Secondary | ICD-10-CM | POA: Diagnosis not present

## 2016-07-14 DIAGNOSIS — M2560 Stiffness of unspecified joint, not elsewhere classified: Secondary | ICD-10-CM | POA: Diagnosis not present

## 2016-07-14 DIAGNOSIS — M6250 Muscle wasting and atrophy, not elsewhere classified, unspecified site: Secondary | ICD-10-CM | POA: Diagnosis not present

## 2016-07-14 DIAGNOSIS — M546 Pain in thoracic spine: Secondary | ICD-10-CM | POA: Diagnosis not present

## 2016-07-16 MED FILL — LYRICA 150 MG CAPSULE: 150 | 30 days supply | Qty: 90 | Fill #1

## 2016-07-28 MED ORDER — METFORMIN HCL ER 500 MG PO TB24
ORAL_TABLET | 0 refills | Status: AC
Start: 2016-07-28 — End: 2017-04-03

## 2016-08-06 MED FILL — ZOLPIDEM TARTRATE 10 MG TAB: 10 | 30 days supply | Qty: 30 | Fill #1

## 2016-08-17 ENCOUNTER — Telehealth: Payer: Self-pay | Admitting: Family Medicine

## 2016-08-17 MED ORDER — VARENICLINE TARTRATE 0.5 MG X 11 & 1 MG X 42 PO MISC: ORAL | 0 refills | Status: DC

## 2016-08-17 MED ORDER — ONETOUCH ULTRA BLUE VI STRP
ORAL_STRIP | 3 refills | Status: AC
Start: 2016-08-17 — End: ?

## 2016-08-17 MED FILL — CHANTIX STARTING MONTH BOX: 0.5 MG X 11 | 28 days supply | Qty: 53 | Fill #0

## 2016-08-17 NOTE — Telephone Encounter (Signed)
Ok to send prescription

## 2016-08-17 NOTE — Telephone Encounter (Signed)
Prescription sent to pharmacy.

## 2016-08-17 NOTE — Telephone Encounter (Signed)
okay

## 2016-08-17 NOTE — Telephone Encounter (Signed)
Patient is calling to see if chantix can be called [facility] if possible  [phone]

## 2016-08-19 ENCOUNTER — Encounter: Payer: Self-pay | Admitting: Family Medicine

## 2016-09-07 ENCOUNTER — Other Ambulatory Visit: Payer: Self-pay | Admitting: Family Medicine

## 2016-09-07 ENCOUNTER — Encounter: Payer: Self-pay | Admitting: Family Medicine

## 2016-09-07 ENCOUNTER — Ambulatory Visit (INDEPENDENT_AMBULATORY_CARE_PROVIDER_SITE_OTHER): Payer: Medicare Other | Admitting: Family Medicine

## 2016-09-07 ENCOUNTER — Ambulatory Visit (HOSPITAL_COMMUNITY)
Admission: RE | Admit: 2016-09-07 | Discharge: 2016-09-07 | Disposition: A | Discharge status: Home / Self Care | Payer: Medicare Other | Source: Ambulatory Visit | Attending: Family Medicine | Admitting: Family Medicine

## 2016-09-07 VITALS — BP 148/94 | HR 84 | Temp 97.9°F | Resp 18 | Ht 72.0 in | Wt 245.0 lb

## 2016-09-07 DIAGNOSIS — Z9049 Acquired absence of other specified parts of digestive tract: Secondary | ICD-10-CM | POA: Insufficient documentation

## 2016-09-07 DIAGNOSIS — K5909 Other constipation: Secondary | ICD-10-CM

## 2016-09-07 DIAGNOSIS — R1031 Right lower quadrant pain: Secondary | ICD-10-CM

## 2016-09-07 DIAGNOSIS — Z981 Arthrodesis status: Secondary | ICD-10-CM | POA: Diagnosis not present

## 2016-09-07 LAB — URINALYSIS, ROUTINE W REFLEX MICROSCOPIC
BILIRUBIN URINE: NEGATIVE
Glucose, UA: NEGATIVE
Hgb urine dipstick: NEGATIVE
KETONES UR: NEGATIVE
Leukocytes, UA: NEGATIVE
Nitrite: NEGATIVE
PH: 5.5 (ref 5.0–8.0)
Protein, ur: NEGATIVE
SPECIFIC GRAVITY, URINE: 1.015 (ref 1.001–1.035)

## 2016-09-07 MED ORDER — ROPINIROLE HCL 0.5 MG PO TABS: 0.5000 mg | ORAL_TABLET | Freq: Every day | ORAL | Status: DC

## 2016-09-07 MED ORDER — VALACYCLOVIR HCL 1 G PO TABS: 1000.0000 mg | ORAL_TABLET | Freq: Three times a day (TID) | ORAL | 0 refills | Status: DC

## 2016-09-07 MED FILL — valACYclovir HCL 1 GM TABS: 1 | 7 days supply | Qty: 21 | Fill #0

## 2016-09-07 NOTE — Progress Notes (Signed)
   Subjective:    Patient ID: Derek [name], male    DOB: 1961/02/06, 56 y.o.   MRN: [id number]  Patient presents for Burning in right side area   Right lower abdomen burning sensation for the past 2 weeks. Since his surgery for his spine for the benign tumor, continues to have constipation, uses Linzess, but has diarrhea with it, so only takes  No burning with urination but at times does not feel like bladder has emptied all the way  Tried chantix but had headaches   Reviewed medications, not on flexeril  But does take lyrica and oxycodone as needed   Review Of Systems:  GEN- denies fatigue, fever, weight loss,weakness, recent illness HEENT- denies eye drainage, change in vision, nasal discharge, CVS- denies chest pain, palpitations RESP- denies SOB, cough, wheeze ABD- denies N/V, change in stools, +bd pain GU- denies dysuria, hematuria, dribbling, incontinence MSK- + joint pain, muscle aches, injury Neuro- denies headache, dizziness, syncope, seizure activity       Objective:    BP (!) 148/94   Pulse 84   Temp 97.9 F (36.6 C) (Oral)   Resp 18   Ht 6' (1.829 m)   Wt 245 lb (111.1 kg)   BMI 33.23 kg/m  GEN- NAD, alert and oriented x3 HEENT- PERRL, EOMI, non injected sclera, pink conjunctiva, MMM, oropharynx clear CVS- RRR, no murmur RESP-CTAB ABD-NABS,soft,NT,ND, no appreciable mass, no  CVA tenderness EXT- No edema Pulses- Radial  2+        Assessment & Plan:      Problem List Items Addressed This Visit    None    Visit Diagnoses    Right lower quadrant abdominal pain    -  Primary   interesting symptoms with just the burning sensation, exam was benign, DD constipation, no sign of UTI, possible prodrome of shingles. KUB obtained neg, UA neg,He is in considerable pain with this burning sensation across his skin and not particularly seen. A bit and go ahead and start him onValtrex to cover for shingles. Heart he has pain medications.    Relevant Orders   DG  Abd 1 View (Completed)   Urinalysis, Routine w reflex microscopic (Completed)   Chronic constipation       KUB no constipation noted, decrease linzess to 72mg , given samples to try   Relevant Orders   DG Abd 1 View (Completed)      Note: This dictation was prepared with Dragon dictation along with smaller phrase technology. Any transcriptional errors that result from this process are unintentional.

## 2016-09-07 NOTE — Patient Instructions (Signed)
Get the xray done at [facility] We will call with results F/U pending results

## 2016-09-08 ENCOUNTER — Encounter: Payer: Self-pay | Admitting: Family Medicine

## 2016-09-08 MED FILL — ZOLPIDEM TARTRATE 10 MG TAB: 10 | 30 days supply | Qty: 30 | Fill #2

## 2016-09-11 MED FILL — LYRICA 150 MG CAPSULE: 150 | 30 days supply | Qty: 90 | Fill #2

## 2016-09-11 MED FILL — rOPINIRole HCL 0.5 MG TABS: 0.5 | 90 days supply | Qty: 90 | Fill #0

## 2016-09-16 ENCOUNTER — Encounter: Payer: Self-pay | Admitting: Family Medicine

## 2016-09-16 DIAGNOSIS — R1031 Right lower quadrant pain: Secondary | ICD-10-CM

## 2016-09-24 ENCOUNTER — Ambulatory Visit: Payer: BLUE CROSS/BLUE SHIELD

## 2016-09-24 DIAGNOSIS — E118 Type 2 diabetes mellitus with unspecified complications: Secondary | ICD-10-CM

## 2016-09-24 DIAGNOSIS — M25472 Effusion, left ankle: Secondary | ICD-10-CM

## 2016-09-24 DIAGNOSIS — M25475 Effusion, left foot: Secondary | ICD-10-CM

## 2016-09-24 MED ORDER — NAPROXEN SODIUM 550 MG PO TABS
550 mg | ORAL_TABLET | Freq: Two times a day (BID) | ORAL | 0 refills | Status: AC
Start: 2016-09-24 — End: 2018-06-23

## 2016-09-25 ENCOUNTER — Telehealth: Payer: BLUE CROSS/BLUE SHIELD

## 2016-09-28 ENCOUNTER — Encounter: Payer: Self-pay | Admitting: Family Medicine

## 2016-09-28 MED ORDER — EZETIMIBE 10 MG PO TABS: 10.0000 mg | ORAL_TABLET | Freq: Every day | ORAL | 3 refills | Status: DC

## 2016-09-29 ENCOUNTER — Ambulatory Visit (HOSPITAL_COMMUNITY)
Admission: RE | Admit: 2016-09-29 | Discharge: 2016-09-29 | Disposition: A | Discharge status: Home / Self Care | Payer: 59 | Source: Ambulatory Visit | Attending: Family Medicine | Admitting: Family Medicine

## 2016-09-29 DIAGNOSIS — R1031 Right lower quadrant pain: Secondary | ICD-10-CM | POA: Insufficient documentation

## 2016-09-29 DIAGNOSIS — K5900 Constipation, unspecified: Secondary | ICD-10-CM | POA: Diagnosis not present

## 2016-09-29 MED ORDER — IOPAMIDOL (ISOVUE-300) INJECTION 61%
100.0000 mL | Freq: Once | INTRAVENOUS | Status: AC | PRN
  Administered 2016-09-29: 100 mL via INTRAVENOUS

## 2016-09-30 ENCOUNTER — Other Ambulatory Visit: Payer: Self-pay | Admitting: *Deleted

## 2016-09-30 ENCOUNTER — Encounter: Payer: Self-pay | Admitting: *Deleted

## 2016-09-30 NOTE — Telephone Encounter (Signed)
This encounter was created in error - please disregard.

## 2016-10-01 ENCOUNTER — Telehealth: Payer: Self-pay | Admitting: *Deleted

## 2016-10-01 DIAGNOSIS — R1031 Right lower quadrant pain: Secondary | ICD-10-CM

## 2016-10-01 NOTE — Telephone Encounter (Signed)
Received call from patient.   Reports that he had increase in pain last night so much so that he had difficulty sleeping.   Requested referral to GI (Dr. Sydell Axon).   Referral orders placed.

## 2016-10-02 NOTE — Telephone Encounter (Signed)
noted 

## 2016-10-06 ENCOUNTER — Encounter: Payer: Self-pay | Admitting: Nurse Practitioner

## 2016-10-14 ENCOUNTER — Other Ambulatory Visit: Payer: Self-pay | Admitting: Family Medicine

## 2016-10-14 NOTE — Telephone Encounter (Signed)
Ok to refill 

## 2016-10-15 ENCOUNTER — Encounter: Payer: Self-pay | Admitting: Family Medicine

## 2016-10-15 MED ORDER — EZETIMIBE 10 MG PO TABS: 10.0000 mg | ORAL_TABLET | Freq: Every day | ORAL | 3 refills | Status: DC

## 2016-10-15 NOTE — Telephone Encounter (Signed)
ok 

## 2016-10-15 NOTE — Telephone Encounter (Signed)
Medication called to pharmacy. 

## 2016-10-16 MED ORDER — PANTOPRAZOLE SODIUM 40 MG PO TBEC: 40.0000 mg | DELAYED_RELEASE_TABLET | Freq: Two times a day (BID) | ORAL | 3 refills | Status: DC

## 2016-10-16 MED FILL — PANTOPRAZOLE SOD DR 40 MG T: 40 | 90 days supply | Qty: 180 | Fill #0 | Status: TO

## 2016-10-16 MED FILL — ZOLPIDEM TARTRATE 10 MG TAB: 10 | 30 days supply | Qty: 30 | Fill #0

## 2016-10-16 MED FILL — LYRICA 150 MG CAPSULE: 150 | 30 days supply | Qty: 90 | Fill #0

## 2016-10-16 NOTE — Addendum Note (Signed)
Addended by: Sheral Flow on: 10/16/2016 10:52 AM   Modules accepted: Orders

## 2016-10-27 MED ORDER — METFORMIN HCL ER 500 MG PO TB24
ORAL_TABLET | 0 refills
Start: 2016-10-27 — End: ?

## 2016-11-03 MED FILL — EZETIMIBE 10 MG TAB: 10 | 90 days supply | Qty: 90 | Fill #1

## 2016-11-17 MED FILL — ZOLPIDEM TARTRATE 10 MG TAB: 10 | 30 days supply | Qty: 30 | Fill #1

## 2016-11-30 ENCOUNTER — Ambulatory Visit: Payer: Medicare Other | Admitting: Nurse Practitioner

## 2016-12-04 MED FILL — LYRICA 150 MG CAPSULE: 150 | 30 days supply | Qty: 90 | Fill #1

## 2016-12-21 MED FILL — ZOLPIDEM TARTRATE 10 MG TAB: 10 | 30 days supply | Qty: 30 | Fill #2

## 2016-12-21 MED FILL — rOPINIRole HCL 0.5 MG TABS: 0.5 | 90 days supply | Qty: 90 | Fill #0 | Status: TO

## 2017-01-15 DIAGNOSIS — M545 Low back pain: Secondary | ICD-10-CM | POA: Diagnosis not present

## 2017-01-15 DIAGNOSIS — M79606 Pain in leg, unspecified: Secondary | ICD-10-CM | POA: Diagnosis not present

## 2017-01-15 DIAGNOSIS — G4459 Other complicated headache syndrome: Secondary | ICD-10-CM | POA: Diagnosis not present

## 2017-01-15 DIAGNOSIS — Z79899 Other long term (current) drug therapy: Secondary | ICD-10-CM | POA: Diagnosis not present

## 2017-01-19 DIAGNOSIS — E785 Hyperlipidemia, unspecified: Secondary | ICD-10-CM | POA: Diagnosis not present

## 2017-01-19 DIAGNOSIS — M545 Low back pain: Secondary | ICD-10-CM | POA: Diagnosis not present

## 2017-01-19 DIAGNOSIS — G4733 Obstructive sleep apnea (adult) (pediatric): Secondary | ICD-10-CM | POA: Diagnosis not present

## 2017-01-19 DIAGNOSIS — G4709 Other insomnia: Secondary | ICD-10-CM | POA: Diagnosis not present

## 2017-01-26 ENCOUNTER — Ambulatory Visit (INDEPENDENT_AMBULATORY_CARE_PROVIDER_SITE_OTHER): Payer: Medicare Other

## 2017-01-26 DIAGNOSIS — Z23 Encounter for immunization: Secondary | ICD-10-CM

## 2017-01-26 NOTE — Progress Notes (Signed)
Patient was seen in office to receive the flu vaccine.Patient received vaccine in right deltoid patient tolerate well.

## 2017-02-01 MED FILL — LYRICA 150 MG CAPSULE: 150 | 30 days supply | Qty: 90 | Fill #2

## 2017-02-03 ENCOUNTER — Telehealth: Payer: Self-pay | Admitting: *Deleted

## 2017-02-03 NOTE — Telephone Encounter (Signed)
Okay to refill? 

## 2017-02-03 NOTE — Telephone Encounter (Signed)
Received fax requesting refill on Ambien.   Ok to refill??  Last office visit 09/07/2016.   Last refill 10/15/2016, #2 refills.

## 2017-02-04 MED ORDER — ZOLPIDEM TARTRATE 10 MG PO TABS: ORAL_TABLET | ORAL | 2 refills | Status: DC

## 2017-02-04 MED FILL — ZOLPIDEM TARTRATE 10 MG TAB: 10 | 30 days supply | Qty: 30 | Fill #0

## 2017-02-04 NOTE — Telephone Encounter (Signed)
Medication called to pharmacy. 

## 2017-02-08 MED ORDER — ATORVASTATIN CALCIUM 80 MG PO TABS
ORAL_TABLET | 3 refills | Status: AC
Start: 2017-02-08 — End: 2017-11-18

## 2017-02-10 DIAGNOSIS — G4733 Obstructive sleep apnea (adult) (pediatric): Secondary | ICD-10-CM | POA: Diagnosis not present

## 2017-02-22 MED ORDER — ONETOUCH DELICA LANCETS FINE MISC
3 refills | Status: AC
Start: 2017-02-22 — End: ?

## 2017-03-11 MED FILL — LYRICA 150 MG CAPSULE: 150 | 30 days supply | Qty: 90 | Fill #0 | Status: TO

## 2017-03-15 MED FILL — EZETIMIBE 10 MG TABS: 10 | 90 days supply | Qty: 90 | Fill #2 | Status: TO

## 2017-03-15 MED FILL — ZOLPIDEM TARTRATE 10 MG TAB: 10 | 30 days supply | Qty: 30 | Fill #1 | Status: TO

## 2017-03-22 MED FILL — rOPINIRole HCL 0.5 MG TABS: 0.5 | 90 days supply | Qty: 90 | Fill #0

## 2017-03-22 MED FILL — PANTOPRAZOLE SOD DR 40 MG T: 40 | 90 days supply | Qty: 180 | Fill #0

## 2017-03-29 ENCOUNTER — Telehealth: Payer: Medicare Other | Admitting: Family

## 2017-03-29 DIAGNOSIS — R0602 Shortness of breath: Secondary | ICD-10-CM

## 2017-03-29 DIAGNOSIS — R6889 Other general symptoms and signs: Secondary | ICD-10-CM

## 2017-03-29 NOTE — Progress Notes (Signed)
Based on what you shared with me it looks like you have a serious condition that should be evaluated in a face to face office visit.  NOTE: Even if you have entered your credit card information for this eVisit, you will not be charged.   If you are having a true medical emergency please call 911.  If you need an urgent face to face visit, [facility] has four urgent care centers for your convenience.  If you need care fast and have a high deductible or no insurance consider:   [email or web address]  [phone]  [address], [address] [place], [place] [address] 8 am to 8 pm Monday-Friday 10 am to 4 pm Saturday-Sunday   The following sites will take your  insurance:    . [facility]  [phone] Get Driving Directions Find a Provider at this Location  [address] [place], [place] [address] . 10 am to 8 pm Monday-Friday . 12 pm to 8 pm Saturday-Sunday   . [facility]  [phone] Get Driving Directions Find a Provider at this Location  [address], [address] [place], [place] [address] . 8 am to 8 pm Monday-Friday . 9 am to 6 pm Saturday . 11 am to 6 pm Sunday   . [facility] [place]  [phone] Get Driving Directions  [address] [place], [place] [address] . 8 am to 8 pm Monday-Friday . 8 am to 4 pm Saturday-Sunday   Your e-visit answers were reviewed by a board certified advanced clinical practitioner to complete your personal care plan.  Thank you for using e-Visits.  

## 2017-04-01 ENCOUNTER — Encounter: Payer: Self-pay | Admitting: Family Medicine

## 2017-04-01 ENCOUNTER — Telehealth: Payer: Self-pay | Admitting: Family Medicine

## 2017-04-01 ENCOUNTER — Ambulatory Visit (INDEPENDENT_AMBULATORY_CARE_PROVIDER_SITE_OTHER): Payer: 59 | Admitting: Family Medicine

## 2017-04-01 VITALS — BP 110/76 | HR 68 | Temp 98.2°F | Resp 16 | Ht 72.0 in | Wt 238.0 lb

## 2017-04-01 DIAGNOSIS — J208 Acute bronchitis due to other specified organisms: Secondary | ICD-10-CM | POA: Diagnosis not present

## 2017-04-01 DIAGNOSIS — J209 Acute bronchitis, unspecified: Secondary | ICD-10-CM

## 2017-04-01 MED ORDER — PREDNISONE 20 MG PO TABS
ORAL_TABLET | ORAL | 0 refills | Status: DC
Start: 2017-04-01 — End: 2017-04-01

## 2017-04-01 MED ORDER — ALBUTEROL SULFATE HFA 108 (90 BASE) MCG/ACT IN AERS: 2.0000 | INHALATION_SPRAY | Freq: Four times a day (QID) | RESPIRATORY_TRACT | 0 refills | Status: DC | PRN

## 2017-04-01 MED ORDER — PREDNISONE 20 MG PO TABS: ORAL_TABLET | ORAL | 0 refills | Status: DC

## 2017-04-01 MED FILL — VENTOLIN HFA 90 MCG INHALER: 108 (90 BAS | 25 days supply | Qty: 18 | Fill #0

## 2017-04-01 MED FILL — predniSONE 20 MG TABS: 20 | 6 days supply | Qty: 12 | Fill #0

## 2017-04-01 NOTE — Telephone Encounter (Signed)
Medication called/sent to requested pharmacy  

## 2017-04-01 NOTE — Telephone Encounter (Signed)
Patient states he went to the pharmacy to pick up the two prescriptions Dr. Dennard Schaumann sent in today and they don't take his insurance. He would like these called into [facility] on [address] albuterol, and prednisone.  CB#  [phone]
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
## 2017-04-02 MED ORDER — METFORMIN HCL ER 500 MG PO TB24
ORAL_TABLET | 0 refills
Start: 2017-04-02 — End: ?

## 2017-04-02 MED ORDER — METFORMIN HCL ER 500 MG PO TB24
ORAL_TABLET | 0 refills | Status: AC
Start: 2017-04-02 — End: 2017-09-02

## 2017-04-03 ENCOUNTER — Telehealth: Payer: BLUE CROSS/BLUE SHIELD

## 2017-04-03 NOTE — Telephone Encounter
Message to Practice/Provider    MD: York CeriseMichaelson    Message: Per Pt he will make appointment when he gets back from vacation and has enough medication for now    Return call is not being requested by the patient or caller.    Patient or caller has been notified of the 24-48 hour processing turnaround time if applicable.yes  Thank you

## 2017-04-08 MED ORDER — LOSARTAN POTASSIUM 50 MG PO TABS
ORAL_TABLET | 0 refills | Status: AC
Start: 2017-04-08 — End: 2017-07-07

## 2017-04-14 MED FILL — ZOLPIDEM TARTRATE 10 MG TAB: 10 | 30 days supply | Qty: 30 | Fill #0

## 2017-04-14 MED FILL — LYRICA 150 MG CAPSULE: 150 | 30 days supply | Qty: 90 | Fill #0

## 2017-04-14 MED FILL — SHIPPING COST: 1 days supply | Qty: 1 | Fill #0

## 2017-05-13 ENCOUNTER — Other Ambulatory Visit: Payer: Self-pay | Admitting: Family Medicine

## 2017-05-13 NOTE — Telephone Encounter (Signed)
Ok to refill??  Last office visit 04/01/2017.  Last refill 02/04/2017, #2 refills.

## 2017-05-14 MED FILL — ZOLPIDEM TARTRATE 10 MG TAB: 10 | 30 days supply | Qty: 30 | Fill #0

## 2017-05-14 MED FILL — SHIPPING COST: 1 days supply | Qty: 1 | Fill #1

## 2017-05-24 ENCOUNTER — Ambulatory Visit (INDEPENDENT_AMBULATORY_CARE_PROVIDER_SITE_OTHER): Payer: No Typology Code available for payment source | Admitting: Family Medicine

## 2017-05-24 ENCOUNTER — Encounter: Payer: Self-pay | Admitting: Family Medicine

## 2017-05-24 ENCOUNTER — Other Ambulatory Visit: Payer: Self-pay

## 2017-05-24 VITALS — BP 114/78 | HR 89 | Temp 98.4°F | Resp 16 | Wt 241.8 lb

## 2017-05-24 DIAGNOSIS — J018 Other acute sinusitis: Secondary | ICD-10-CM

## 2017-05-24 MED ORDER — AMOXICILLIN 875 MG PO TABS: 875.0000 mg | ORAL_TABLET | Freq: Two times a day (BID) | ORAL | 0 refills | Status: DC

## 2017-05-24 MED FILL — AMOXICILLIN 875 MG TABLET: 875 | 10 days supply | Qty: 20 | Fill #0

## 2017-05-24 NOTE — Progress Notes (Signed)
   Subjective:    Patient ID: Derek [name], male    DOB: July 16, 1960, 57 y.o.   MRN: [id number]  Patient presents for Headache (x3days ) and sinus pressure  Pt here with headache, sinus pressure/ drainage for the past 3 days. No fever. Used sinus OTC meds , mucous relief with little improvement. Some sore throat. Nose continues to stop up No cough.  TYlenol did help is headache.      Review Of Systems:  GEN- denies fatigue, fever, weight loss,weakness, recent illness HEENT- denies eye drainage, change in vision,+ nasal discharge, CVS- denies chest pain, palpitations RESP- denies SOB, cough, wheeze Neuro- + headache, dizziness, syncope, seizure activity       Objective:    BP 114/78   Pulse 89   Temp 98.4 F (36.9 C) (Oral)   Resp 16   Wt 241 lb 12.8 oz (109.7 kg)   SpO2 98%   BMI 32.79 kg/m  GEN- NAD, alert and oriented x3 HEENT- PERRL, EOMI, non injected sclera, pink conjunctiva, MMM, oropharynx mild injection, TM clear bilat no effusion,  No  maxillary sinus tenderness, inflammed turbinates,  Nasal drainage  Neck- Supple, no LAD CVS- RRR, no murmur RESP-CTAB Pulses- Radial 2+          Assessment & Plan:      Problem List Items Addressed This Visit    None    Visit Diagnoses    Other acute sinusitis, recurrence not specified    -  Primary   At this time more viral illness, will have him decongest with sudafed, use nasal saline/steroid, wait a a few days, if he does not clear up can start the amoxicillin   Relevant Medications   amoxicillin (AMOXIL) 875 MG tablet      Note: This dictation was prepared with Dragon dictation along with smaller phrase technology. Any transcriptional errors that result from this process are unintentional.

## 2017-05-24 NOTE — Patient Instructions (Addendum)
Sudafed for decongestant Nasal saline/nasacort  Use amoxicllin if you dont clear up.

## 2017-05-28 ENCOUNTER — Other Ambulatory Visit: Payer: Self-pay | Admitting: Family Medicine

## 2017-05-31 ENCOUNTER — Other Ambulatory Visit: Payer: Self-pay | Admitting: Family Medicine

## 2017-06-09 MED FILL — SHIPPING COST: 1 days supply | Qty: 1 | Fill #2

## 2017-06-09 MED FILL — rOPINIRole HCL 0.5 MG TABS: 0.5 | 90 days supply | Qty: 90 | Fill #0

## 2017-06-13 MED FILL — ZOLPIDEM TARTRATE 10 MG TAB: 10 | 30 days supply | Qty: 30 | Fill #1

## 2017-06-15 MED FILL — EZETIMIBE 10 MG TABS: 10 | 90 days supply | Qty: 90 | Fill #0

## 2017-06-15 MED FILL — SHIPPING COST: 1 days supply | Qty: 1 | Fill #3

## 2017-06-15 MED FILL — PANTOPRAZOLE SOD DR 40 MG T: 40 | 90 days supply | Qty: 180 | Fill #1

## 2017-06-15 MED FILL — LYRICA 150 MG CAPSULE: 150 | 30 days supply | Qty: 90 | Fill #1

## 2017-07-05 MED ORDER — METFORMIN HCL ER 500 MG PO TB24
ORAL_TABLET | 0 refills
Start: 2017-07-05 — End: ?

## 2017-07-07 MED ORDER — LOSARTAN POTASSIUM 50 MG PO TABS
ORAL_TABLET | 0 refills | Status: AC
Start: 2017-07-07 — End: 2017-09-02

## 2017-07-12 MED FILL — ZOLPIDEM TARTRATE 10 MG TAB: 10 | 30 days supply | Qty: 30 | Fill #2

## 2017-07-13 MED FILL — SHIPPING COST: 1 days supply | Qty: 1 | Fill #4

## 2017-08-02 ENCOUNTER — Other Ambulatory Visit: Payer: Self-pay | Admitting: *Deleted

## 2017-08-02 MED ORDER — LINACLOTIDE 145 MCG PO CAPS: ORAL_CAPSULE | ORAL | 11 refills | Status: DC

## 2017-08-02 MED FILL — LINZESS 145 MCG CAPSULE: 145 | 30 days supply | Qty: 30 | Fill #0

## 2017-08-02 MED FILL — SHIPPING COST: 1 days supply | Qty: 1 | Fill #5

## 2017-08-02 MED FILL — LYRICA 150 MG CAPSULE: 150 | 30 days supply | Qty: 90 | Fill #0

## 2017-08-09 ENCOUNTER — Other Ambulatory Visit: Payer: Self-pay | Admitting: Family Medicine

## 2017-08-09 MED FILL — ZOLPIDEM TARTRATE 10 MG TAB: 10 | 30 days supply | Qty: 30 | Fill #0

## 2017-08-09 MED FILL — SHIPPING COST: 1 days supply | Qty: 1 | Fill #6

## 2017-08-09 NOTE — Telephone Encounter (Signed)
Ok to refill??  Last office visit/ refill 05/14/2017, #2 refills.

## 2017-08-25 ENCOUNTER — Telehealth: Payer: BLUE CROSS/BLUE SHIELD

## 2017-08-25 NOTE — Telephone Encounter
Call Back Request    MD:  York Cerise    Reason for call back: Patient needs MD to put in lab orders for diabetes follow up. Please have MD place orders so he can have these done prior to his visit.     Any Symptoms:  []  Yes  []  No      ? If yes, what symptoms are you experiencing:    o Duration of symptoms (how long):    o Have you taken medication for symptoms (OTC or Rx):      Patient or caller has been notified of the 24-48 hour turnaround time.

## 2017-08-26 DIAGNOSIS — E119 Type 2 diabetes mellitus without complications: Secondary | ICD-10-CM

## 2017-08-26 MED FILL — LINZESS 145 MCG CAPSULE: 145 | 90 days supply | Qty: 90 | Fill #1

## 2017-08-26 MED FILL — SHIPPING COST: 1 days supply | Qty: 1 | Fill #7

## 2017-08-26 NOTE — Telephone Encounter
Detailed message left to pt regarding labs been order and needs to be fasting at least 9hrs before lab draw. May drink water, black coffee or tea.

## 2017-08-26 NOTE — Telephone Encounter
New orders for fasting lab are entered. Please advise patient.

## 2017-08-26 NOTE — Telephone Encounter
Pt is following up on labs request . Please assist at earliest convenience. Thank You

## 2017-08-26 NOTE — Telephone Encounter
Pt is scheduled f/u on 5-28 @ 11am for DM.   There's open order since 11/2015 can the pt use that or need more additional labs ?   Please advise

## 2017-08-28 ENCOUNTER — Institutional Professional Consult (permissible substitution): Payer: BLUE CROSS/BLUE SHIELD

## 2017-08-28 DIAGNOSIS — Z955 Presence of coronary angioplasty implant and graft: Secondary | ICD-10-CM

## 2017-08-28 DIAGNOSIS — E782 Mixed hyperlipidemia: Secondary | ICD-10-CM

## 2017-08-28 DIAGNOSIS — E119 Type 2 diabetes mellitus without complications: Secondary | ICD-10-CM

## 2017-08-28 DIAGNOSIS — I10 Essential (primary) hypertension: Secondary | ICD-10-CM

## 2017-08-28 LAB — UA,Dipstick: NITRITE: NEGATIVE (ref 5.0–8.0)

## 2017-08-28 LAB — Comprehensive Metabolic Panel
CHLORIDE: 99 mmol/L (ref 96–106)
CREATININE: 0.91 mg/dL (ref 0.60–1.30)
GLUCOSE: 110 mg/dL — ABNORMAL HIGH (ref 65–99)

## 2017-08-28 LAB — UA,Microscopic: SQUAMOUS EPITHELIAL CELLS: 0 {cells}/uL (ref 0–17)

## 2017-08-28 LAB — TSH with reflex FT4, FT3: TSH: 1.9 u[IU]/mL (ref 0.3–4.7)

## 2017-08-28 LAB — CBC: MCH CONCENTRATION: 32.9 g/dL (ref 31.5–35.5)

## 2017-08-28 LAB — Lipid Panel: CHOLESTEROL,LDL,CALCULATED: 70 mg/dL (ref <100)

## 2017-08-28 LAB — Differential Automated: ABSOLUTE EOS COUNT: 0.22 10*3/uL (ref 0.00–0.50)

## 2017-08-28 LAB — Albumin/Creatinine Ratio,Urine: ALBUMIN,URINE: <12 mg/L (ref <30.0)

## 2017-08-31 ENCOUNTER — Ambulatory Visit: Payer: BLUE CROSS/BLUE SHIELD

## 2017-08-31 LAB — Hgb A1c: HGB A1C - HPLC: 7.5 — ABNORMAL HIGH (ref <5.7)

## 2017-08-31 MED FILL — LYRICA 150 MG CAPSULE: 150 | 30 days supply | Qty: 90 | Fill #1

## 2017-08-31 MED FILL — SHIPPING COST: 1 days supply | Qty: 1 | Fill #8

## 2017-09-02 ENCOUNTER — Ambulatory Visit: Payer: BLUE CROSS/BLUE SHIELD

## 2017-09-02 DIAGNOSIS — E119 Type 2 diabetes mellitus without complications: Secondary | ICD-10-CM

## 2017-09-02 DIAGNOSIS — I2510 Atherosclerotic heart disease of native coronary artery without angina pectoris: Secondary | ICD-10-CM

## 2017-09-02 DIAGNOSIS — Z23 Encounter for immunization: Secondary | ICD-10-CM

## 2017-09-02 MED ORDER — METFORMIN HCL ER 500 MG PO TB24
ORAL_TABLET | 3 refills | Status: AC
Start: 2017-09-02 — End: 2017-11-18

## 2017-09-02 MED ORDER — LOSARTAN POTASSIUM 50 MG PO TABS
50 mg | ORAL_TABLET | Freq: Every day | ORAL | 3 refills | Status: AC
Start: 2017-09-02 — End: 2017-11-18

## 2017-09-02 NOTE — Progress Notes
PROGRESS NOTE    Patient:  Keith Cabrera    Medical record number:  [id number]    Date of birth:  17-Jan-1961        Chief complaint:    Chief Complaint   Patient presents with   ??? f/u on diabetes              Keith Cabrera is a 57 y.o. male        Who presents with the following history of present illness:    Type two diabetes with no neuropathy or vision problems. Blood sugars around 150 at home on maximum dosage metformin. Has not seen the diabetes specialist.  History of coronary artery disease, hypertension, hypercholesterolemia with no current chest pain SOB DOE palpitations.  Needs immunization update.      Past medical history:           Past Medical History:   Diagnosis Date   ??? Hypertension 04/28/2006    HYPERTENSION NOS       Past surgical history:         Past Surgical History:   Procedure Laterality Date   ??? COLONOSCOPY N/A 02/21/2016   ??? CORONARY ANGIOPLASTY  03/13/13       Medications:         Medications that the patient states to be currently taking   Medication Sig   ??? aspirin 81 mg EC tablet Take 81 mg by mouth daily.   ??? ATORVASTATIN 80 mg tablet TAKE 1 TABLET DAILY   ??? clopidogrel (PLAVIX) 75 mg tablet Take 75 mg by mouth daily .     ??? cyanocobalamin (CYANOCOBALAMIN) 1000 mcg tablet Take 1,000 mcg by mouth daily.   ??? Efinaconazole (JUBLIA) 10 % SOLN external solution Apply topically daily as needed (toenail fungus).   ??? hydrochlorothiazide 25 mg tablet Take 25 mg by mouth daily.   ??? HYDROCHLOROTHIAZIDE 25 mg tablet TAKE 1 TABLET DAILY   ??? losartan 50 mg tablet Take 1 tablet (50 mg total) by mouth daily.   ??? metFORMIN 500 mg ER 24 hr tablet TAKE 2 TABLETS TWICE A DAY (NEED CLINIC FOLLOW UP).   ??? naproxen 550 mg tablet Take 1 tablet (550 mg total) by mouth two (2) times daily with meals.   ??? Omega-3 Fatty Acids (FISH OIL) 1000 mg capsule Take 1 g by mouth daily.   ??? ONETOUCH DELICA FINE lancets USE 1 LANCET TWICE A DAY   ??? ONETOUCH ULTRA BLUE test strip USE 1 STRIP TWICE A DAY ??? vitamin E 100 unit capsule Take 100 Units by mouth daily.   ??? [DISCONTINUED] losartan 50 mg tablet Take 50 mg by mouth daily.   ??? [DISCONTINUED] LOSARTAN 50 mg tablet TAKE 1 TABLET DAILY (NEED CLINIC FOLLOW UP)   ??? [DISCONTINUED] metFORMIN 500 mg ER 24 hr tablet TAKE 2 TABLETS TWICE A DAY (NEED CLINIC FOLLOW UP).       Allergy:       No Known Allergies    Family history:         Family History   Problem Relation Age of Onset   ??? Alzheimer's disease Mother    ??? Heart disease Father        Social history:         Social History   Substance Use Topics   ??? Smoking status: Former Smoker   ??? Smokeless tobacco: Never Used      Comment: occ. cigar   ??? Alcohol use  Not on file       ROS:         Constitutional:   Denies fever       Eyes:  No redness or discharge         HEENT: No congestion no sore throat        Cardiovascular: No palpitations or chest pain       Respiratory:   No cough or SOB       Gastrointestinal:   No diarrhea constipation nausea or vomiting       Genitourinary:  No dysuria or increased frequency of urination       Integument:   No new skin lesions or rash       Neurologic:  No weakness instability frequent falls       Musculoskeletal:   No muscle pains or joint pain or swelling       Endocrine: No heat or cold intolerance polyuria or polydipsia       Psychiatric:    Denies anxiety and depression       Heme-Lymph:  No swollen glands       Allergic-Immunologic: No environmental allergies    PE:         BP 140/82  ~ Pulse 74  ~ Temp 36.3 ???C (97.4 ???F) (Oral)  ~ Resp 18  ~ Ht 5' 10'' (1.778 m)  ~ Wt (!) 234 lb (106.1 kg)  ~ BMI 33.58 kg/m???           Constitutional:              Appearance:   Well nourished, well developed, in no acute distress         Eyes: no injection or discharge         Ears:  External ear canals normal, TMs normal         Throat: No erythema or discharge          Neck: No adenopathy no thyromegaly         Respiratory:  Clear to auscultation, non labored breathing Cardiovascular:  Regular rate and rhythm, no murmur, gallop or rub, normal pedal pulses         Gastrointestinal:  Bowel sounds normal, non distended, non tender                                   No hepatosplenomegaly         Musculoskeletal:  No significant abnormalities seen         Neurological: light touch sensation intact in the feet with filament testing.         Skin and subcutaneous:  No significant lesions seen         Psychiatric:   Mood normal, affect appropriate    Clinical Support on 08/28/2017   Component Date Value Ref Range Status   ??? Sodium 08/28/2017 140  135 - 146 mmol/L Final   ??? Potassium 08/28/2017 4.5  3.6 - 5.3 mmol/L Final   ??? Chloride 08/28/2017 99  96 - 106 mmol/L Final   ??? Total CO2 08/28/2017 29  20 - 30 mmol/L Final   ??? Anion Gap 08/28/2017 12  8 - 19 Final   ??? Glucose 08/28/2017 110* 65 - 99 mg/dL Final   ??? GFR Estimate for Non-African Ameri* 08/28/2017 >89  See GFR Additional Information Final   ??? GFR Estimate for African American 08/28/2017 >  89  See GFR Additional Information Final   ??? GFR Additional Information 08/28/2017 See Comment   Final    GFR >89        Normal  GFR 60 - 89    Normal to mildly decreased  GFR 45 - 59    Mildly to moderately decreased  GFR 30 - 44    Moderately to severely decreased  GFR 15 - 29    Severely decreased  GFR <15        Kidney failure  The CKD-EPI equation was used to estimate GFR and assumes stable creatinine concentrations.  Results are in mL/min/1.73 square meters.   ??? Creatinine 08/28/2017 0.91  0.60 - 1.30 mg/dL Final   ??? Urea Nitrogen 08/28/2017 19  7 - 22 mg/dL Final   ??? Calcium 21/30/8657 10.1  8.6 - 10.4 mg/dL Final   ??? Total Protein 08/28/2017 7.3  6.1 - 8.2 g/dL Final   ??? Albumin 84/69/6295 4.8  3.9 - 5.0 g/dL Final   ??? Bilirubin,Total 08/28/2017 1.0  0.1 - 1.2 mg/dL Final   ??? Alkaline Phosphatase 08/28/2017 67  37 - 113 U/L Final   ??? Aspartate Aminotransferase 08/28/2017 18  13 - 47 U/L Final ??? Alanine Aminotransferase 08/28/2017 19  8 - 64 U/L Final   ??? Albumin/Creat Ratio 08/28/2017   <30.0 mcg/mg Final    Unable to calculate. Test result is???below detection???limit.   ??? Albumin, Urine 08/28/2017 <12.0  No Reference Range mg/L Final   ??? Creatinine,Random Urine 08/28/2017 199.1  No Reference Range mg/dL Final   ??? Hgb M8U - HPLC 08/28/2017 7.5* <5.7 % Final    For patients with diabetes, an A1c less than (<) or equal (=) to 7.0% is recommended for most patients, however the goal may be higher or lower depending on age and/or other medical problems.   For a diagnosis of diabetes, A1c greater than (>) or equal(=) to 6.5% indicates diabetes; values between 5.7% and 6.4% may indicate an increased risk of developing diabetes.   ??? Cholesterol 08/28/2017 131  See Comment mg/dL Final      The significance of total cholesterol depends on the values of LDL, HDL, triglycerides and the clinical context. A patient-provider discussion may be considered.       ??? Cholesterol,LDL,Calc 08/28/2017 70  <100 mg/dL Final    If LDL value falls outside of the designated range AND if  included in any of the following categories, a  patient-provider discussion is recommended.     Statin therapy is recommended for individuals:  1. with clinical atherosclerotic cardiovascular disease     irrespective of LDL levels;  2. with LDL > or = 190 mg/dL;  3. with diabetes, aged 40-75 years, with LDL between 70 and     189 mg/dL;  4. without any of the above but who have LDL between 70 and     189 mg/dL and an estimated 13-KGMW risk of     atherosclerotic cardiovascular disease > or = 7.5%     (consider statin therapy if estimated 10-year risk > or =     5.0%) (ACC/AHA 2013 Guidelines).   ??? Cholesterol, HDL 08/28/2017 40* >40 mg/dL Final    If HDL cholesterol level falls outside of the designated  range, a patient-provider discussion is recommended   ??? Triglycerides 08/28/2017 107  <150 mg/dL Final If Triglyceride level falls outside of the designated range,  a patient-provider discussion is recommended.     ??? Non-HDL,Chol,Calc  08/28/2017 91  <130 mg/dL Final    If Non-HDL cholesterol level falls outside of the designated  range, a patient-provider discussion is recommended.   ??? TSH 08/28/2017 1.9  0.3 - 4.7 mcIU/mL Final    TSH is normal, no further Thyroid tests were performed.  Ingestion of high levels of biotin in dietary supplements may lead to falsely decreased results.   ??? White Blood Cell Count 08/28/2017 8.12  4.16 - 9.95 x10E3/uL Final   ??? Red Blood Cell Count 08/28/2017 5.12  4.41 - 5.95 x10E6/uL Final   ??? Hemoglobin 08/28/2017 15.5  13.5 - 17.1 g/dL Final   ??? Hematocrit 08/28/2017 47.1  38.5 - 52.0 % Final   ??? Mean Corpuscular Volume 08/28/2017 92.0  79.3 - 98.6 fL Final   ??? Mean Corpuscular Hemoglobin 08/28/2017 30.3  26.4 - 33.4 pg Final   ??? MCH Concentration 08/28/2017 32.9  31.5 - 35.5 g/dL Final   ??? Red Cell Distribution Width-SD 08/28/2017 43.0  36.9 - 48.3 fL Final   ??? Red Cell Distribution Width-CV 08/28/2017 12.8  11.1 - 15.5 % Final   ??? Platelet Count, Auto 08/28/2017 277  143 - 398 x10E3/uL Final   ??? Mean Platelet Volume 08/28/2017 9.7  9.3 - 13.0 fL Final   ??? Nucleated RBC%, automated 08/28/2017 0.0  No Ref. Range % Final    Percent Reference Range Not Reported per accrediting agency   ??? Absolute Nucleated RBC Count 08/28/2017 0.00  0.00 - 0.00 x10E3/uL Final   ??? Neutrophil Abs (Prelim) 08/28/2017 4.82  See Absolute Neut Ct. x10E3/uL Final      This is a preliminary result.  If automated differential see Absolute Neut  Count or if manual differential see Absolute Neut Ct, Manual for final result.   ??? Neutrophil Percent, Auto 08/28/2017 59.4  No Ref. Range % Final    Percent reference range not reported per accrediting agency.     ??? Lymphocyte Percent, Auto 08/28/2017 27.8  No Ref. Range % Final    Percent reference range not reported per accrediting agency. ??? Monocyte Percent, Auto 08/28/2017 9.0  No Ref. Range % Final    Percent reference range not reported per accrediting agency.     ??? Eosinophil Percent, Auto 08/28/2017 2.7  No Ref. Range % Final    Percent reference range not reported per accrediting agency.     ??? Basophil Percent, Auto 08/28/2017 0.9  No Ref. Range % Final    Percent reference range not reported per accrediting agency.     ??? Immature Granulocytes% 08/28/2017 0.2  No Reference Range % Final   ??? Absolute Neut Count 08/28/2017 4.82  1.80 - 6.90 x10E3/uL Final   ??? Absolute Lymphocyte Count 08/28/2017 2.26  1.30 - 3.40 x10E3/uL Final   ??? Absolute Mono Count 08/28/2017 0.73  0.20 - 0.80 x10E3/uL Final   ??? Absolute Eos Count 08/28/2017 0.22  0.00 - 0.50 x10E3/uL Final   ??? Absolute Baso Count 08/28/2017 0.07  0.00 - 0.10 x10E3/uL Final   ??? Absolute Immature Gran Count 08/28/2017 0.02  0.00 - 0.04 x10E3/uL Final   ??? Urine Color 08/28/2017 Yellow    Final   ??? Specific Gravity 08/28/2017 1.025  1.005 - 1.030 Final   ??? pH,Urine 08/28/2017 6.0  5.0 - 8.0 Final   ??? Blood 08/28/2017 Negative  Negative Final   ??? Bilirubin 08/28/2017 Negative  Negative Final   ??? Ketones 08/28/2017 Trace* Negative Final   ??? Glucose 08/28/2017 Negative  Negative Final   ??? Protein 08/28/2017 Negative  Negative Final   ??? Leukocyte Esterase 08/28/2017 Negative  Negative Final   ??? Nitrite 08/28/2017 Negative  Negative Final   ??? RBC per uL 08/28/2017 7  0 - 11 cells/uL Final   ??? WBC per uL 08/28/2017 8  0 - 22 cells/uL Final   ??? RBC per HPF 08/28/2017 1  0 - 2 cells/HPF Final   ??? WBC per HPF 08/28/2017 2  0 - 4 cells/HPF Final   ??? Squamous Epi Cells 08/28/2017 0  0 - 17 cells/uL Final         Assessment:          Problem List Items Addressed This Visit        HCC Conditions    Type 2 diabetes mellitus without complication (HCC/RAF) - Primary    Relevant Medications    losartan 50 mg tablet    metFORMIN 500 mg ER 24 hr tablet    Other Relevant Orders    Referral to Endocrinology Referral to Ophthalmology    CBC & Auto Differential    Comprehensive Metabolic Panel    Hgb A1c    Albumin/Creat Ratio Ur    Urinalysis w/Reflex to Culture       Other    History of coronary artery stent placement    Relevant Orders    CBC & Auto Differential    Coronary artery disease involving native coronary artery without angina pectoris    Relevant Medications    losartan 50 mg tablet    Other Relevant Orders    CBC & Auto Differential    Essential hypertension    Relevant Medications    losartan 50 mg tablet    Other Relevant Orders    CBC & Auto Differential    Comprehensive Metabolic Panel    Mixed hyperlipidemia    Relevant Medications    losartan 50 mg tablet    Other Relevant Orders    CBC & Auto Differential    Comprehensive Metabolic Panel    Lipid Panel      Other Visit Diagnoses     Need for zoster vaccination        Relevant Orders    Zoster Vaccine (Shingrix) (order to administer in-clinic) [SINGLE ORDER, FOR ADMIN TODAY IN-OFFICE] (Completed)    Zoster Vaccine (Shingrix) (order to administer in-clinic) [SECOND IN SERIES, FUTURE 2-6 MONTHS]        Diabetes has improved control but not to goal.   Cholesterol well controlled. And hypertension controlled.   Obesity still a problem    Plan: continue RX and follow up with endocrinologist.  Continue with cardiologist and ophthalmologist  Diet exercise and weight loss. Discussed in detail.  Continue RX for hypertension.  Continue RX for hypercholesterolemia       Return in about 3 months (around 12/03/2017) for follow up with lab.    Patient Instructions       Weight Management: Healthy Eating  Food is your body???s fuel. You can???t live without it. The key is to give your body enough nutrients and energy without eating too much. Reading food labels can help you make healthy choices. Also, learn new eating habits to manage your weight. Nutrition labels are being redesigned by the FDA to emphasize the number of calories being consumed as well as the amount of more nutrients, such as added sugars, vitamin D, and potassium.     All the values on the label are based  on one serving. The serving size is the average portion. Remember to multiply the values on the label by the number of servings you eat.   Eat less fat  A gram of fat has almost???2.5 times???the calories of a gram of protein or carbohydrates.???Try to balance your food choices so that only 20% to???35% of your calories comes from total???fat. This means an average of 2??????to 3??????grams of fat for each 100 calories you eat.  Eat more fiber  High-fiber foods are digested more slowly than low-fiber foods, so you feel full longer.???Try to get at least 25 grams of fiber each day for a 2000 calorie diet. Foods high in fiber include:  ??? Vegetables and fruits  ??? Whole-grain or bran breads, pastas, and cereals  ??? Legumes (beans) and peas  As you start to eat more fiber, be sure to drink plenty of water to keep your digestive system working smoothly.  Tips  Do's and don'ts include:???  ??? Don???t skip meals. This often leads to overeating later on. It???s best to spread your eating throughout the day.  ??? Eat a variety of foods, not just a few favorites.  ??? If you find yourself eating when you???re not hungry, ask yourself why. Many of [place] eat when we???re bored, stressed, or just to be polite. Listen to your body. If you???re not hungry, get busy doing something else instead of eating.  ??? Eat slower, shooting for 20 to 30 minutes for each meal.???It takes 20 minutes for your stomach to tell your brain that it???s full. Slow eaters tend to eat less and are still satisfied, while fast eaters may tend to be overeaters.???  ??? Pay attention to what you eat. Don???t read or watch TV during your meal.  Date Last Reviewed: 07/05/2016  ??? 2000-2018 [employer] [place]. [address] [place], [place] [address]. All rights reserved. This information is not intended as a substitute for professional medical care. Always follow your healthcare professional's instructions.            The above recommendation were discussed with the patient.  The patient has all questions answered satisfactorily and is in agreement with this recommended plan of care.

## 2017-09-02 NOTE — Patient Instructions
Weight Management: Healthy Eating  Food is your body's fuel. You can't live without it. The key is to give your body enough nutrients and energy without eating too much. Reading food labels can help you make healthy choices. Also, learn new eating habits to manage your weight. Nutrition labels are being redesigned by the FDA to emphasize the number of calories being consumed as well as the amount of more nutrients, such as added sugars, vitamin D, and potassium.     All the values on the label are based on one serving. The serving size is the average portion. Remember to multiply the values on the label by the number of servings you eat.   Eat less fat  A gram of fat has almost2.5 timesthe calories of a gram of protein or carbohydrates.Try to balance your food choices so that only 20% to35% of your calories comes from totalfat. This means an average of 2to 3grams of fat for each 100 calories you eat.  Eat more fiber  High-fiber foods are digested more slowly than low-fiber foods, so you feel full longer.Try to get at least 25 grams of fiber each day for a 2000 calorie diet. Foods high in fiber include:   Vegetables and fruits   Whole-grain or bran breads, pastas, and cereals   Legumes (beans) and peas  As you start to eat more fiber, be sure to drink plenty of water to keep your digestive system working smoothly.  Tips  Do's and don'ts include:   Don't skip meals. This often leads to overeating later on. It's best to spread your eating throughout the day.   Eat a variety of foods, not just a few favorites.   If you find yourself eating when you're not hungry, ask yourself why. Many of us eat when we're bored, stressed, or just to be polite. Listen to your body. If you're not hungry, get busy doing something else instead of eating.   Eat slower, shooting for 20 to 30 minutes for each meal.It takes 20 minutes for your stomach to tell your brain that it's full. Slow eaters tend to eat less and are  still satisfied, while fast eaters may tend to be overeaters.   Pay attention to what you eat. Don't read or watch TV during your meal.  Date Last Reviewed: 07/05/2016   2000-2018 [employer] [address], [place] [address]. All rights reserved. This information is not intended as a substitute for professional medical care. Always follow your healthcare professional's instructions.

## 2017-09-07 ENCOUNTER — Other Ambulatory Visit: Payer: Self-pay | Admitting: Family Medicine

## 2017-09-07 MED ORDER — HYDROCHLOROTHIAZIDE 25 MG PO TABS
ORAL_TABLET | 0 refills | Status: AC
Start: 2017-09-07 — End: 2017-11-18

## 2017-09-07 MED FILL — EZETIMIBE 10 MG TABS: 10 | 90 days supply | Qty: 90 | Fill #0

## 2017-09-07 MED FILL — rOPINIRole HCL 0.5 MG TABS: 0.5 | 90 days supply | Qty: 90 | Fill #1

## 2017-09-07 MED FILL — SHIPPING COST: 1 days supply | Qty: 1 | Fill #9

## 2017-09-16 ENCOUNTER — Ambulatory Visit (INDEPENDENT_AMBULATORY_CARE_PROVIDER_SITE_OTHER): Payer: Medicare Other | Admitting: Family Medicine

## 2017-09-16 ENCOUNTER — Encounter: Payer: Self-pay | Admitting: Family Medicine

## 2017-09-16 ENCOUNTER — Other Ambulatory Visit: Payer: Self-pay

## 2017-09-16 ENCOUNTER — Telehealth: Payer: BLUE CROSS/BLUE SHIELD

## 2017-09-16 VITALS — BP 118/68 | HR 86 | Temp 98.1°F | Resp 14 | Ht 72.0 in | Wt 233.0 lb

## 2017-09-16 DIAGNOSIS — Z0000 Encounter for general adult medical examination without abnormal findings: Secondary | ICD-10-CM

## 2017-09-16 DIAGNOSIS — Z114 Encounter for screening for human immunodeficiency virus [HIV]: Secondary | ICD-10-CM

## 2017-09-16 DIAGNOSIS — R252 Cramp and spasm: Secondary | ICD-10-CM

## 2017-09-16 DIAGNOSIS — M51369 Other intervertebral disc degeneration, lumbar region without mention of lumbar back pain or lower extremity pain: Secondary | ICD-10-CM

## 2017-09-16 DIAGNOSIS — Z8042 Family history of malignant neoplasm of prostate: Secondary | ICD-10-CM | POA: Diagnosis not present

## 2017-09-16 DIAGNOSIS — R399 Unspecified symptoms and signs involving the genitourinary system: Secondary | ICD-10-CM

## 2017-09-16 DIAGNOSIS — E041 Nontoxic single thyroid nodule: Secondary | ICD-10-CM

## 2017-09-16 DIAGNOSIS — Z1159 Encounter for screening for other viral diseases: Secondary | ICD-10-CM

## 2017-09-16 DIAGNOSIS — M5136 Other intervertebral disc degeneration, lumbar region: Secondary | ICD-10-CM

## 2017-09-16 DIAGNOSIS — E66811 Obesity, class 1: Secondary | ICD-10-CM

## 2017-09-16 DIAGNOSIS — Z23 Encounter for immunization: Secondary | ICD-10-CM | POA: Diagnosis not present

## 2017-09-16 DIAGNOSIS — E669 Obesity, unspecified: Secondary | ICD-10-CM | POA: Diagnosis not present

## 2017-09-16 LAB — URINALYSIS, ROUTINE W REFLEX MICROSCOPIC
BILIRUBIN URINE: NEGATIVE
GLUCOSE, UA: NEGATIVE
HGB URINE DIPSTICK: NEGATIVE
Ketones, ur: NEGATIVE
Leukocytes, UA: NEGATIVE
Nitrite: NEGATIVE
PH: 6 (ref 5.0–8.0)
Protein, ur: NEGATIVE
Specific Gravity, Urine: 1.02 (ref 1.001–1.03)

## 2017-09-16 MED ORDER — ZOSTER VAC RECOMB ADJUVANTED 50 MCG/0.5ML IM SUSR: INTRAMUSCULAR | 1 refills | Status: DC

## 2017-09-16 NOTE — Telephone Encounter
Pt did not answer, left a voicemail to call back for their Endo referral. D.D.
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
## 2017-09-16 NOTE — Patient Instructions (Signed)
Our office will call you back with labs, with ABI test.  You should check with your pharmacy to run insurance and see if they will cover the Shingrix shot to prevent shingles.  We will call you with the referral to weightloss center/nutritionist at [facility].  It may take 1-2 weeks to process and schedule.  FitnessPal - count calories  Calorie Counting for Weight Loss Calories are units of energy. Your body needs a certain amount of calories from food to keep you going throughout the day. When you eat more calories than your body needs, your body stores the extra calories as fat. When you eat fewer calories than your body needs, your body burns fat to get the energy it needs. Calorie counting means keeping track of how many calories you eat and drink each day. Calorie counting can be helpful if you need to lose weight. If you make sure to eat fewer calories than your body needs, you should lose weight. Ask your health care provider what a healthy weight is for you. For calorie counting to work, you will need to eat the right number of calories in a day in order to lose a healthy amount of weight per week. A dietitian can help you determine how many calories you need in a day and will give you suggestions on how to reach your calorie goal.  A healthy amount of weight to lose per week is usually 1-2 lb (0.5-0.9 kg). This usually means that your daily calorie intake should be reduced by 500-750 calories.  Eating 1,200 - 1,500 calories per day can help most women lose weight.  Eating 1,500 - 1,800 calories per day can help most men lose weight.  What is my plan? My goal is to have __________ calories per day. If I have this many calories per day, I should lose around __________ pounds per week. What do I need to know about calorie counting? In order to meet your daily calorie goal, you will need to:  Find out how many calories are in each food you would like to eat. Try to do this before you  eat.  Decide how much of the food you plan to eat.  Write down what you ate and how many calories it had. Doing this is called keeping a food log.  To successfully lose weight, it is important to balance calorie counting with a healthy lifestyle that includes regular activity. Aim for 150 minutes of moderate exercise (such as walking) or 75 minutes of vigorous exercise (such as running) each week. Where do I find calorie information?  The number of calories in a food can be found on a Nutrition Facts label. If a food does not have a Nutrition Facts label, try to look up the calories online or ask your dietitian for help. Remember that calories are listed per serving. If you choose to have more than one serving of a food, you will have to multiply the calories per serving by the amount of servings you plan to eat. For example, the label on a package of bread might say that a serving size is 1 slice and that there are 90 calories in a serving. If you eat 1 slice, you will have eaten 90 calories. If you eat 2 slices, you will have eaten 180 calories. How do I keep a food log? Immediately after each meal, record the following information in your food log:  What you ate. Don't forget to include toppings, sauces, and other  extras on the food.  How much you ate. This can be measured in cups, ounces, or number of items.  How many calories each food and drink had.  The total number of calories in the meal.  Keep your food log near you, such as in a small notebook in your pocket, or use a mobile app or website. Some programs will calculate calories for you and show you how many calories you have left for the day to meet your goal. What are some calorie counting tips?  Use your calories on foods and drinks that will fill you up and not leave you hungry: ? Some examples of foods that fill you up are nuts and nut butters, vegetables, lean proteins, and high-fiber foods like whole grains. High-fiber  foods are foods with more than 5 g fiber per serving. ? Drinks such as sodas, specialty coffee drinks, alcohol, and juices have a lot of calories, yet do not fill you up.  Eat nutritious foods and avoid empty calories. Empty calories are calories you get from foods or beverages that do not have many vitamins or protein, such as candy, sweets, and soda. It is better to have a nutritious high-calorie food (such as an avocado) than a food with few nutrients (such as a bag of chips).  Know how many calories are in the foods you eat most often. This will help you calculate calorie counts faster.  Pay attention to calories in drinks. Low-calorie drinks include water and unsweetened drinks.  Pay attention to nutrition labels for "low fat" or "fat free" foods. These foods sometimes have the same amount of calories or more calories than the full fat versions. They also often have added sugar, starch, or salt, to make up for flavor that was removed with the fat.  Find a way of tracking calories that works for you. Get creative. Try different apps or programs if writing down calories does not work for you. What are some portion control tips?  Know how many calories are in a serving. This will help you know how many servings of a certain food you can have.  Use a measuring cup to measure serving sizes. You could also try weighing out portions on a kitchen scale. With time, you will be able to estimate serving sizes for some foods.  Take some time to put servings of different foods on your favorite plates, bowls, and cups so you know what a serving looks like.  Try not to eat straight from a bag or box. Doing this can lead to overeating. Put the amount you would like to eat in a cup or on a plate to make sure you are eating the right portion.  Use smaller plates, glasses, and bowls to prevent overeating.  Try not to multitask (for example, watch TV or use your computer) while eating. If it is time to eat,  sit down at a table and enjoy your food. This will help you to know when you are full. It will also help you to be aware of what you are eating and how much you are eating. What are tips for following this plan? Reading food labels  Check the calorie count compared to the serving size. The serving size may be smaller than what you are used to eating.  Check the source of the calories. Make sure the food you are eating is high in vitamins and protein and low in saturated and trans fats. Shopping  Read nutrition labels while  you shop. This will help you make healthy decisions before you decide to purchase your food.  Make a grocery list and stick to it. Cooking  Try to cook your favorite foods in a healthier way. For example, try baking instead of frying.  Use low-fat dairy products. Meal planning  Use more fruits and vegetables. Half of your plate should be fruits and vegetables.  Include lean proteins like poultry and fish. How do I count calories when eating out?  Ask for smaller portion sizes.  Consider sharing an entree and sides instead of getting your own entree.  If you get your own entree, eat only half. Ask for a box at the beginning of your meal and put the rest of your entree in it so you are not tempted to eat it.  If calories are listed on the menu, choose the lower calorie options.  Choose dishes that include vegetables, fruits, whole grains, low-fat dairy products, and lean protein.  Choose items that are boiled, broiled, grilled, or steamed. Stay away from items that are buttered, battered, fried, or served with cream sauce. Items labeled "crispy" are usually fried, unless stated otherwise.  Choose water, low-fat milk, unsweetened iced tea, or other drinks without added sugar. If you want an alcoholic beverage, choose a lower calorie option such as a glass of wine or light beer.  Ask for dressings, sauces, and syrups on the side. These are usually high in  calories, so you should limit the amount you eat.  If you want a salad, choose a garden salad and ask for grilled meats. Avoid extra toppings like bacon, cheese, or fried items. Ask for the dressing on the side, or ask for olive oil and vinegar or lemon to use as dressing.  Estimate how many servings of a food you are given. For example, a serving of cooked rice is  cup or about the size of half a baseball. Knowing serving sizes will help you be aware of how much food you are eating at restaurants. The list below tells you how big or small some common portion sizes are based on everyday objects: ? 1 oz-4 stacked dice. ? 3 oz-1 deck of cards. ? 1 tsp-1 die. ? 1 Tbsp- a ping-pong ball. ? 2 Tbsp-1 ping-pong ball. ?  cup- baseball. ? 1 cup-1 baseball. Summary  Calorie counting means keeping track of how many calories you eat and drink each day. If you eat fewer calories than your body needs, you should lose weight.  A healthy amount of weight to lose per week is usually 1-2 lb (0.5-0.9 kg). This usually means reducing your daily calorie intake by 500-750 calories.  The number of calories in a food can be found on a Nutrition Facts label. If a food does not have a Nutrition Facts label, try to look up the calories online or ask your dietitian for help.  Use your calories on foods and drinks that will fill you up, and not on foods and drinks that will leave you hungry.  Use smaller plates, glasses, and bowls to prevent overeating. This information is not intended to replace advice given to you by your health care provider. Make sure you discuss any questions you have with your health care provider. Document Released: 03/23/2005 Document Revised: 02/21/2016 Document Reviewed: 02/21/2016 Elsevier Interactive Patient Education  [employer].

## 2017-09-17 LAB — HIV ANTIBODY (ROUTINE TESTING W REFLEX): HIV: NONREACTIVE

## 2017-09-17 LAB — PSA: PSA: 0.4 ng/mL (ref <=4.0)

## 2017-09-18 LAB — TEST AUTHORIZATION

## 2017-09-18 LAB — CBC WITH DIFFERENTIAL/PLATELET
BASOS PCT: 1 %
Basophils Absolute: 51 cells/uL (ref 0–200)
Eosinophils Absolute: 102 cells/uL (ref 15–500)
Eosinophils Relative: 2 %
HCT: 46.1 % (ref 38.5–50.0)
Hemoglobin: 15.9 g/dL (ref 13.2–17.1)
Lymphs Abs: 1775 cells/uL (ref 850–3900)
MCH: 31.2 pg (ref 27.0–33.0)
MCHC: 34.5 g/dL (ref 32.0–36.0)
MCV: 90.6 fL (ref 80.0–100.0)
MONOS PCT: 11.8 %
MPV: 9.8 fL (ref 7.5–12.5)
NEUTROS ABS: 2570 {cells}/uL (ref 1500–7800)
Neutrophils Relative %: 50.4 %
PLATELETS: 254 10*3/uL (ref 140–400)
RBC: 5.09 10*6/uL (ref 4.20–5.80)
RDW: 12.5 % (ref 11.0–15.0)
TOTAL LYMPHOCYTE: 34.8 %
WBC mixed population: 602 cells/uL (ref 200–950)
WBC: 5.1 10*3/uL (ref 3.8–10.8)

## 2017-09-18 LAB — HEPATITIS C ANTIBODY
HEP C AB: NONREACTIVE
SIGNAL TO CUT-OFF: 0.01 (ref <1.00)

## 2017-09-18 LAB — COMPLETE METABOLIC PANEL WITH GFR
AG RATIO: 2.3 (calc) (ref 1.0–2.5)
ALKALINE PHOSPHATASE (APISO): 60 U/L (ref 40–115)
ALT: 36 U/L (ref 9–46)
AST: 28 U/L (ref 10–35)
Albumin: 4.8 g/dL (ref 3.6–5.1)
BUN: 22 mg/dL (ref 7–25)
CO2: 31 mmol/L (ref 20–32)
CREATININE: 1.11 mg/dL (ref 0.70–1.33)
Calcium: 9.9 mg/dL (ref 8.6–10.3)
Chloride: 104 mmol/L (ref 98–110)
GFR, Est African American: 84 mL/min/{1.73_m2} (ref >=60)
GFR, Est Non African American: 73 mL/min/{1.73_m2} (ref >=60)
GLOBULIN: 2.1 g/dL (ref 1.9–3.7)
Glucose, Bld: 88 mg/dL (ref 65–99)
POTASSIUM: 5.2 mmol/L (ref 3.5–5.3)
Sodium: 143 mmol/L (ref 135–146)
Total Bilirubin: 0.5 mg/dL (ref 0.2–1.2)
Total Protein: 6.9 g/dL (ref 6.1–8.1)

## 2017-09-18 LAB — LIPID PANEL
CHOLESTEROL: 183 mg/dL (ref <200)
HDL: 49 mg/dL (ref >40)
LDL CHOLESTEROL (CALC): 117 mg/dL — AB
Non-HDL Cholesterol (Calc): 134 mg/dL (calc) — ABNORMAL HIGH (ref <130)
TRIGLYCERIDES: 75 mg/dL (ref <150)
Total CHOL/HDL Ratio: 3.7 (calc) (ref <5.0)

## 2017-09-18 LAB — TSH: TSH: 1.33 mIU/L (ref 0.40–4.50)

## 2017-09-18 LAB — HEMOGLOBIN A1C W/OUT EAG: Hgb A1c MFr Bld: 5.2 % of total Hgb (ref <5.7)

## 2017-09-20 ENCOUNTER — Ambulatory Visit (HOSPITAL_COMMUNITY)
Admission: RE | Admit: 2017-09-20 | Discharge: 2017-09-20 | Disposition: A | Discharge status: Home / Self Care | Payer: No Typology Code available for payment source | Source: Ambulatory Visit | Attending: Family Medicine | Admitting: Family Medicine

## 2017-09-20 DIAGNOSIS — Z8739 Personal history of other diseases of the musculoskeletal system and connective tissue: Secondary | ICD-10-CM | POA: Diagnosis not present

## 2017-09-20 DIAGNOSIS — R252 Cramp and spasm: Secondary | ICD-10-CM | POA: Insufficient documentation

## 2017-09-20 MED FILL — ZOLPIDEM TARTRATE 10 MG TAB: 10 | 30 days supply | Qty: 30 | Fill #1

## 2017-09-20 MED FILL — SHIPPING COST: 1 days supply | Qty: 1 | Fill #10

## 2017-09-21 NOTE — Progress Notes (Signed)
Please notify patient that the ABI test results were normal, with symmetrical results and wave forms (pulses), this means the arterial blood flow to your legs is optimal and normal.

## 2017-09-21 NOTE — Progress Notes (Signed)
Please notify pt regarding his routine lab work from recent CPE:  All lab work looks excellent except for abnormal lipid panel.  Total cholesterol and LDL are higher than prior labs (last done in 05/2015).  Pt should continue zetia and increase aerobic exercise (5 d x 30 min) and work on healthy diet (increase fiber, decrease saturated fats/transfats, choose lean meats or at least leaner cuts of meat.)

## 2017-09-22 ENCOUNTER — Telehealth: Payer: Self-pay | Admitting: *Deleted

## 2017-09-22 NOTE — Addendum Note (Signed)
Addended by: Delsa Grana on: 09/22/2017 02:28 PM   Modules accepted: Orders

## 2017-09-22 NOTE — Telephone Encounter (Signed)
Can you please tell him its done and they should call him.  Thank you

## 2017-09-22 NOTE — Telephone Encounter (Signed)
Received call from patient.   Reports that he is to be referred to weight loss specialist.   States that he would like to have referral placed for Dennard Nip, MD, with [facility].  Please advise.

## 2017-09-28 MED FILL — SHIPPING COST: 1 days supply | Qty: 1 | Fill #11

## 2017-09-28 MED FILL — LYRICA 150 MG CAPSULE: 150 | 30 days supply | Qty: 90 | Fill #2

## 2017-10-06 ENCOUNTER — Encounter: Payer: Medicare Other | Admitting: Family Medicine

## 2017-10-11 ENCOUNTER — Encounter: Payer: Self-pay | Admitting: Family Medicine

## 2017-10-13 ENCOUNTER — Encounter: Payer: Self-pay | Admitting: Family Medicine

## 2017-10-13 ENCOUNTER — Ambulatory Visit (INDEPENDENT_AMBULATORY_CARE_PROVIDER_SITE_OTHER): Payer: Medicare Other | Admitting: Family Medicine

## 2017-10-13 ENCOUNTER — Other Ambulatory Visit: Payer: Self-pay | Admitting: Family Medicine

## 2017-10-13 VITALS — BP 118/80 | HR 60 | Temp 98.1°F | Resp 16 | Ht 72.0 in | Wt 238.0 lb

## 2017-10-13 DIAGNOSIS — R7989 Other specified abnormal findings of blood chemistry: Secondary | ICD-10-CM

## 2017-10-13 DIAGNOSIS — R5383 Other fatigue: Secondary | ICD-10-CM

## 2017-10-13 DIAGNOSIS — N401 Enlarged prostate with lower urinary tract symptoms: Secondary | ICD-10-CM

## 2017-10-13 DIAGNOSIS — R399 Unspecified symptoms and signs involving the genitourinary system: Secondary | ICD-10-CM

## 2017-10-13 NOTE — Progress Notes (Signed)
Patient ID: Derek [name], male    DOB: [birth year], 57 y.o.   MRN: [id number]  PCP: Alycia Rossetti, MD  Chief Complaint  Patient presents with  . Fatigue    Subjective:   Derek [name] is a 57 y.o. male, presents to clinic with CC of generalized fatigue x 2-3 years.  Sx started after back surgery and chronic pain began with having to stop working.  He also notes a pmhx of low testosterone in the past with tx, but the side effects were concerning to him so he stopped treatment.  That was approximately 5 years ago with his PCP.  He would like his testosterone levels checked again.  Currently he is still not working, stays home all day, has chronic back pain, feel down most of the day, occasionally will go to "the garage and drinks 6 beers until [he] feels better,"     He denies new weakness, tingling.  He has no CP, SOB, near syncope, weight change, skin changes, hair changes.  Bowels and urination have not changed.    CPE was recently done with normal A1C, TSH, no anemia.  Patient Active Problem List   Diagnosis Date Noted  . Thyroid cyst 05/17/2015  . History of colonic polyps   . Diverticulosis of colon without hemorrhage   . RLQ abdominal pain 07/13/2014  . Hematochezia 07/13/2014  . Routine general medical examination at a health care facility 01/17/2014  . Pain of right thumb 09/13/2013  . Obesity 07/18/2013  . Hand joint pain 07/18/2013  . Right knee pain 03/18/2013  . Injury, other and unspecified, finger 01/04/2013  . Insomnia 01/04/2013  . Benign prostatic hyperplasia 09/05/2012  . Sleep apnea 09/05/2012  . Fluid collection at surgical site 04/20/2012  . Degenerative disc disease, lumbar 04/20/2012  . Hypertriglyceridemia 04/20/2012  . GERD (gastroesophageal reflux disease) 04/20/2012  . Constipation 03/17/2011     Prior to Admission medications   Medication Sig Start Date End Date Taking? Authorizing Provider  aspirin 325 MG tablet Take 325 mg by mouth  daily.   Yes [provider]  cyclobenzaprine (FLEXERIL) 5 MG tablet Take 5 mg by mouth. 01/30/15  Yes [provider]  ezetimibe (ZETIA) 10 MG tablet TAKE 1 TABLET BY MOUTH ONCE DAILY 09/07/17  Yes [place], Modena Nunnery, MD  HYDROcodone-acetaminophen [facility]) 10-325 MG tablet Take 1 tablet by mouth every 6 (six) hours as needed.   Yes [provider]  linaclotide Rolan Lipa) 145 MCG CAPS capsule take 1 capsule by mouth once daily ON AN EMPTY STOMACH 08/02/17  Yes [place], Modena Nunnery, MD  LYRICA 150 MG capsule Take 150 mg by mouth 2 (two) times daily.  06/05/14  Yes [provider]  magnesium gluconate (MAGONATE) 500 MG tablet Take 500 mg by mouth 2 (two) times daily.   Yes [provider]  Melatonin 5 MG CHEW Chew by mouth.   Yes [provider]  Multiple Vitamin (MULTIVITAMIN) tablet Take 1 tablet by mouth daily.     Yes [provider]  pantoprazole (PROTONIX) 40 MG tablet Take 1 tablet (40 mg total) by mouth 2 (two) times daily. 10/16/16  Yes [place], Modena Nunnery, MD  rOPINIRole (REQUIP) 0.5 MG tablet Take 1 tablet (0.5 mg total) by mouth at bedtime. 09/07/16  Yes [place], Modena Nunnery, MD  zolpidem (AMBIEN) 10 MG tablet TAKE 1 TABLET BY MOUTH AT BEDTIME AS NEEDED 08/09/17  Yes [place], Modena Nunnery, MD  Zoster Vaccine Adjuvanted [facility]) injection  One injection now ( 0.5 mL) and repeat in 2 months 09/16/17  Yes Delsa Grana, PA-C     No Known Allergies   Family History  Problem Relation Age of Onset  . Prostate cancer Father   . COPD Father   . Heart disease Father   . Heart disease Mother   . Hypertension Mother   . Hyperlipidemia Mother   . Stroke Mother   . Colon polyps Brother 56  . Diabetes Paternal Aunt   . Diabetes Paternal Grandfather   . Colon cancer Neg Hx   . Anesthesia problems Neg Hx   . Hypotension Neg Hx   . Malignant hyperthermia Neg Hx   . Pseudochol deficiency Neg Hx      Social History   Socioeconomic History  .  Marital status: Married    Spouse name: Not on file  . Number of children: 2  . Years of education: Not on file  . Highest education level: Not on file  Occupational History  . Occupation: disability    Comment: back     Employer: [employer]  . Financial resource strain: Not on file  . Food insecurity:    Worry: Not on file    Inability: Not on file  . Transportation needs:    Medical: Not on file    Non-medical: Not on file  Tobacco Use  . Smoking status: Former Smoker    Packs/day: 2.00    Years: 30.00    Pack years: 60.00    Last attempt to quit: 09/15/2007    Years since quitting: 10.0  . Smokeless tobacco: Current User    Types: Snuff, Chew  Substance and Sexual Activity  . Alcohol use: Yes    Comment: occasional use of alcohol (beer)  . Drug use: No  . Sexual activity: Yes  Lifestyle  . Physical activity:    Days per week: Not on file    Minutes per session: Not on file  . Stress: Not on file  Relationships  . Social connections:    Talks on phone: Not on file    Gets together: Not on file    Attends religious service: Not on file    Active member of club or organization: Not on file    Attends meetings of clubs or organizations: Not on file    Relationship status: Not on file  . Intimate partner violence:    Fear of current or ex partner: Not on file    Emotionally abused: Not on file    Physically abused: Not on file    Forced sexual activity: Not on file  Other Topics Concern  . Not on file  Social History Narrative  . Not on file     Review of Systems  Constitutional: Negative.  Negative for activity change, appetite change, fatigue and unexpected weight change.  HENT: Negative.   Eyes: Negative.   Respiratory: Negative.  Negative for shortness of breath.   Cardiovascular: Negative.  Negative for chest pain, palpitations and leg swelling.  Gastrointestinal: Negative.  Negative for abdominal pain and blood in stool.    Endocrine: Negative.   Genitourinary: Positive for difficulty urinating. Negative for decreased urine volume, testicular pain and urgency.  Musculoskeletal: Positive for back pain.  Skin: Negative.  Negative for color change and pallor.  Allergic/Immunologic: Negative.   Neurological: Negative.  Negative for dizziness, tremors, syncope, weakness, light-headedness, numbness and headaches.  Hematological: Negative.   Psychiatric/Behavioral: Positive for dysphoric  mood. Negative for agitation, confusion, decreased concentration, self-injury and suicidal ideas. The patient is not nervous/anxious and is not hyperactive.   All other systems reviewed and are negative.      Objective:    Vitals:   10/13/17 0857  BP: 118/80  Pulse: 60  Resp: 16  Temp: 98.1 F (36.7 C)  TempSrc: Oral  SpO2: 96%  Weight: 238 lb (108 kg)  Height: 6' (1.829 m)      Physical Exam  Constitutional: He is oriented to person, place, and time. He appears well-developed and well-nourished.  Non-toxic appearance. He does not appear ill. No distress.  Pt tired appearing  HENT:  Head: Normocephalic and atraumatic.  Right Ear: Tympanic membrane, external ear and ear canal normal.  Left Ear: Tympanic membrane, external ear and ear canal normal.  Nose: Nose normal. No mucosal edema or rhinorrhea. Right sinus exhibits no maxillary sinus tenderness and no frontal sinus tenderness. Left sinus exhibits no maxillary sinus tenderness and no frontal sinus tenderness.  Mouth/Throat: Uvula is midline and oropharynx is clear and moist. No trismus in the jaw. No uvula swelling. No oropharyngeal exudate, posterior oropharyngeal edema or posterior oropharyngeal erythema.  Eyes: Pupils are equal, round, and reactive to light. Conjunctivae, EOM and lids are normal.  Neck: Trachea normal, normal range of motion, full passive range of motion without pain and phonation normal. Neck supple. No JVD present. No tracheal tenderness  present. No tracheal deviation present. Thyromegaly present. No thyroid mass present.  General fullness to anterior neck soft tissue  Cardiovascular: Normal rate, regular rhythm, normal heart sounds, intact distal pulses and normal pulses. PMI is not displaced. Exam reveals no gallop and no friction rub.  No murmur heard. Pulses:      Radial pulses are 2+ on the right side, and 2+ on the left side.       Dorsalis pedis pulses are 2+ on the right side, and 2+ on the left side.       Posterior tibial pulses are 2+ on the right side, and 2+ on the left side.  No LE edema No thinning skin or hair loss to b/l LE  Pulmonary/Chest: Effort normal and breath sounds normal. No accessory muscle usage or stridor. No respiratory distress. He has no decreased breath sounds. He has no wheezes. He has no rhonchi. He has no rales.  Abdominal: Soft. Normal appearance and bowel sounds are normal. He exhibits no distension and no mass. There is no tenderness. There is no rigidity, no rebound and no guarding.  Genitourinary:  Genitourinary Comments: deferred  Musculoskeletal: Normal range of motion. He exhibits no edema.  Lymphadenopathy:    He has no cervical adenopathy.  Neurological: He is alert and oriented to person, place, and time. He has normal strength. He displays no atrophy and no tremor. No sensory deficit. He exhibits normal muscle tone. Coordination and gait normal.  Skin: Skin is warm, dry and intact. Capillary refill takes less than 2 seconds. No rash noted. He is not diaphoretic. No cyanosis. No pallor. Nails show no clubbing.  Psychiatric: His speech is normal and behavior is normal. Judgment normal. Thought content is not paranoid and not delusional. Cognition and memory are normal. He exhibits a depressed mood. He expresses no homicidal and no suicidal ideation. He expresses no suicidal plans and no homicidal plans.  Nursing note and vitals reviewed.         Assessment & Plan:       ICD-10-CM   1.  Fatigue, unspecified type R53.83 Testosterone    Testosterone  2. Lower urinary tract symptoms (LUTS) R39.9 Ambulatory referral to Urology  3. Low testosterone in male R79.89 Ambulatory referral to Urology    Testosterone  4. Benign prostatic hyperplasia with lower urinary tract symptoms, symptom details unspecified N40.1 Ambulatory referral to Urology     Fatigue, vague sx, pt concerned for low T, lab done today.  Pt did not mention any of these sx at recent CPE with me, and he reports sx have been ongoing for several years.  Will eval for testosterone.  TSH normal recently.   No anemia.  No other focal sx.  May have some psych component with chronic pain, no longer working.  Did alcohol screening because he mentioned drinking 6 beers to feel better, but AUDIT score is 6, reassuring.  His PHQ 9 is 10, no SI, HI, AVH.    We discussed obtaining his testosterone level today and he will follow-up with his PCP.  He has mentioned to me more than once his lower urinary tract symptoms, last PSA normal, but low T and BPH may need specialist so referred to urology.  He went to them in the past but cannot recall physicians name.    The pt and I also discussed some possibility of depression being part of his fatigue which is common with chronic pain and his life changes, with not working.  We will r/o low T first and would address this more fully if testosterone was normal.  For now encouraged to find scheduled activities and tasks that don't exacerbate his pain.  Delsa Grana, PA-C 10/13/17 10:36 AM

## 2017-10-13 NOTE — Telephone Encounter (Signed)
Patient seen in office

## 2017-10-13 NOTE — Patient Instructions (Signed)
Return in one week to do a second testosterone test

## 2017-10-14 LAB — TESTOSTERONE: TESTOSTERONE: 268 ng/dL (ref 250–827)

## 2017-10-18 MED FILL — ZOLPIDEM TARTRATE 10 MG TAB: 10 | 30 days supply | Qty: 30 | Fill #2

## 2017-10-18 MED FILL — SHIPPING COST: 1 days supply | Qty: 1 | Fill #12

## 2017-10-19 ENCOUNTER — Ambulatory Visit (INDEPENDENT_AMBULATORY_CARE_PROVIDER_SITE_OTHER): Payer: Medicare Other | Admitting: Family Medicine

## 2017-10-19 ENCOUNTER — Encounter: Payer: Self-pay | Admitting: Family Medicine

## 2017-10-19 ENCOUNTER — Other Ambulatory Visit: Payer: Self-pay

## 2017-10-19 VITALS — BP 128/62 | HR 64 | Temp 98.1°F | Resp 14 | Ht 72.0 in | Wt 240.0 lb

## 2017-10-19 DIAGNOSIS — G4733 Obstructive sleep apnea (adult) (pediatric): Secondary | ICD-10-CM | POA: Diagnosis not present

## 2017-10-19 DIAGNOSIS — F5101 Primary insomnia: Secondary | ICD-10-CM

## 2017-10-19 DIAGNOSIS — R7989 Other specified abnormal findings of blood chemistry: Secondary | ICD-10-CM

## 2017-10-19 DIAGNOSIS — F4321 Adjustment disorder with depressed mood: Secondary | ICD-10-CM

## 2017-10-19 MED ORDER — EZETIMIBE 10 MG PO TABS: 10.0000 mg | ORAL_TABLET | Freq: Every day | ORAL | 0 refills | Status: DC

## 2017-10-19 MED ORDER — ZOLPIDEM TARTRATE 10 MG PO TABS: ORAL_TABLET | ORAL | 2 refills | Status: DC

## 2017-10-19 MED ORDER — TRAZODONE HCL 50 MG PO TABS: 25.0000 mg | ORAL_TABLET | Freq: Every evening | ORAL | 3 refills | Status: DC | PRN

## 2017-10-19 MED FILL — SHIPPING COST: 1 days supply | Qty: 1 | Fill #13

## 2017-10-19 MED FILL — traZODone HCL 50 MG TABS: 50 | 30 days supply | Qty: 30 | Fill #0

## 2017-10-19 NOTE — Assessment & Plan Note (Signed)
Fatigue is multifactorial in general.  While he does have him mild low normal testosterone levels it is not causing all his symptoms.  I think that his sleep apnea is not treated appropriately as he cannot even tolerate his mask.  His last sleep study was 5 years ago I will send him back to neurology where he has an initial sleep study and have him reevaluate and repeat his screening adjust his settings.  He also has underlying depression insomnia from his chronic back pain.  We will try him on trazodone he is going to take 50 mg when he goes to bed if he still cannot fall asleep he may take a half a Ambien which is 5 mg 2 hours later.  He can still follow-up with urology for his erectile dysfunction low testosterone

## 2017-10-19 NOTE — Progress Notes (Signed)
Subjective:    Patient ID: Derek [name], male    DOB: [birth year], 59 y.o.   MRN: [id number]  Patient presents for Follow-up    Always fatigued, doesn't sleep well, constant pain from his back.  Has not wearing CPAP through the night, often pulls it off. States he just received a new mask a few months ago but it still doesn't help, last sleep study in 2014   He [facility] to [facility] he is frustrated that he cannot do things around the house with things he enjoys because of his chronic back pain.  He often gets in the bed at 7 PM because the only relief he gets this just to lay down.  He has leg cramps which he now takes magnesium for he is stopped using the Requip and the Flexeril regularly.  For his chronic pain he still takes the hydrocodone and the Lyrica.  He does not sleep well in general and still has difficulty falling asleep he is taking Ambien as well as 6-8 melatonin at night.  He admits that he has some depression symptoms he just feels sad most days.  Most of this is centered around his chronic pain.  Fortunately nothing else can be done for his back.  Reviewed his recent labs from his physical as well as his testosterone levels.  He is already been referred to urology  Review Of Systems:  GEN- + fatigue, fever, weight loss,weakness, recent illness HEENT- denies eye drainage, change in vision, nasal discharge, CVS- denies chest pain, palpitations RESP- denies SOB, cough, wheeze ABD- denies N/V, change in stools, abd pain GU- denies dysuria, hematuria, dribbling, incontinence MSK- +joint pain, muscle aches, injury Neuro- denies headache, dizziness, syncope, seizure activity       Objective:    BP 128/62   Pulse 64   Temp 98.1 F (36.7 C) (Oral)   Resp 14   Ht 6' (1.829 m)   Wt 240 lb (108.9 kg)   SpO2 96%   BMI 32.55 kg/m  GEN- NAD, alert and oriented x3 HEENT- PERRL, EOMI, non injected sclera, pink conjunctiva, MMM, oropharynx  clear Neck- Supple, no thyromegaly CVS- RRR, no murmur RESP-CTAB Psych- depressed affect, not anxious appearing , no SI, phq 9 score 11  Pulses- Radial, DP- 2+        Assessment & Plan:      Problem List Items Addressed This Visit      Unprioritized   Insomnia   Low testosterone    Referred to urology       Situational depression   Relevant Medications   traZODone (DESYREL) 50 MG tablet   Sleep apnea - Primary    Fatigue is multifactorial in general.  While he does have him mild low normal testosterone levels it is not causing all his symptoms.  I think that his sleep apnea is not treated appropriately as he cannot even tolerate his mask.  His last sleep study was 5 years ago I will send him back to neurology where he has an initial sleep study and have him reevaluate and repeat his screening adjust his settings.  He also has underlying depression insomnia from his chronic back pain.  We will try him on trazodone he is going to take 50 mg when he goes to bed if he still cannot fall asleep he may take a half a Ambien which is 5 mg 2 hours later.  He can still follow-up with urology for  his erectile dysfunction low testosterone      Relevant Orders   Ambulatory referral to Neurology      Note: This dictation was prepared with Dragon dictation along with smaller phrase technology. Any transcriptional errors that result from this process are unintentional.

## 2017-10-19 NOTE — Assessment & Plan Note (Signed)
Referred to urology

## 2017-10-19 NOTE — Patient Instructions (Signed)
Take trazodone at 7pm Later if not able to sleep 2 hours later, you can take 1/2 tablet of the Ambien Stop the melatonin Referral for new sleep study/ CPAP Machine F/U pending results

## 2017-10-26 MED FILL — LYRICA 150 MG CAPSULE: 150 | 30 days supply | Qty: 90 | Fill #0

## 2017-10-26 MED FILL — SHIPPING COST: 1 days supply | Qty: 1 | Fill #14

## 2017-10-26 MED FILL — QUETIAPINE FUMARATE 25 MG T: 25 | 30 days supply | Qty: 60 | Fill #0

## 2017-11-10 ENCOUNTER — Telehealth: Payer: BLUE CROSS/BLUE SHIELD

## 2017-11-10 ENCOUNTER — Ambulatory Visit

## 2017-11-10 DIAGNOSIS — N5201 Erectile dysfunction due to arterial insufficiency: Secondary | ICD-10-CM | POA: Diagnosis not present

## 2017-11-10 DIAGNOSIS — N5082 Scrotal pain: Secondary | ICD-10-CM | POA: Diagnosis not present

## 2017-11-11 NOTE — Telephone Encounter
Call Back Request    MD:  Dr. York CeriseMichaelson    Reason for call back: Pt states that all medication has to be sent over to [facility] due to insurance changing pharmacy coverage.    Any Symptoms:  []  Yes  [x]  No      ? If yes, what symptoms are you experiencing:    o Duration of symptoms (how long):    o Have you taken medication for symptoms (OTC or Rx):      Patient or caller has been notified of the 24-48 hour turnaround time.

## 2017-11-11 NOTE — Telephone Encounter
No such pharmacy found in system.  Pt will need to provide us with phone and fax number for this [facility] (please correct if not spelled correctly)

## 2017-11-12 MED FILL — traZODone HCL 50 MG TABS: 50 | 30 days supply | Qty: 30 | Fill #1

## 2017-11-12 MED FILL — SHIPPING COST: 1 days supply | Qty: 1 | Fill #15

## 2017-11-12 NOTE — Telephone Encounter
LM: Asked pt to call back. Need to verify Px. Did not leave detailed message.

## 2017-11-15 ENCOUNTER — Other Ambulatory Visit: Payer: Self-pay | Admitting: Family Medicine

## 2017-11-15 MED FILL — ZOLPIDEM TARTRATE 10 MG TAB: 10 | 30 days supply | Qty: 30 | Fill #0

## 2017-11-15 MED FILL — SHIPPING COST: 1 days supply | Qty: 1 | Fill #16

## 2017-11-15 NOTE — Telephone Encounter (Signed)
Ok to refill??  Last office visit/ refill 10/19/2017.

## 2017-11-17 MED ORDER — ATORVASTATIN CALCIUM 80 MG PO TABS
ORAL_TABLET | 3 refills | Status: AC
Start: 2017-11-17 — End: 2018-06-23

## 2017-11-17 MED ORDER — LOSARTAN POTASSIUM 50 MG PO TABS
50 mg | ORAL_TABLET | Freq: Every day | ORAL | 3 refills | Status: AC
Start: 2017-11-17 — End: 2018-06-23

## 2017-11-17 MED ORDER — METFORMIN HCL ER 500 MG PO TB24
ORAL_TABLET | 3 refills | Status: AC
Start: 2017-11-17 — End: 2018-06-23

## 2017-11-17 MED ORDER — HYDROCHLOROTHIAZIDE 25 MG PO TABS
ORAL_TABLET | 0 refills | Status: AC
Start: 2017-11-17 — End: 2018-02-04

## 2017-11-18 DIAGNOSIS — N5082 Scrotal pain: Secondary | ICD-10-CM | POA: Diagnosis not present

## 2017-11-23 MED FILL — PREGABALIN 150 MG CAPS: 150 | 30 days supply | Qty: 90 | Fill #1

## 2017-11-23 MED FILL — LINZESS 145 MCG CAPSULE: 145 | 90 days supply | Qty: 90 | Fill #2

## 2017-11-23 MED FILL — SHIPPING COST: 1 days supply | Qty: 1 | Fill #17

## 2017-11-27 ENCOUNTER — Institutional Professional Consult (permissible substitution)

## 2017-11-27 DIAGNOSIS — Z955 Presence of coronary angioplasty implant and graft: Secondary | ICD-10-CM

## 2017-11-27 DIAGNOSIS — I10 Essential (primary) hypertension: Secondary | ICD-10-CM

## 2017-11-27 DIAGNOSIS — E782 Mixed hyperlipidemia: Secondary | ICD-10-CM

## 2017-11-27 DIAGNOSIS — I2510 Atherosclerotic heart disease of native coronary artery without angina pectoris: Secondary | ICD-10-CM

## 2017-11-27 DIAGNOSIS — E119 Type 2 diabetes mellitus without complications: Secondary | ICD-10-CM

## 2017-11-28 ENCOUNTER — Encounter: Payer: Self-pay | Admitting: Family Medicine

## 2017-11-28 LAB — Comprehensive Metabolic Panel
CHLORIDE: 104 mmol/L (ref 96–106)
SODIUM: 143 mmol/L (ref 135–146)

## 2017-11-28 LAB — Differential Automated: ABSOLUTE IMMATURE GRAN COUNT: 0.02 10*3/uL (ref 0.00–0.04)

## 2017-11-28 LAB — UA,Microscopic: WBCS: 3 {cells}/uL (ref 0–22)

## 2017-11-28 LAB — CBC: NUCLEATED RBC%, AUTOMATED: 0 (ref 4.41–5.95)

## 2017-11-28 LAB — Lipid Panel: CHOLESTEROL: 158 mg/dL (ref >40–<130)

## 2017-11-28 LAB — UA,Dipstick: SPECIFIC GRAVITY: 1.014 (ref 1.005–1.030)

## 2017-11-28 LAB — Albumin/Creatinine Ratio,Urine: ALBUMIN,URINE: 24 mg/L (ref <30.0)

## 2017-11-30 LAB — Hgb A1c: HGB A1C - HPLC: 8.5 — ABNORMAL HIGH (ref <5.7)

## 2017-11-30 MED FILL — EZETIMIBE 10 MG TABLET: 10 | 90 days supply | Qty: 90 | Fill #0

## 2017-11-30 MED FILL — rOPINIRole HCL 0.5 MG TABS: 0.5 | 90 days supply | Qty: 90 | Fill #0

## 2017-11-30 MED FILL — SHIPPING COST: 1 days supply | Qty: 1 | Fill #18

## 2017-12-03 ENCOUNTER — Other Ambulatory Visit: Payer: Self-pay | Admitting: Family Medicine

## 2017-12-03 ENCOUNTER — Ambulatory Visit

## 2017-12-03 DIAGNOSIS — Z23 Encounter for immunization: Secondary | ICD-10-CM

## 2017-12-03 DIAGNOSIS — Z1159 Encounter for screening for other viral diseases: Secondary | ICD-10-CM

## 2017-12-03 DIAGNOSIS — E119 Type 2 diabetes mellitus without complications: Secondary | ICD-10-CM

## 2017-12-03 DIAGNOSIS — Z114 Encounter for screening for human immunodeficiency virus [HIV]: Secondary | ICD-10-CM

## 2017-12-03 MED FILL — SHIPPING COST: 1 days supply | Qty: 1 | Fill #19

## 2017-12-03 MED FILL — PANTOPRAZOLE SOD DR 40 MG T: 40 | 90 days supply | Qty: 180 | Fill #0

## 2017-12-03 NOTE — Patient Instructions
Losing Weight for Heart Health  Excess weight is a major risk factor for heart disease. Losing weight has many benefits including lowering your blood pressure, improving your cholesterol level, and decreasing your risk for diseases such as diabetes and heart disease. It may help keep your arteries open so that your heart can get the oxygen-rich blood it needs. All in all, losing weight makes you healthier.     Exercise with a friend. When activity is fun, you're more likely to stick with it.   Calories and weight loss   Calories are the fuel your body burns for energy. You get the calories you need from the food you eat. For healthy weight loss, women should eat at least 1,200 calories a day, men at least 1,500.   When you eat more calories than you need, your body stores the extra calories as fat. One pound of fat equals 3,500 calories.   To lose weight, try to reduce your total calorie intake by 500 calories. To do this, eat 250 calories less each day. Add activity to burn the other 250 calories. Walking2.5miles burns about 250 calories. Other more intense activities can burn more calories in the time you spend doing them, such as swimming and running. It is important to understand that reducing calorie intake is much more effective at weight loss than is exercise.   Eat a variety of healthy foods to get the nutrients you need.  Tips for losing weight   Drink 8 to 10 glasses of water a day.   Don't skip meals. Instead, eat smaller portions.   Eat your meals earlier in the day.   Cut out sugary drinks such as soda and fruit juices.   Make your later meals lighter than your earlier meals.   Brisk activity is best  Brisk activity gets your heart pumping faster and it makes it healthier. It's also a great way to burn calories. In fact, your body may keep burning calories for hours after you stop a brisk activity:   Begin by walking 10 minutes most days.   Add more time and speed to your walk. Build up  as you feel able.   Aim for 3 to 4 sessions of aerobic exercise a week. Each session should last about 40 minutes and include moderate to vigorous physical activity.   The most important part of the activity is that you break a sweat. This indicates your heart is working hard enough to burn fat.  Date Last Reviewed: 09/05/2014   2000-2018 [employer] [address], [place] [address]. All rights reserved. This information is not intended as a substitute for professional medical care. Always follow your healthcare professional's instructions.

## 2017-12-03 NOTE — Progress Notes
PROGRESS NOTE    Patient:  Keith Cabrera    Medical record number:  [id number]    Date of birth:  Jun 18, 1960        Chief complaint:    Chief Complaint   Patient presents with   ??? Follow-up     Type 2 DM              Keith Cabrera is a 57 y.o. male        Who presents with the following history of present illness:        Past medical history:           Past Medical History:   Diagnosis Date   ??? Hypertension 04/28/2006    HYPERTENSION NOS       Past surgical history:         Past Surgical History:   Procedure Laterality Date   ??? COLONOSCOPY N/A 02/21/2016   ??? CORONARY ANGIOPLASTY  03/13/13       Medications:         Medications that the patient states to be currently taking   Medication Sig   ??? aspirin 81 mg EC tablet Take 81 mg by mouth daily.   ??? atorvastatin 80 mg tablet TAKE 1 TABLET DAILY.   ??? clopidogrel (PLAVIX) 75 mg tablet Take 75 mg by mouth daily .     ??? cyanocobalamin (CYANOCOBALAMIN) 1000 mcg tablet Take 1,000 mcg by mouth daily.   ??? Efinaconazole (JUBLIA) 10 % SOLN external solution Apply topically daily as needed (toenail fungus).   ??? hydroCHLOROthiazide 25 mg tablet TAKE 1 TABLET DAILY.   ??? losartan 50 mg tablet Take 1 tablet (50 mg total) by mouth daily.   ??? metFORMIN (GLUCOPHAGE XR) 500 mg PO ER 24 hr tablet TAKE 2 TABLETS TWICE A DAY (NEED CLINIC FOLLOW UP).   ??? naproxen 550 mg tablet Take 1 tablet (550 mg total) by mouth two (2) times daily with meals.   ??? Omega-3 Fatty Acids (FISH OIL) 1000 mg capsule Take 1 g by mouth daily.   ??? ONETOUCH DELICA FINE lancets USE 1 LANCET TWICE A DAY   ??? ONETOUCH ULTRA BLUE test strip USE 1 STRIP TWICE A DAY   ??? vitamin E 100 unit capsule Take 100 Units by mouth daily.       Allergy:       No Known Allergies    Family history:         Family History   Problem Relation Age of Onset   ??? Alzheimer's disease Mother    ??? Heart disease Father        Social history:         Social History   Substance Use Topics   ??? Smoking status: Former Smoker ??? Smokeless tobacco: Never Used      Comment: occ. cigar   ??? Alcohol use Not on file       ROS:         Constitutional:   Denies fever       Eyes:  No redness or discharge         HEENT: No congestion no sore throat        Cardiovascular: No palpitations or chest pain       Respiratory:   No cough or SOB       Gastrointestinal:   No diarrhea constipation nausea or vomiting       Genitourinary:  No dysuria or increased  frequency of urination       Integument:   No new skin lesions or rash       Neurologic:  No weakness instability frequent falls       Musculoskeletal:   No muscle pains or joint pain or swelling       Endocrine: No heat or cold intolerance polyuria or polydipsia       Psychiatric:    Denies anxiety and depression       Heme-Lymph:  No swollen glands       Allergic-Immunologic: No environmental allergies    PE:         BP 135/79  ~ Pulse 74  ~ Temp 36.6 ???C (97.9 ???F) (Oral)  ~ Resp 16  ~ Ht 5' 10'' (1.778 m)  ~ Wt (!) 234 lb 9.6 oz (106.4 kg)  ~ SpO2 96%  ~ BMI 33.66 kg/m???           Constitutional:              Appearance:   Well nourished, well developed, in no acute distress         Eyes: no injection or discharge         Ears:  External ear canals normal, TMs normal         Throat: No erythema or discharge          Neck: No adenopathy no thyromegaly         Respiratory:  Clear to auscultation, non labored breathing         Cardiovascular:  Regular rate and rhythm, no murmur, gallop or rub, pedal pulses normal         Gastrointestinal:  Bowel sounds normal, non distended, non tender                                   No hepatosplenomegaly         Musculoskeletal:  No significant abnormalities seen         Neurological: normal light touch sensation in feet to filament testing.         Skin and subcutaneous:  No significant lesions seen         Psychiatric:   Mood normal, affect appropriate    Clinical Support on 11/27/2017   Component Date Value Ref Range Status ??? Sodium 11/27/2017 143  135 - 146 mmol/L Final   ??? Potassium 11/27/2017 4.4  3.6 - 5.3 mmol/L Final   ??? Chloride 11/27/2017 104  96 - 106 mmol/L Final   ??? Total CO2 11/27/2017 23  20 - 30 mmol/L Final   ??? Anion Gap 11/27/2017 16  8 - 19 Final   ??? Glucose 11/27/2017 123* 65 - 99 mg/dL Final   ??? GFR Estimate for Non-African Ameri* 11/27/2017 >89  See GFR Additional Information Final   ??? GFR Estimate for African American 11/27/2017 >89  See GFR Additional Information Final   ??? GFR Additional Information 11/27/2017 See Comment   Final    GFR >89        Normal  GFR 60 - 89    Normal to mildly decreased  GFR 45 - 59    Mildly to moderately decreased  GFR 30 - 44    Moderately to severely decreased  GFR 15 - 29    Severely decreased  GFR <15        Kidney failure  The CKD-EPI equation was used to estimate GFR and assumes stable creatinine concentrations.  Results are in mL/min/1.73 square meters.   ??? Creatinine 11/27/2017 0.89  0.60 - 1.30 mg/dL Final   ??? Urea Nitrogen 11/27/2017 10  7 - 22 mg/dL Final   ??? Calcium 16/01/9603 9.6  8.6 - 10.4 mg/dL Final   ??? Total Protein 11/27/2017 7.4  6.1 - 8.2 g/dL Final   ??? Albumin 54/12/8117 4.5  3.9 - 5.0 g/dL Final   ??? Bilirubin,Total 11/27/2017 0.6  0.1 - 1.2 mg/dL Final   ??? Alkaline Phosphatase 11/27/2017 65  37 - 113 U/L Final   ??? Aspartate Aminotransferase 11/27/2017 27  13 - 47 U/L Final   ??? Alanine Aminotransferase 11/27/2017 25  8 - 64 U/L Final   ??? Cholesterol 11/27/2017 158  See Comment mg/dL Final      The significance of total cholesterol depends on the values of LDL, HDL, triglycerides and the clinical context. A patient-provider discussion may be considered.       ??? Cholesterol,LDL,Calc 11/27/2017 77  <100 mg/dL Final    If LDL value falls outside of the designated range AND if  included in any of the following categories, a  patient-provider discussion is recommended.     Statin therapy is recommended for individuals: 1. with clinical atherosclerotic cardiovascular disease     irrespective of LDL levels;  2. with LDL > or = 190 mg/dL;  3. with diabetes, aged 40-75 years, with LDL between 70 and     189 mg/dL;  4. without any of the above but who have LDL between 70 and     189 mg/dL and an estimated 14-NWGN risk of     atherosclerotic cardiovascular disease > or = 7.5%     (consider statin therapy if estimated 10-year risk > or =     5.0%) (ACC/AHA 2013 Guidelines).   ??? Cholesterol, HDL 11/27/2017 46  >40 mg/dL Final    If HDL cholesterol level falls outside of the designated  range, a patient-provider discussion is recommended   ??? Triglycerides 11/27/2017 173* <150 mg/dL Final      If Triglyceride level falls outside of the designated range,  a patient-provider discussion is recommended.     ??? Non-HDL,Chol,Calc 11/27/2017 112  <130 mg/dL Final    If Non-HDL cholesterol level falls outside of the designated  range, a patient-provider discussion is recommended.   ??? Hgb A1c - HPLC 11/27/2017 8.5* <5.7 % Final    For patients with diabetes, an A1c less than (<) or equal (=) to 7.0% is recommended for most patients, however the goal may be higher or lower depending on age and/or other medical problems.   For a diagnosis of diabetes, A1c greater than (>) or equal(=) to 6.5% indicates diabetes; values between 5.7% and 6.4% may indicate an increased risk of developing diabetes.   ??? Albumin/Creat Ratio 11/27/2017 18.5  <30.0 mcg/mg Final   ??? Albumin, Urine 11/27/2017 24.0  No Reference Range mg/L Final   ??? Creatinine,Random Urine 11/27/2017 130.0  No Reference Range mg/dL Final   ??? White Blood Cell Count 11/27/2017 7.99  4.16 - 9.95 x10E3/uL Final   ??? Red Blood Cell Count 11/27/2017 4.84  4.41 - 5.95 x10E6/uL Final   ??? Hemoglobin 11/27/2017 14.7  13.5 - 17.1 g/dL Final   ??? Hematocrit 11/27/2017 45.0  38.5 - 52.0 % Final   ??? Mean Corpuscular Volume 11/27/2017 93.0  79.3 - 98.6 fL Final ??? Mean  Corpuscular Hemoglobin 11/27/2017 30.4  26.4 - 33.4 pg Final   ??? MCH Concentration 11/27/2017 32.7  31.5 - 35.5 g/dL Final   ??? Red Cell Distribution Width-SD 11/27/2017 46.8  36.9 - 48.3 fL Final   ??? Red Cell Distribution Width-CV 11/27/2017 13.7  11.1 - 15.5 % Final   ??? Platelet Count, Auto 11/27/2017 288  143 - 398 x10E3/uL Final   ??? Mean Platelet Volume 11/27/2017 9.6  9.3 - 13.0 fL Final   ??? Nucleated RBC%, automated 11/27/2017 0.0  No Ref. Range % Final    Percent Reference Range Not Reported per accrediting agency   ??? Absolute Nucleated RBC Count 11/27/2017 0.00  0.00 - 0.00 x10E3/uL Final   ??? Neutrophil Abs (Prelim) 11/27/2017 4.08  See Absolute Neut Ct. x10E3/uL Final      This is a preliminary result.  If automated differential see Absolute Neut  Count or if manual differential see Absolute Neut Ct, Manual for final result.   ??? Neutrophil Percent, Auto 11/27/2017 51.0  No Ref. Range % Final    Percent reference range not reported per accrediting agency.     ??? Lymphocyte Percent, Auto 11/27/2017 37.0  No Ref. Range % Final    Percent reference range not reported per accrediting agency.     ??? Monocyte Percent, Auto 11/27/2017 8.1  No Ref. Range % Final    Percent reference range not reported per accrediting agency.     ??? Eosinophil Percent, Auto 11/27/2017 2.8  No Ref. Range % Final    Percent reference range not reported per accrediting agency.     ??? Basophil Percent, Auto 11/27/2017 0.8  No Ref. Range % Final    Percent reference range not reported per accrediting agency.     ??? Immature Granulocytes% 11/27/2017 0.3  No Reference Range % Final   ??? Absolute Neut Count 11/27/2017 4.08  1.80 - 6.90 x10E3/uL Final   ??? Absolute Lymphocyte Count 11/27/2017 2.96  1.30 - 3.40 x10E3/uL Final   ??? Absolute Mono Count 11/27/2017 0.65  0.20 - 0.80 x10E3/uL Final   ??? Absolute Eos Count 11/27/2017 0.22  0.00 - 0.50 x10E3/uL Final   ??? Absolute Baso Count 11/27/2017 0.06  0.00 - 0.10 x10E3/uL Final ??? Absolute Immature Gran Count 11/27/2017 0.02  0.00 - 0.04 x10E3/uL Final   ??? Urine Color 11/27/2017 Yellow    Final   ??? Specific Gravity 11/27/2017 1.014  1.005 - 1.030 Final   ??? pH,Urine 11/27/2017 6.0  5.0 - 8.0 Final   ??? Blood 11/27/2017 Negative  Negative Final   ??? Bilirubin 11/27/2017 Negative  Negative Final   ??? Ketones 11/27/2017 Negative  Negative Final   ??? Glucose 11/27/2017 Negative  Negative Final   ??? Protein 11/27/2017 Negative  Negative Final   ??? Leukocyte Esterase 11/27/2017 Negative  Negative Final   ??? Nitrite 11/27/2017 Negative  Negative Final   ??? RBC per uL 11/27/2017 1  0 - 11 cells/uL Final   ??? WBC per uL 11/27/2017 3  0 - 22 cells/uL Final   ??? RBC per HPF 11/27/2017 0  0 - 2 cells/HPF Final   ??? WBC per HPF 11/27/2017 1  0 - 4 cells/HPF Final   ??? Squamous Epi Cells 11/27/2017 0  0 - 17 cells/uL Final         Assessment:          Problem List Items Addressed This Visit        HCC Conditions  Type 2 diabetes mellitus without complication (HCC/RAF) - Primary    Relevant Orders    Referral to Cardiovascular Disease    Referral to Ophthalmology    Comprehensive Metabolic Panel    CBC & Auto Differential    Hgb A1c    Albumin/Creat Ratio Ur    Urinalysis w/Reflex to Culture       Other    History of coronary artery stent placement    Relevant Orders    Referral to Cardiovascular Disease    Comprehensive Metabolic Panel    CBC & Auto Differential    Essential hypertension    Relevant Orders    Referral to Cardiovascular Disease    Comprehensive Metabolic Panel    CBC & Auto Differential    Urinalysis w/Reflex to Culture    Mixed hyperlipidemia    Relevant Orders    Referral to Cardiovascular Disease    Comprehensive Metabolic Panel    CBC & Auto Differential    Lipid Panel      Other Visit Diagnoses     Encounter for screening for HIV        Relevant Orders    HIV-1/2 Ag/Ab 4th Generation with Reflex Confirmation    Need for hepatitis C screening test        Relevant Orders HCV Antibody Screen with Reflex to Quantitative PCR/Genotype            Plan: weight loss diet and exercise discussed. Follow up with endocrinologist as referred is recommended. Follow up with the cardiologist. Continue RX for cholesterol hypertension and diabetes.          Return in about 3 months (around 03/05/2018).    Patient Instructions       Losing Weight for Heart Health  Excess weight is a major risk factor for heart disease. Losing weight has many benefits including lowering your blood pressure, improving your cholesterol level, and decreasing your risk for diseases such as diabetes and heart disease. It may help keep your arteries open so that your heart can get the oxygen-rich blood it needs. All in all, losing weight makes you healthier.     Exercise with a friend. When activity is fun, you're more likely to stick with it.   Calories and weight loss  ??? Calories are the fuel your body burns for energy. You get the calories you need from the food you eat. For healthy weight loss, women should eat at least 1,200 calories a day, men at least 1,500.  ??? When you eat more calories than you need, your body stores the extra calories as fat. One pound of fat equals 3,500 calories.  ??? To lose weight, try to reduce your total calorie intake by 500 calories. To do this, eat 250 calories less each day. Add activity to burn the other 250 calories. Walking???2.5???miles burns about 250 calories. Other more intense activities can burn more calories in the time you spend doing them, such as swimming and running. It is important to understand that reducing calorie intake is much more effective at weight loss than is exercise.  ??? Eat a variety of healthy foods to get the nutrients you need.  Tips for losing weight  ??? Drink 8 to 10 glasses of water a day.  ??? Don???t skip meals. Instead, eat smaller portions.  ??? Eat your meals earlier in the day.  ??? Cut out sugary drinks such as soda and fruit juices. ??? Make your later meals lighter than your earlier  meals.   Brisk activity is best  Brisk activity gets your heart pumping faster and it makes it healthier. It???s also a great way to burn calories. In fact, your body may keep burning calories for hours after you stop a brisk activity:  ??? Begin by walking 10 minutes most days.  ??? Add more time and speed to your walk. Build up as you feel able.  ??? Aim for 3 to 4 sessions of aerobic exercise a week. Each session should last about 40 minutes and include moderate to vigorous physical activity.  ??? The most important part of the activity is that you break a sweat. This indicates your heart is working hard enough to burn fat.  Date Last Reviewed: 09/05/2014  ??? 2000-2018 [employer]. [address], [place], [place] [address]. All rights reserved. This information is not intended as a substitute for professional medical care. Always follow your healthcare professional's instructions.            The above recommendation were discussed with the patient.  The patient has all questions answered satisfactorily and is in agreement with this recommended plan of care.

## 2017-12-08 ENCOUNTER — Encounter (INDEPENDENT_AMBULATORY_CARE_PROVIDER_SITE_OTHER): Payer: Medicare Other

## 2017-12-09 ENCOUNTER — Other Ambulatory Visit: Payer: Self-pay | Admitting: Orthopaedic Surgery

## 2017-12-09 DIAGNOSIS — G894 Chronic pain syndrome: Secondary | ICD-10-CM

## 2017-12-15 MED FILL — SHIPPING COST: 1 days supply | Qty: 1 | Fill #20

## 2017-12-15 MED FILL — ZOLPIDEM TARTRATE 10 MG TAB: 10 | 30 days supply | Qty: 30 | Fill #1

## 2017-12-22 ENCOUNTER — Encounter (INDEPENDENT_AMBULATORY_CARE_PROVIDER_SITE_OTHER): Payer: Self-pay | Admitting: Family Medicine

## 2017-12-22 ENCOUNTER — Ambulatory Visit (INDEPENDENT_AMBULATORY_CARE_PROVIDER_SITE_OTHER): Payer: No Typology Code available for payment source | Admitting: Family Medicine

## 2017-12-22 VITALS — BP 107/70 | HR 55 | Temp 97.6°F | Ht 71.0 in | Wt 234.0 lb

## 2017-12-22 DIAGNOSIS — E785 Hyperlipidemia, unspecified: Secondary | ICD-10-CM | POA: Diagnosis not present

## 2017-12-22 DIAGNOSIS — E669 Obesity, unspecified: Secondary | ICD-10-CM | POA: Diagnosis not present

## 2017-12-22 DIAGNOSIS — Z6832 Body mass index (BMI) 32.0-32.9, adult: Secondary | ICD-10-CM | POA: Diagnosis not present

## 2017-12-22 DIAGNOSIS — R5383 Other fatigue: Secondary | ICD-10-CM

## 2017-12-22 DIAGNOSIS — R739 Hyperglycemia, unspecified: Secondary | ICD-10-CM

## 2017-12-22 DIAGNOSIS — R0602 Shortness of breath: Secondary | ICD-10-CM | POA: Diagnosis not present

## 2017-12-22 DIAGNOSIS — E66811 Obesity, class 1: Secondary | ICD-10-CM

## 2017-12-22 DIAGNOSIS — Z0289 Encounter for other administrative examinations: Secondary | ICD-10-CM

## 2017-12-22 DIAGNOSIS — E559 Vitamin D deficiency, unspecified: Secondary | ICD-10-CM

## 2017-12-22 DIAGNOSIS — Z1331 Encounter for screening for depression: Secondary | ICD-10-CM | POA: Diagnosis not present

## 2017-12-23 ENCOUNTER — Other Ambulatory Visit: Payer: Self-pay

## 2017-12-23 LAB — COMPREHENSIVE METABOLIC PANEL
ALK PHOS: 64 IU/L (ref 39–117)
ALT: 39 IU/L (ref 0–44)
AST: 31 IU/L (ref 0–40)
Albumin/Globulin Ratio: 2.5 — ABNORMAL HIGH (ref 1.2–2.2)
Albumin: 5.3 g/dL (ref 3.5–5.5)
BUN / CREAT RATIO: 15 (ref 9–20)
BUN: 16 mg/dL (ref 6–24)
Bilirubin Total: 0.4 mg/dL (ref 0.0–1.2)
CHLORIDE: 99 mmol/L (ref 96–106)
CO2: 27 mmol/L (ref 20–29)
CREATININE: 1.05 mg/dL (ref 0.76–1.27)
Calcium: 10.2 mg/dL (ref 8.7–10.2)
GFR calc Af Amer: 90 mL/min/{1.73_m2} (ref >59)
GFR calc non Af Amer: 78 mL/min/{1.73_m2} (ref >59)
GLUCOSE: 89 mg/dL (ref 65–99)
Globulin, Total: 2.1 g/dL (ref 1.5–4.5)
Potassium: 4.6 mmol/L (ref 3.5–5.2)
Sodium: 144 mmol/L (ref 134–144)
Total Protein: 7.4 g/dL (ref 6.0–8.5)

## 2017-12-23 LAB — CBC WITH DIFFERENTIAL/PLATELET
BASOS ABS: 0.1 10*3/uL (ref 0.0–0.2)
Basos: 1 %
EOS (ABSOLUTE): 0.1 10*3/uL (ref 0.0–0.4)
Eos: 3 %
HEMOGLOBIN: 15.8 g/dL (ref 13.0–17.7)
Hematocrit: 46.8 % (ref 37.5–51.0)
Immature Grans (Abs): 0 10*3/uL (ref 0.0–0.1)
Immature Granulocytes: 0 %
LYMPHS ABS: 1.9 10*3/uL (ref 0.7–3.1)
Lymphs: 33 %
MCH: 31.2 pg (ref 26.6–33.0)
MCHC: 33.8 g/dL (ref 31.5–35.7)
MCV: 93 fL (ref 79–97)
MONOCYTES: 9 %
MONOS ABS: 0.5 10*3/uL (ref 0.1–0.9)
NEUTROS ABS: 3.1 10*3/uL (ref 1.4–7.0)
Neutrophils: 54 %
PLATELETS: 267 10*3/uL (ref 150–450)
RBC: 5.06 x10E6/uL (ref 4.14–5.80)
RDW: 12.4 % (ref 12.3–15.4)
WBC: 5.6 10*3/uL (ref 3.4–10.8)

## 2017-12-23 LAB — LIPID PANEL
CHOLESTEROL TOTAL: 163 mg/dL (ref 100–199)
Chol/HDL Ratio: 5.1 ratio — ABNORMAL HIGH (ref 0.0–5.0)
HDL: 32 mg/dL — ABNORMAL LOW (ref >39)
LDL CALC: 97 mg/dL (ref 0–99)
TRIGLYCERIDES: 168 mg/dL — AB (ref 0–149)
VLDL Cholesterol Cal: 34 mg/dL (ref 5–40)

## 2017-12-23 LAB — TSH: TSH: 0.687 u[IU]/mL (ref 0.450–4.500)

## 2017-12-23 LAB — VITAMIN D 25 HYDROXY (VIT D DEFICIENCY, FRACTURES): Vit D, 25-Hydroxy: 45.6 ng/mL (ref 30.0–100.0)

## 2017-12-23 LAB — T4, FREE: FREE T4: 1.28 ng/dL (ref 0.82–1.77)

## 2017-12-23 LAB — HEMOGLOBIN A1C
ESTIMATED AVERAGE GLUCOSE: 103 mg/dL
HEMOGLOBIN A1C: 5.2 % (ref 4.8–5.6)

## 2017-12-23 LAB — T3: T3 TOTAL: 99 ng/dL (ref 71–180)

## 2017-12-23 LAB — INSULIN, RANDOM: INSULIN: 14.1 u[IU]/mL (ref 2.6–24.9)

## 2017-12-23 MED FILL — SHIPPING COST: 1 days supply | Qty: 1 | Fill #21

## 2017-12-23 MED FILL — PREGABALIN 150 MG CAPS: 150 | 30 days supply | Qty: 90 | Fill #2
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
## 2018-01-03 ENCOUNTER — Ambulatory Visit
Admission: RE | Admit: 2018-01-03 | Discharge: 2018-01-03 | Disposition: A | Discharge status: Home / Self Care | Payer: Self-pay | Source: Ambulatory Visit | Attending: Orthopaedic Surgery | Admitting: Orthopaedic Surgery

## 2018-01-03 DIAGNOSIS — G894 Chronic pain syndrome: Secondary | ICD-10-CM

## 2018-01-03 DIAGNOSIS — M5136 Other intervertebral disc degeneration, lumbar region: Secondary | ICD-10-CM | POA: Diagnosis not present

## 2018-01-03 MED ORDER — DIAZEPAM 5 MG PO TABS: 10.0000 mg | ORAL_TABLET | Freq: Once | ORAL | Status: DC

## 2018-01-03 MED ORDER — IOPAMIDOL (ISOVUE-M 200) INJECTION 41%
15.0000 mL | Freq: Once | INTRAMUSCULAR | Status: AC
  Administered 2018-01-03: 15 mL via INTRATHECAL

## 2018-01-03 MED ORDER — MEPERIDINE HCL 100 MG/ML IJ SOLN
75.0000 mg | Freq: Once | INTRAMUSCULAR | Status: AC
  Administered 2018-01-03: 75 mg via INTRAMUSCULAR

## 2018-01-03 MED ORDER — ONDANSETRON HCL 4 MG/2ML IJ SOLN: 4.0000 mg | Freq: Four times a day (QID) | INTRAMUSCULAR | Status: DC | PRN

## 2018-01-03 MED ORDER — ONDANSETRON HCL 4 MG/2ML IJ SOLN
4.0000 mg | Freq: Once | INTRAMUSCULAR | Status: AC
  Administered 2018-01-03: 4 mg via INTRAMUSCULAR

## 2018-01-03 NOTE — Discharge Instructions (Signed)

## 2018-01-06 ENCOUNTER — Ambulatory Visit (INDEPENDENT_AMBULATORY_CARE_PROVIDER_SITE_OTHER): Payer: No Typology Code available for payment source | Admitting: Family Medicine

## 2018-01-06 ENCOUNTER — Ambulatory Visit (INDEPENDENT_AMBULATORY_CARE_PROVIDER_SITE_OTHER): Payer: Medicare Other

## 2018-01-06 VITALS — BP 118/78 | HR 89 | Temp 98.0°F | Ht 71.0 in | Wt 236.0 lb

## 2018-01-06 DIAGNOSIS — E669 Obesity, unspecified: Secondary | ICD-10-CM

## 2018-01-06 DIAGNOSIS — E559 Vitamin D deficiency, unspecified: Secondary | ICD-10-CM

## 2018-01-06 DIAGNOSIS — Z6833 Body mass index (BMI) 33.0-33.9, adult: Secondary | ICD-10-CM

## 2018-01-06 DIAGNOSIS — E8881 Metabolic syndrome: Secondary | ICD-10-CM | POA: Diagnosis not present

## 2018-01-06 DIAGNOSIS — Z9189 Other specified personal risk factors, not elsewhere classified: Secondary | ICD-10-CM

## 2018-01-06 DIAGNOSIS — Z23 Encounter for immunization: Secondary | ICD-10-CM | POA: Diagnosis not present

## 2018-01-06 MED FILL — NORTRIPTYLINE HCL 10 MG CAP: 10 | 30 days supply | Qty: 90 | Fill #0

## 2018-01-06 MED FILL — SHIPPING COST: 1 days supply | Qty: 1 | Fill #22

## 2018-01-06 NOTE — Progress Notes (Signed)
Patient was in office and received flu vaccine in his right deltoid.Patient tolerated well

## 2018-01-10 ENCOUNTER — Telehealth (INDEPENDENT_AMBULATORY_CARE_PROVIDER_SITE_OTHER): Payer: Self-pay | Admitting: Family Medicine

## 2018-01-10 MED ORDER — VITAMIN D-3 125 MCG (5000 UT) PO TABS: 1.0000 | ORAL_TABLET | Freq: Every day | ORAL | 0 refills | Status: DC

## 2018-01-10 NOTE — Telephone Encounter (Signed)
Patient called, stated that Dr. Adair Patter was going to call in Vitamin D last week to [facility] on [address] in [place], but he needs it called to [facility].  [phone]
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
## 2018-01-11 ENCOUNTER — Other Ambulatory Visit (INDEPENDENT_AMBULATORY_CARE_PROVIDER_SITE_OTHER): Payer: Self-pay

## 2018-01-11 DIAGNOSIS — E559 Vitamin D deficiency, unspecified: Secondary | ICD-10-CM

## 2018-01-11 MED ORDER — VITAMIN D-3 125 MCG (5000 UT) PO TABS: 1.0000 | ORAL_TABLET | Freq: Every day | ORAL | 0 refills | Status: DC

## 2018-01-11 NOTE — Telephone Encounter (Signed)
Cancelled the [facility] prescription and sent it to [facility] at the request of the patient. April, [place]

## 2018-01-14 MED FILL — SHIPPING COST: 1 days supply | Qty: 1 | Fill #23

## 2018-01-14 MED FILL — ZOLPIDEM TARTRATE 10 MG TAB: 10 | 30 days supply | Qty: 30 | Fill #2

## 2018-01-17 ENCOUNTER — Other Ambulatory Visit: Payer: Self-pay | Admitting: Family Medicine

## 2018-01-17 NOTE — Telephone Encounter (Signed)
Ok to refill??  Last office visit 10/19/2017  Last refill 11/15/2017, #2 refills

## 2018-01-20 MED FILL — SHIPPING COST: 1 days supply | Qty: 1 | Fill #24

## 2018-01-20 MED FILL — PREGABALIN 150 MG CAPS: 150 | 30 days supply | Qty: 90 | Fill #0

## 2018-01-24 ENCOUNTER — Ambulatory Visit (INDEPENDENT_AMBULATORY_CARE_PROVIDER_SITE_OTHER): Payer: Medicare Other | Admitting: Family Medicine

## 2018-01-24 ENCOUNTER — Encounter (INDEPENDENT_AMBULATORY_CARE_PROVIDER_SITE_OTHER): Payer: Self-pay

## 2018-01-26 ENCOUNTER — Telehealth: Payer: BLUE CROSS/BLUE SHIELD

## 2018-01-31 MED FILL — NORTRIPTYLINE HCL 10 MG CAP: 10 | 30 days supply | Qty: 90 | Fill #0

## 2018-01-31 MED FILL — SHIPPING COST: 1 days supply | Qty: 1 | Fill #25

## 2018-02-04 MED ORDER — HYDROCHLOROTHIAZIDE 25 MG PO TABS
ORAL_TABLET | 0 refills | Status: AC
Start: 2018-02-04 — End: 2018-05-08

## 2018-02-07 MED FILL — CYCLOBENZAPRINE HCL 5 MG TA: 5 | 30 days supply | Qty: 90 | Fill #0

## 2018-02-07 MED FILL — SHIPPING COST: 1 days supply | Qty: 1 | Fill #26

## 2018-02-11 MED FILL — ZOLPIDEM TARTRATE 10 MG TAB: 10 | 30 days supply | Qty: 30 | Fill #0

## 2018-02-11 MED FILL — SHIPPING COST: 1 days supply | Qty: 1 | Fill #27

## 2018-02-19 MED FILL — PREGABALIN 150 MG CAPS: 150 | 30 days supply | Qty: 90 | Fill #1

## 2018-02-21 MED FILL — SHIPPING COST: 1 days supply | Qty: 1 | Fill #28

## 2018-02-21 MED FILL — LINZESS 145 MCG CAPSULE: 145 | 90 days supply | Qty: 90 | Fill #3

## 2018-02-22 ENCOUNTER — Other Ambulatory Visit: Payer: Self-pay | Admitting: Family Medicine

## 2018-02-22 MED FILL — EZETIMIBE 10 MG TABLET: 10 | 90 days supply | Qty: 90 | Fill #0

## 2018-02-22 MED FILL — SHIPPING COST: 1 days supply | Qty: 1 | Fill #29

## 2018-02-24 MED FILL — SHIPPING COST: 1 days supply | Qty: 1 | Fill #30

## 2018-02-24 MED FILL — NORTRIPTYLINE HCL 10 MG CAP: 10 | 30 days supply | Qty: 90 | Fill #1

## 2018-02-24 MED FILL — PANTOPRAZOLE SOD DR 40 MG T: 40 | 90 days supply | Qty: 180 | Fill #1

## 2018-02-28 MED FILL — SHIPPING COST: 1 days supply | Qty: 1 | Fill #31

## 2018-02-28 MED FILL — rOPINIRole HCL 0.5 MG TABS: 0.5 | 90 days supply | Qty: 90 | Fill #1

## 2018-03-01 MED FILL — QUETIAPINE FUMARATE 25 MG T: 25 | 30 days supply | Qty: 60 | Fill #1

## 2018-03-07 MED FILL — SHIPPING COST: 1 days supply | Qty: 1 | Fill #32

## 2018-03-07 MED FILL — CYCLOBENZAPRINE HCL 5 MG TA: 5 | 30 days supply | Qty: 90 | Fill #0

## 2018-03-13 MED FILL — ZOLPIDEM TARTRATE 10 MG TAB: 10 | 30 days supply | Qty: 30 | Fill #1

## 2018-03-14 MED FILL — SHIPPING COST: 1 days supply | Qty: 1 | Fill #33

## 2018-03-17 ENCOUNTER — Telehealth: Payer: No Typology Code available for payment source | Admitting: Family

## 2018-03-17 DIAGNOSIS — J00 Acute nasopharyngitis [common cold]: Secondary | ICD-10-CM | POA: Diagnosis not present

## 2018-03-17 MED ORDER — BENZONATATE 100 MG PO CAPS: 100.0000 mg | ORAL_CAPSULE | Freq: Three times a day (TID) | ORAL | 0 refills | Status: DC | PRN

## 2018-03-17 MED ORDER — FLUTICASONE PROPIONATE 50 MCG/ACT NA SUSP: 2.0000 | Freq: Every day | NASAL | 3 refills | Status: DC

## 2018-03-17 NOTE — Progress Notes (Signed)
Thank you for the details you included in the comment boxes. Those details are very helpful in determining the best course of treatment for you and help [place] to provide the best care.  We are sorry you are not feeling well.  Here is how we plan to help!  Based on what you have shared with me, it looks like you may have a viral upper respiratory infection or a "common cold".  Colds are caused by a large number of viruses; however, rhinovirus is the most common cause.   Symptoms of the common cold vary from person to person, with common symptoms including sore throat, cough, and malaise.  A low-grade fever of 100.4 may present, but is often uncommon.  Symptoms vary however, and are closely related to a person's age or underlying illnesses.  The most common symptoms associated with the common cold are nasal discharge or congestion, cough, sneezing, headache and pressure in the ears and face.  Cold symptoms usually persist for about 3 to 10 days, but can last up to 2 weeks.  It is important to know that colds do not cause serious illness or complications in most cases.    The common cold is transmitted from person to person, with the most common method of transmission being a person's hands.  The virus is able to live on the skin and can infect other persons for up to 2 hours after direct contact.  Also, colds are transmitted when someone coughs or sneezes; thus, it is important to cover the mouth to reduce this risk.  To keep the spread of the common cold at [place], good hand hygiene is very important.  This is an infection that is most likely caused by a virus. There are no specific treatments for the common cold other than to help you with the symptoms until the infection runs its course.    For nasal congestion, you may use an oral decongestants such as Mucinex D or if you have glaucoma or high blood pressure use plain Mucinex.  Saline nasal spray or nasal drops can help and can safely be used as often as  needed for congestion.  For your congestion, I have prescribed Fluticasone nasal spray one spray in each nostril twice a day  If you do not have a history of heart disease, hypertension, diabetes or thyroid disease, prostate/bladder issues or glaucoma, you may also use Sudafed to treat nasal congestion.  It is highly recommended that you consult with a pharmacist or your primary care physician to ensure this medication is safe for you to take.     If you have a cough, you may use cough suppressants such as Delsym and Robitussin.  If you have glaucoma or high blood pressure, you can also use Coricidin HBP.   For cough I have prescribed for you A prescription cough medication called Tessalon Perles 100 mg. You may take 1-2 capsules every 8 hours as needed for cough  If you have a sore or scratchy throat, use a saltwater gargle-  to  teaspoon of salt dissolved in a 4-ounce to 8-ounce glass of warm water.  Gargle the solution for approximately 15-30 seconds and then spit.  It is important not to swallow the solution.  You can also use throat lozenges/cough drops and Chloraseptic spray to help with throat pain or discomfort.  Warm or cold liquids can also be helpful in relieving throat pain.  For headache, pain or general discomfort, you can use Ibuprofen or Tylenol  as directed.   Some authorities believe that zinc sprays or the use of Echinacea may shorten the course of your symptoms.   HOME CARE . Only take medications as instructed by your medical team. . Be sure to drink plenty of fluids. Water is fine as well as fruit juices, sodas and electrolyte beverages. You may want to stay away from caffeine or alcohol. If you are nauseated, try taking small sips of liquids. How do you know if you are getting enough fluid? Your urine should be a pale yellow or almost colorless. . Get rest. . Taking a steamy shower or using a humidifier may help nasal congestion and ease sore throat pain. You can place a  towel over your head and breathe in the steam from hot water coming from a faucet. . Using a saline nasal spray works much the same way. . Cough drops, hard candies and sore throat lozenges may ease your cough. . Avoid close contacts especially the very young and the elderly . Cover your mouth if you cough or sneeze . Always remember to wash your hands.   GET HELP RIGHT AWAY IF: . You develop worsening fever. . If your symptoms do not improve within 10 days . You develop yellow or green discharge from your nose over 3 days. . You have coughing fits . You develop a severe head ache or visual changes. . You develop shortness of breath, difficulty breathing or start having chest pain . Your symptoms persist after you have completed your treatment plan  MAKE SURE YOU   Understand these instructions.  Will watch your condition.  Will get help right away if you are not doing well or get worse.  Your e-visit answers were reviewed by a board certified advanced clinical practitioner to complete your personal care plan. Depending upon the condition, your plan could have included both over the counter or prescription medications. Please review your pharmacy choice. If there is a problem, you may call our nursing hot line at and have the prescription routed to another pharmacy. Your safety is important to [place]. If you have drug allergies check your prescription carefully.   You can use MyChart to ask questions about today's visit, request a non-urgent call back, or ask for a work or school excuse for 24 hours related to this e-Visit. If it has been greater than 24 hours you will need to follow up with your provider, or enter a new e-Visit to address those concerns. You will get an e-mail in the next two days asking about your experience.  I hope that your e-visit has been valuable and will speed your recovery. Thank you for using e-visits.

## 2018-03-21 MED FILL — PREGABALIN 150 MG CAPS: 150 | 30 days supply | Qty: 90 | Fill #0

## 2018-03-21 MED FILL — SHIPPING COST: 1 days supply | Qty: 1 | Fill #34

## 2018-03-31 MED FILL — SHIPPING COST: 1 days supply | Qty: 1 | Fill #35

## 2018-03-31 MED FILL — CYCLOBENZAPRINE HCL 5 MG TA: 5 | 30 days supply | Qty: 90 | Fill #1

## 2018-04-12 MED FILL — SHIPPING COST: 1 days supply | Qty: 1 | Fill #36

## 2018-04-12 MED FILL — ZOLPIDEM TARTRATE 10 MG TAB: 10 | 30 days supply | Qty: 30 | Fill #2

## 2018-04-25 MED FILL — SHIPPING COST: 1 days supply | Qty: 1 | Fill #0

## 2018-04-25 MED FILL — CYCLOBENZAPRINE HCL 5 MG TA: 5 | 30 days supply | Qty: 90 | Fill #2

## 2018-05-08 MED ORDER — HYDROCHLOROTHIAZIDE 25 MG PO TABS
ORAL_TABLET | 0 refills | Status: AC
Start: 2018-05-08 — End: 2018-06-23

## 2018-05-13 ENCOUNTER — Telehealth: Payer: No Typology Code available for payment source | Admitting: Family

## 2018-05-13 DIAGNOSIS — J329 Chronic sinusitis, unspecified: Secondary | ICD-10-CM | POA: Diagnosis not present

## 2018-05-13 DIAGNOSIS — J00 Acute nasopharyngitis [common cold]: Secondary | ICD-10-CM | POA: Diagnosis not present

## 2018-05-13 DIAGNOSIS — B9789 Other viral agents as the cause of diseases classified elsewhere: Secondary | ICD-10-CM | POA: Diagnosis not present

## 2018-05-13 MED ORDER — FLUTICASONE PROPIONATE 50 MCG/ACT NA SUSP: 2.0000 | Freq: Every day | NASAL | 3 refills | Status: DC

## 2018-05-13 NOTE — Progress Notes (Signed)
Greater than 5 minutes, yet less than 10 minutes of time have been spent researching, coordinating, and implementing care for this patient today.  Thank you for the details you included in the comment boxes. Those details are very helpful in determining the best course of treatment for you and help us to provide the best care.  We are sorry that you are not feeling well.  Here is how we plan to help!  Based on what you have shared with me it looks like you have sinusitis.  Sinusitis is inflammation and infection in the sinus cavities of the head.  Based on your presentation I believe you most likely have Acute Viral Sinusitis.This is an infection most likely caused by a virus. There is not specific treatment for viral sinusitis other than to help you with the symptoms until the infection runs its course.  You may use an oral decongestant such as Mucinex D or if you have glaucoma or high blood pressure use plain Mucinex. Saline nasal spray help and can safely be used as often as needed for congestion, I have prescribed: Fluticasone nasal spray two sprays in each nostril once a day  Some authorities believe that zinc sprays or the use of Echinacea may shorten the course of your symptoms.  Sinus infections are not as easily transmitted as other respiratory infection, however we still recommend that you avoid close contact with loved ones, especially the very young and elderly.  Remember to wash your hands thoroughly throughout the day as this is the number one way to prevent the spread of infection!  Home Care:  Only take medications as instructed by your medical team.  Do not take these medications with alcohol.  A steam or ultrasonic humidifier can help congestion.  You can place a towel over your head and breathe in the steam from hot water coming from a faucet.  Avoid close contacts especially the very young and the elderly.  Cover your mouth when you cough or sneeze.  Always remember to  wash your hands.  Get Help Right Away If:  You develop worsening fever or sinus pain.  You develop a severe head ache or visual changes.  Your symptoms persist after you have completed your treatment plan.  Make sure you  Understand these instructions.  Will watch your condition.  Will get help right away if you are not doing well or get worse.  Your e-visit answers were reviewed by a board certified advanced clinical practitioner to complete your personal care plan.  Depending on the condition, your plan could have included both over the counter or prescription medications.  If there is a problem please reply  once you have received a response from your provider.  Your safety is important to us.  If you have drug allergies check your prescription carefully.    You can use MyChart to ask questions about today's visit, request a non-urgent call back, or ask for a work or school excuse for 24 hours related to this e-Visit. If it has been greater than 24 hours you will need to follow up with your provider, or enter a new e-Visit to address those concerns.  You will get an e-mail in the next two days asking about your experience.  I hope that your e-visit has been valuable and will speed your recovery. Thank you for using e-visits.    

## 2018-05-16 ENCOUNTER — Ambulatory Visit (INDEPENDENT_AMBULATORY_CARE_PROVIDER_SITE_OTHER): Payer: Self-pay | Admitting: Family Medicine

## 2018-05-16 ENCOUNTER — Encounter: Payer: Self-pay | Admitting: Physician Assistant

## 2018-05-16 VITALS — BP 130/120 | HR 117 | Temp 98.5°F | Resp 14 | Wt 242.8 lb

## 2018-05-16 DIAGNOSIS — R030 Elevated blood-pressure reading, without diagnosis of hypertension: Secondary | ICD-10-CM

## 2018-05-16 DIAGNOSIS — R51 Headache: Secondary | ICD-10-CM

## 2018-05-16 DIAGNOSIS — R519 Headache, unspecified: Secondary | ICD-10-CM

## 2018-05-16 MED FILL — SHIPPING COST: 1 days supply | Qty: 1 | Fill #1

## 2018-05-16 MED FILL — LINZESS 145 MCG CAPSULE: 145 | 30 days supply | Qty: 30 | Fill #4

## 2018-05-16 NOTE — Progress Notes (Signed)
Patient not seen or evaluated elevated blood pressure- E visit in the last 72 hours advised to seek care with higher level of care with expanded diagnostic capabilities.

## 2018-05-16 NOTE — Patient Instructions (Addendum)
Patient not seen or evaluated elevated blood pressure- E visit in the last 72 hours advised to seek care with higher level of care with expanded diagnostic capabilities.

## 2018-05-17 ENCOUNTER — Ambulatory Visit (INDEPENDENT_AMBULATORY_CARE_PROVIDER_SITE_OTHER): Payer: No Typology Code available for payment source | Admitting: Family Medicine

## 2018-05-17 ENCOUNTER — Encounter: Payer: Self-pay | Admitting: Family Medicine

## 2018-05-17 ENCOUNTER — Other Ambulatory Visit: Payer: Self-pay

## 2018-05-17 ENCOUNTER — Other Ambulatory Visit: Payer: Self-pay | Admitting: Family Medicine

## 2018-05-17 ENCOUNTER — Ambulatory Visit: Payer: Medicare Other | Admitting: Family Medicine

## 2018-05-17 VITALS — BP 128/82 | HR 86 | Temp 98.7°F | Resp 16 | Ht 71.0 in | Wt 243.0 lb

## 2018-05-17 DIAGNOSIS — J069 Acute upper respiratory infection, unspecified: Secondary | ICD-10-CM | POA: Diagnosis not present

## 2018-05-17 DIAGNOSIS — J0100 Acute maxillary sinusitis, unspecified: Secondary | ICD-10-CM | POA: Diagnosis not present

## 2018-05-17 LAB — INFLUENZA A AND B AG, IMMUNOASSAY
INFLUENZA A ANTIGEN: NOT DETECTED
INFLUENZA B ANTIGEN: NOT DETECTED

## 2018-05-17 MED ORDER — GUAIFENESIN-CODEINE 100-10 MG/5ML PO SOLN: 5.0000 mL | Freq: Four times a day (QID) | ORAL | 0 refills | Status: DC | PRN

## 2018-05-17 MED ORDER — ZOLPIDEM TARTRATE 10 MG PO TABS: ORAL_TABLET | ORAL | 2 refills | Status: DC

## 2018-05-17 MED ORDER — AMOXICILLIN 875 MG PO TABS: 875.0000 mg | ORAL_TABLET | Freq: Two times a day (BID) | ORAL | 0 refills | Status: DC

## 2018-05-17 MED FILL — ZOLPIDEM TARTRATE 10 MG TAB: 10 | 30 days supply | Qty: 30 | Fill #0

## 2018-05-17 MED FILL — SHIPPING COST: 1 days supply | Qty: 1 | Fill #2

## 2018-05-17 MED FILL — EZETIMIBE 10 MG TABS: 10 | 90 days supply | Qty: 90 | Fill #0

## 2018-05-17 NOTE — Progress Notes (Signed)
   Subjective:    Patient ID: Derek [name], male    DOB: Feb 10, 1961, 58 y.o.   MRN: [id number]  Patient presents for Illness (x1 week- cough, ear pain, sore throat, nasal congestion, HA, no fever)  Pt here with cough with congestion, ear pain, sore throat,headache fever. Unable to use CPAP due to nasal congestion. No high fevers, no chills.   Note there a partial visit in chart from instate about his BP being elevated to 130/120 ,He was concerned he had the flu Also seen as E visit on 2/7 diagnosed with viral sinusitis ,  advised to use mucinex DM and Mucniex D, nyquil/flonase   He has been taking tylenol every 4 hours    Doing weight watchers, lost 22lbs before thanksgiving , has restarted as he gained the weight back    Review Of Systems:  GEN- denies fatigue,+ fever, weight loss,weakness, recent illness HEENT- denies eye drainage, change in vision, +nasal discharge, CVS- denies chest pain, palpitations RESP- denies SOB, +cough, wheeze ABD- denies N/V, change in stools, abd pain GU- denies dysuria, hematuria, dribbling, incontinence MSK- denies joint pain, muscle aches, injury Neuro- denies headache, dizziness, syncope, seizure activity       Objective:    BP 128/82   Pulse 86   Temp 98.7 F (37.1 C) (Oral)   Resp 16   Ht 5\' 11"  (1.803 m)   Wt 243 lb (110.2 kg)   SpO2 98%   BMI 33.89 kg/m  GEN- NAD, alert and oriented x3 HEENT- PERRL, EOMI, non injected sclera, pink conjunctiva, MMM, oropharynx mild injection, TM clear bilat no effusion,  + maxillary sinus tenderness, inflammed turbinates,  Nasal drainage  Neck- Supple, shotty ant LAD CVS- RRR, no murmur RESP-CTAB EXT- No edema Pulses- Radial 2+  Flu neg          Assessment & Plan:     I believe BP yesterday was an error, repeat BP today 128/84   Problem List Items Addressed This Visit    None    Visit Diagnoses    Acute URI    -  Primary   URI with sinusitis, treat with amoxicillin, failed  conservative care, nasal rinse, robitussin codiene for cough   Relevant Orders   Influenza A and B Ag, Immunoassay (Completed)   Acute maxillary sinusitis, recurrence not specified       Relevant Medications   amoxicillin (AMOXIL) 875 MG tablet   guaiFENesin-codeine 100-10 MG/5ML syrup      Note: This dictation was prepared with Dragon dictation along with smaller phrase technology. Any transcriptional errors that result from this process are unintentional.

## 2018-05-17 NOTE — Patient Instructions (Signed)
F/U mid June for your physical - after the 13th Take antibiotics, use codiene cough medicine

## 2018-05-18 ENCOUNTER — Telehealth: Payer: Self-pay

## 2018-05-18 NOTE — Telephone Encounter (Signed)
Patient was not available.

## 2018-05-19 MED FILL — SHIPPING COST: 1 days supply | Qty: 1 | Fill #3

## 2018-05-19 MED FILL — PANTOPRAZOLE SOD DR 40 MG T: 40 | 90 days supply | Qty: 180 | Fill #2

## 2018-05-19 MED FILL — CYCLOBENZAPRINE HCL 5 MG TA: 5 | 30 days supply | Qty: 90 | Fill #0

## 2018-05-29 MED FILL — rOPINIRole HCL 0.5 MG TABS: 0.5 | 90 days supply | Qty: 90 | Fill #2

## 2018-05-30 MED FILL — SHIPPING COST: 1 days supply | Qty: 1 | Fill #4

## 2018-06-09 MED FILL — LINZESS 145 MCG CAPSULE: 145 | 30 days supply | Qty: 30 | Fill #5

## 2018-06-09 MED FILL — SHIPPING COST: 1 days supply | Qty: 1 | Fill #5

## 2018-06-16 MED FILL — ZOLPIDEM TARTRATE 10 MG TAB: 10 | 30 days supply | Qty: 30 | Fill #1

## 2018-06-16 MED FILL — SHIPPING COST: 1 days supply | Qty: 1 | Fill #6

## 2018-06-23 ENCOUNTER — Ambulatory Visit: Payer: BLUE CROSS/BLUE SHIELD

## 2018-06-23 DIAGNOSIS — E119 Type 2 diabetes mellitus without complications: Secondary | ICD-10-CM

## 2018-06-23 LAB — Comprehensive Metabolic Panel
ALKALINE PHOSPHATASE: 68 U/L (ref 37–113)
TOTAL CO2: 23 mmol/L (ref 20–30)

## 2018-06-23 LAB — Lipid Panel: TRIGLYCERIDES: 220 mg/dL — ABNORMAL HIGH (ref <150)

## 2018-06-23 LAB — Differential Automated: ABSOLUTE BASO COUNT: 0.09 10*3/uL (ref 0.00–0.10)

## 2018-06-23 LAB — CBC: PLATELET COUNT, AUTO: 300 10*3/uL (ref 143–398)

## 2018-06-23 MED ORDER — ATORVASTATIN CALCIUM 80 MG PO TABS
ORAL_TABLET | 3 refills | Status: AC
Start: 2018-06-23 — End: ?

## 2018-06-23 MED ORDER — METFORMIN HCL ER 500 MG PO TB24
ORAL_TABLET | 3 refills | Status: AC
Start: 2018-06-23 — End: ?

## 2018-06-23 MED ORDER — LOSARTAN POTASSIUM 50 MG PO TABS
50 mg | ORAL_TABLET | Freq: Every day | ORAL | 3 refills | Status: AC
Start: 2018-06-23 — End: ?

## 2018-06-23 MED ORDER — HYDROCHLOROTHIAZIDE 25 MG PO TABS
ORAL_TABLET | 3 refills | Status: AC
Start: 2018-06-23 — End: ?

## 2018-06-23 NOTE — Progress Notes
PROGRESS NOTE    Patient:  Keith Cabrera    Medical record number:  [id number]    Date of birth:  01/29/1961  Date of service:  06/23/2018      Chief complaint:    Chief Complaint   Patient presents with   ??? Med Management       History of present illness:           [name] is a 58 y.o. male          Who presents with the following history of present illness:    Pt is here to  Est care     He takes metformin for DM     Takes  HCT and Losartan   He sees Dr. Work Cardiologist     Past medical history:           Patient Active Problem List   Diagnosis   ??? Type 2 diabetes mellitus without complication (HCC/RAF)   ??? History of coronary artery stent placement   ??? Coronary artery disease involving native coronary artery without angina pectoris   ??? Other and unspecified hyperlipidemia   ??? Essential hypertension   ??? Mixed hyperlipidemia   ??? Ingrown toenail   ??? Skin lesions   ??? History of colonoscopy with polypectomy   ??? Smokes cigars            Past Medical History:   Diagnosis Date   ??? Hypertension 04/28/2006    HYPERTENSION NOS     Past surgical history:         Past Surgical History:   Procedure Laterality Date   ??? COLONOSCOPY N/A 02/21/2016   ??? CORONARY ANGIOPLASTY  03/13/13       Medications:         Medications that the patient states to be currently taking   Medication Sig   ??? aspirin 81 mg EC tablet Take 81 mg by mouth daily.   ??? atorvastatin 80 mg tablet TAKE 1 TABLET DAILY.   ??? HYDROCHLOROTHIAZIDE 25 mg tablet TAKE 1 TABLET DAILY   ??? losartan 50 mg tablet Take 1 tablet (50 mg total) by mouth daily.   ??? metFORMIN (GLUCOPHAGE XR) 500 mg PO ER 24 hr tablet TAKE 2 TABLETS TWICE A DAY (NEED CLINIC FOLLOW UP).   ??? Omega-3 Fatty Acids (FISH OIL) 1000 mg capsule Take 1 g by mouth daily.   ??? ONETOUCH DELICA FINE lancets USE 1 LANCET TWICE A DAY   ??? ONETOUCH ULTRA BLUE test strip USE 1 STRIP TWICE A DAY   ??? vitamin E 100 unit capsule Take 100 Units by mouth daily.       Allergy:       No Known Allergies Family history:         Family History   Problem Relation Age of Onset   ??? Alzheimer's disease Mother    ??? Heart disease Father        Social history:         Social History     Tobacco Use   ??? Smoking status: Former Smoker   ??? Smokeless tobacco: Never Used   ??? Tobacco comment: occ. cigar   Substance Use Topics   ??? Alcohol use: Not on file       ROS:       []  Not Obtainable        (Check normal findings)                                                          (  Note positive findings)     []  Constitutional:   no weakness, dizziness, wt change     []  Eyes:   no itching, tearing, blurred vision     []  ENMT:   no runny nose, sneezing, hoarseness     []  Respiratory:   no sob, cough, wheeze     []  Cardiovascular:    no chest pain, palpitations        []  Gastrointestinal:    no nausea, vomiting, diarrhea     []  Genitourinary:    no frequency, dysurea, nocturea     []  Skin:    no rash, itching     []  Musculoskeletal:    no joint pain, myalgia, tendinitis     []  Neurological:    no headache, tremor, incoordination       []  Endocrine:  no hot flashes, diabetes, thyroid disorder     []  Heme/lymphatic:    no swollen glands, bruising     []  Allergic/immunologic:    no allergy or autoimmunity     []  Psychiatric:    no anxiety or depression                    PE:         BP 126/80  ~ Pulse 66  ~ Temp 36.3 ???C (97.4 ???F) (Oral)  ~ Resp 15  ~ Ht 5' 10'' (1.778 m)  ~ Wt (!) 229 lb (103.9 kg)  ~ BMI 32.86 kg/m???           Constitutional:    Appearance:   Well nourished, well developed, in no acute distress  Respiratory:  Clear to auscultation, non labored breathing  Cardiovascular:  Regular rate and rhythm, no murmer, gallop or rub  Musculoskeletal:  No significant abnormalities seen  Skin and subcutaneous:  No significant lesions seen  Psychiatric:   Mood normal, affect appropriate    Assessment/ Plan:    1. Type 2 diabetes mellitus without complication, without long-term current use of insulin (HCC/RAF) - metFORMIN (GLUCOPHAGE XR) 500 mg PO ER 24 hr tablet; TAKE 2 TABLETS TWICE A DAY (NEED CLINIC FOLLOW UP).  Dispense: 360 tablet; Refill: 3  - CBC & Plt & Diff; Future  - Hgb A1c; Future  - Comprehensive Metabolic Panel; Future    2. History of coronary artery stent placement  - was on plavix but not anymore  - will see cardiologist for f/u     3. Essential hypertension  - losartan 50 mg tablet; Take 1 tablet (50 mg total) by mouth daily.  Dispense: 90 tablet; Refill: 3  - hydroCHLOROthiazide 25 mg tablet; TAKE 1 TABLET DAILY.  Dispense: 90 tablet; Refill: 3    4. Mixed hyperlipidemia  - atorvastatin 80 mg tablet; TAKE 1 TABLET DAILY.  Dispense: 90 tablet; Refill: 3  - Comprehensive Metabolic Panel; Future  - Lipid Panel; Future         The above recommendation were discussed with the patient.  The patient has all questions answered satisfactorily and is in agreement with this recommended plan of care.    Author:       Erling Cruz, MD  8:18 AM

## 2018-06-24 ENCOUNTER — Ambulatory Visit: Payer: BLUE CROSS/BLUE SHIELD

## 2018-06-24 DIAGNOSIS — E785 Hyperlipidemia, unspecified: Secondary | ICD-10-CM

## 2018-06-24 DIAGNOSIS — I2510 Atherosclerotic heart disease of native coronary artery without angina pectoris: Secondary | ICD-10-CM

## 2018-06-24 DIAGNOSIS — E1165 Type 2 diabetes mellitus with hyperglycemia: Secondary | ICD-10-CM

## 2018-06-24 DIAGNOSIS — I10 Essential (primary) hypertension: Secondary | ICD-10-CM

## 2018-06-24 LAB — Hgb A1c: HGB A1C - HPLC: 7.7 — ABNORMAL HIGH (ref <5.7)

## 2018-06-24 MED ORDER — SEMAGLUTIDE 7 MG PO TABS
7 mg | ORAL_TABLET | Freq: Every day | ORAL | 1 refills | Status: AC
Start: 2018-06-24 — End: 2018-07-08

## 2018-06-24 NOTE — Patient Instructions
1. Continue metformin.  2. Start Rybelsus for diabetes. This medication helps control your blood sugars by increasing GLP-1 (a hormone from your gut) levels to increase insulin levels and decrease glucagon levels, two hormones that play a role in blood sugar control. Rybelsus may also help decrease your appetite and lead to weight loss.  - Start Rybelsus 3 mg daily for 30 days.  - Then increase to 7 mg daily.  - We may consider increasing to 14 mg daily after another 30 days if your blood glucose levels remain elevated on 7 mg daily dose.  - Please remember to take Rybelsus upon waking as it must be taken on an empty stomach. Take with no more than 4 oz of water and it is best with just a sip of plain water. Wait 30 minutes before the first food, beverage, or other oral medications. Waiting less than 30 minutes, or taking with food, beverages (other than plain water), or other oral medications will lessen the effect of Rybelsus. Waiting more than 30 minutes to eat may increase the absorption of Rybelsus.  - Observe for potential side effects including nausea, vomiting, abdominal discomfort and pancreatitis (sharp abdominal pain in your mid abdomen with nausea and possible vomiting).

## 2018-06-24 NOTE — Progress Notes
ENDOCRINOLOGY CONSULTATION    PATIENT: Keith Cabrera  MRN: [id number]  DOB: 06/25/1960  DATE OF SERVICE: 06/24/2018    REFERRING PRACTITIONER: Audie Box., *   PRIMARY CARE PROVIDER: Erling Cruz., MD    Reason for Consultation: Diabetes Mellitus Type 2      Chief Complaint:     Chief Complaint   Patient presents with   ??? Establish Care   ??? Diabetes       History of Present Illness:   Keith Cabrera is a 58 y.o. male who  has a past medical history of coronary artery disease, diabetes mellitus type 2, hyperlipidemia, and hypertension that presents for evaluation and management of diabetes mellitus type 2.     Diabetes mellitus was diagnosed in 2007 with symptoms of polyuria, polydipsia, polyphagia, light headedness, and unexplained weight loss. Diabetes has been solely managed with metformin since diagnosis. He does have history of dietary non adherence and has had poor or suboptimal diabetes control since at least 2015. There is no history of hospitalizations for diabetes. There is no known immediate family history of diabetes.    He says he has always been an ''eater'' since his childhood although he has had times when he could control his caloric intake. He says he has to focus on his diet to maintain healthy eating and low carbohydrate intake.      Current DM medications:  - Metformin ER 1000 mg BID    Glucose review:  Data from pt recall:    Frequency of Checks: 2x/day for past 4 weeks    Fasting: 120-180s  ACD: 120-180s    Exercise:   Walks around house and weightlifting at home.    Nutrition:  Breakfast: Apple and nuts or sometimes beef jerky or oatmeal with nuts.  Lunch: Protein (chicken) with quinoa.  Dinner: [employer] with chips.   Snacks: Oranges in between meals. Potato chips with sour cream. Ice cream or cookies.    Symptoms:  Blurry vision: No , sometimes blurry if blood glucose  Numbness/tingling: No  Polyuria: No  Polydipsia: No  Nausea/Vomiting: No Known complications and comorbid conditions:   Prior hospitalization for DKA: No  Prior hospitalization of hyperosmolar hyperglycemic state: No    Complications & Associated Co-morbidities Yes No Comments/Treatment to Date   Retinopathy  []    [x]      Nephropathy  []    [x]      Neuropathy  []    [x]      Gastroparesis  []   [x]     CAD  [x]    []   S/p DES ostial LAD 03/13/13   Stroke  []    [x]      PVD  []   [x]     Sleep Apnea  []    [x]   Snores at night       Routine maintenance:   Routine Maintenance Yes No Comments/Last Performed   ACE-I/ARB  [x]    []      Statin  [x]    []      Retinal exam  [x]    []   03/18/18   Foot exam  [x]    []   12/03/17   Flu shot  []    [x]   Also has not had pneumonia vaccine       Past Medical History:     Past Medical History:   Diagnosis Date   ??? Coronary artery disease    ??? Diabetes mellitus, type 2 (HCC/RAF)    ??? Hyperlipidemia    ??? Hypertension  Past Surgical History:     Past Surgical History:   Procedure Laterality Date   ??? COLONOSCOPY N/A 02/21/2016   ??? CORONARY STENT PLACEMENT  03/13/13       Medications:     Current Outpatient Medications   Medication Sig   ??? aspirin 81 mg EC tablet Take 81 mg by mouth daily.   ??? atorvastatin 80 mg tablet TAKE 1 TABLET DAILY.   ??? hydroCHLOROthiazide 25 mg tablet TAKE 1 TABLET DAILY.   ??? losartan 50 mg tablet Take 1 tablet (50 mg total) by mouth daily.   ??? metFORMIN (GLUCOPHAGE XR) 500 mg PO ER 24 hr tablet TAKE 2 TABLETS TWICE A DAY (NEED CLINIC FOLLOW UP).   ??? Omega-3 Fatty Acids (FISH OIL) 1000 mg capsule Take 1 g by mouth daily.   ??? ONETOUCH DELICA FINE lancets USE 1 LANCET TWICE A DAY   ??? ONETOUCH ULTRA BLUE test strip USE 1 STRIP TWICE A DAY   ??? vitamin E 100 unit capsule Take 100 Units by mouth daily.     No current facility-administered medications for this visit.        Allergies:   No Known Allergies    Social History:    reports that he has quit smoking. He has never used smokeless tobacco.   Social History     Social History Narrative ??? Not on file       Family History:     Family History   Problem Relation Age of Onset   ??? Alzheimer's disease Mother    ??? Heart disease Father        Review of Systems:   14 system ROS negative except as described above and in the HPI.     Physical Examination:   Vital Signs: BP 120/76  ~ Pulse 68  ~ Temp 36.7 ???C (98 ???F) (Oral)  ~ Resp 16  ~ Ht 5' 11'' (1.803 m) Comment: verbal ~ Wt (!) 230 lb 6.4 oz (104.5 kg)  ~ SpO2 96%  ~ BMI 32.13 kg/m???  Body mass index is 32.13 kg/m???.   Wt Readings from Last 3 Encounters:   06/24/18 (!) 230 lb 6.4 oz (104.5 kg)   06/23/18 (!) 229 lb (103.9 kg)   12/03/17 (!) 234 lb 9.6 oz (106.4 kg)        General: Well-developed, well-nourished, in no acute distress.  Appears stated age.   HEENT: Normocephalic and atraumatic. Extraocular muscles intact, sclerae anicteric and conjunctivae pink. Moist mucous membranes and oropharynx clear.  Neck: Neck supple and trachea is middle. Thyroid is palpable without thyromegaly and there are no appreciable thyroid nodules.  CV: Regular rate and rhythm, S1 and S2 normal, no murmurs, rubs or gallops. No lower extremity edema.  Lungs: Normal respiratory effort. The lungs are clear to auscultation bilaterally. There are no wheezes, rhonchi or rales.  GI: The abdomen is soft to palpation. Non-tender and non-distended.  Normoactive bowel sounds.  Lymph: No palpable cervical lymphadenopathy.  Neuro: Normal upper and lower extremity strength bilaterally. No focal deficits.  Psych: Alert and oriented x4, normal mood and affect.   Skin: Normal turgor.  No rashes.       Laboratory Results:   I have   [] reviewed radiology,  [] reviewed labs,  [] reviewed & summarized old records, [] requested outside medical records.    Lab Results   Component Value Date    HGBA1C 7.7 (H) 06/23/2018    HGBA1C 8.5 (H) 11/27/2017    HGBA1C 7.5 (  H) 08/28/2017     Lab Results   Component Value Date    GLUCOSE 171 (H) 06/23/2018       Lab Results   Component Value Date CREAT 0.87 06/23/2018    BUN 18 06/23/2018    NA 138 06/23/2018    K 4.6 06/23/2018    CL 99 06/23/2018    CO2 23 06/23/2018     Lab Results   Component Value Date    WBC 7.08 06/23/2018    HGB 15.7 06/23/2018    HCT 48.1 06/23/2018    MCV 94.5 06/23/2018    PLT 300 06/23/2018     No results found for: OSMOLALITY  Lab Results   Component Value Date    KETONESUR Negative 11/27/2017     No results found for: PHVEN, PHART    Lab Results   Component Value Date    CHOL 201 06/23/2018    CHOLHDL 49 06/23/2018    TRIGLY 220 (H) 06/23/2018       Recent Labs     06/23/18  0843   GLUCOSE 171*     No results for input(s): GLUCOSEPOC in the last 72 hours.  No results for input(s): INSULIN, CPEPTIDE in the last 72 hours.      Imaging and Studies:   None.      Assessment and Plan:   58 y.o. male presenting to Endocrine clinic with following issues:    1. Type 2 Diabetes Mellitus  Lab Results   Component Value Date    HGBA1C 7.7 (H) 06/23/2018    HGBA1C 8.5 (H) 11/27/2017    HGBA1C 7.5 (H) 08/28/2017     Uncontrolled T2DM with no hypoglycemia. No diabetes-related microvascular complications. A1C goal < 7% to reduce risk of diabetes related complications to the eyes, feet and kidneys with additional goal of hypoglycemia risk reduction.   - Continue metformin ER 1000 mg BID.  - Start Rybelsus. Reviewed titration instructions and potential side effects.  - Reviewed dietary modifications.  - Recommend exercise as tolerated.  - Continue glucose monitoring.  - Recommend annual ophthalmic examinations.  - Recommend annual diabetic foot examinations.  - Discussed risk of  long-term microvascular complications in relation to diabetes control and its relation to HgbA1C.  - Counseled on basic diabetes physiology/pathophysiology and glucose/insulin physiology.  - Fasting labs prior to next appointment:   - Comprehensive Metabolic Panel; Future   - Hgb A1c; Future   - Albumin/Creat Ratio Ur; Future   - Urinalysis,Routine; Future - Vitamin B12; Future   - Lipid Panel; Future   - TSH with reflex FT4, FT3; Future     2. Hypertension, essential  Blood pressure goal is <140/90 or <130/80 may be appropriate for those at high risk for cardiovascular disease, if it can be achieved safely.  - Continue current regimen, including ARB.    3. Hyperlipidemia, unspecified hyperlipidemia type  Per ACC/AHA and ACE/AACE guidelines, statin therapy is recommended with history of diabetes mellitus.  - Continue statin therapy.    4. Diabetic microvascular disease screening:  Health Maintenance   Topic Date Due   ??? Diabetes: Kidney Monitoring  11/28/2018   ??? Diabetes: Foot Exam  12/04/2018   ??? Diabetes: Hemoglobin A1c Blood Test  12/24/2018   ??? Diabetes: Dilated Eye Exam  03/19/2019       5. Coronary artery disease involving native coronary artery of native heart without angina pectoris  Status post DES ostial LAD 03/2013.  - Continue aspirin.  -  Management of diabetes, hypertension and hyperlipidemia per above.  - Follow up with cardiologist.    Patient Instructions   1. Continue metformin.  2. Start Rybelsus for diabetes. This medication helps control your blood sugars by increasing GLP-1 (a hormone from your gut) levels to increase insulin levels and decrease glucagon levels, two hormones that play a role in blood sugar control. Rybelsus may also help decrease your appetite and lead to weight loss.  - Start Rybelsus 3 mg daily for 30 days.  - Then increase to 7 mg daily.  - We may consider increasing to 14 mg daily after another 30 days if your blood glucose levels remain elevated on 7 mg daily dose.  - Please remember to take Rybelsus upon waking as it must be taken on an empty stomach. Take with no more than 4 oz of water and it is best with just a sip of plain water. Wait 30 minutes before the first food, beverage, or other oral medications. Waiting less than 30 minutes, or taking with food, beverages (other than plain water), or other oral medications will lessen the effect of Rybelsus. Waiting more than 30 minutes to eat may increase the absorption of Rybelsus.  - Observe for potential side effects including nausea, vomiting, abdominal discomfort and pancreatitis (sharp abdominal pain in your mid abdomen with nausea and possible vomiting).    Return in about 3 months (around 09/24/2018).      Thank you Dr. Sheryle Hail for allowing me to participate in the care of your patient.    Dr. Benny Lennert, MD  Assistant Clinical Professor  [employer]  Department of Medicine  Division of Endocrinology, Diabetes and Hypertension  [facility]  [address], [address]  [place], [place] [address]    Author: Benny Lennert, MD 06/24/2018 8:16 AM

## 2018-06-27 ENCOUNTER — Telehealth: Payer: BLUE CROSS/BLUE SHIELD

## 2018-06-27 NOTE — Telephone Encounter
Please update    Forwarded by: Blima Rich

## 2018-06-27 NOTE — Telephone Encounter
Message to Practice/Provider    MD: Maryanna Shape    Message:  Patient retuning call from Dr Melvern Banker office regarding providing the insurance information  XDM [id number] Grp# [phone]    Return call is not being requested by the patient or caller.    Patient or caller has been notified of the 24-48 hour processing turnaround time if applicable.

## 2018-06-27 NOTE — Telephone Encounter
Called pt and LVM to call back to get updated Member ID to do the prior auth, Prior Auth started but there's a mismatch with the member ID

## 2018-06-27 NOTE — Telephone Encounter
Medication Prior Authorization    Name of medication: Semaglutide (RYBELSUS) 7 MG TABS     Prescribing MD: Famini    ? Prescribed date: 06/24/18    Is there an alternative covered medication? Not sure    Has patient taken any other medications for this matter? Not sure    Insurance company phone number: not provided    [employer] number: not provided    Patient has been notified of the 24-48 hour turnaround time.   Pt. advised he needs a prior auth for Semaglutide (RYBELSUS) 7 MG TABS per the pharmacy or a PA number. Please provide pt. with a status update. Thank you    [id number] - [place], [place] - [address] [place] [address] AT [facility] [address] [place] [address]  [address] [place] [place] [address] [place] [address]  Phone: [phone] Fax: [phone]

## 2018-06-28 ENCOUNTER — Telehealth: Payer: BLUE CROSS/BLUE SHIELD

## 2018-06-28 DIAGNOSIS — I2510 Atherosclerotic heart disease of native coronary artery without angina pectoris: Secondary | ICD-10-CM

## 2018-06-28 NOTE — Telephone Encounter
Done

## 2018-06-28 NOTE — Telephone Encounter
Referral Request    1) Patient is requesting a referral to:  Cardiology     Specific location? [address], [place], [place] [address]   Particular MD in mind? Dr. Tinnie Gens Work    2) The issue (diagnosis, symptoms):    F/u care; check up  3) Has patient been seen by their doctor for this issue? No     4) Was an appointment offered? No    5) Patient's last office visit: 06/23/2018    Patient has been notified of the 24-48 hour turnaround time.

## 2018-06-28 NOTE — Telephone Encounter
Forwarded by: Sunya Humbarger ALBERTO Abagail Limb    Please advise on message below.

## 2018-06-28 NOTE — Telephone Encounter
Message to Practice/Provider    MD: Dr. Maryanna Shape     Message: Per Keith Cabrera from [employer], she is following up on the Rybelsus 7 mg PA. The office needs to submit it manually though fax.    Plan's # [employer] [phone]    [employer]  CBN [phone]   Ref [id number]       She is sending a hard copy fax to the office today. Please be on the lookout.       Return call is not being requested by the patient or caller.    Patient or caller has been notified of the 24-48 hour processing turnaround time if applicable.

## 2018-06-28 NOTE — Telephone Encounter
Prescription for Rybelsus 7 mg daily was sent to pharmacy on with receipt confirming prescription on 06/24/2018.  [id number] [place], [place] - [address] [facility] [address] [place] [place] [address]   [address] [place] [place] [address] [place] [address]   Phone: [phone] Fax: [phone]     Please talk with pharmacist and confirm order.

## 2018-06-28 NOTE — Telephone Encounter
Message to Practice/Provider    MD: Dr. Maryanna Shape    Message:  Per pt called in regards to his medications - stated he went to his pharmacy over the weekend and they do not have it. Please resend. He also would like to know if his medication has been approved by his insurance? Please revise and advise at your earliest convenience.     Medications:  Semaglutide (RYBELSUS) 7 MG TABS   Semaglutide (RYBELSUS) 3 MG TABS    Preferred Pharmacy:  [id number] - [place], [place] - [address] [facility] [address] [place] [address]  [address] [place] [place] [address] [place] [address]  Phone: [phone] Fax: [phone]        Return call is not being requested by the patient or caller.    Patient or caller has been notified of the 24-48 hour processing turnaround time if applicable.

## 2018-06-28 NOTE — Telephone Encounter
Processed,order mailed to patient.

## 2018-07-04 NOTE — Telephone Encounter
Advised patient a Engineer, site will call him back to advise. Patient prefers the following number  [phone]

## 2018-07-04 NOTE — Telephone Encounter
PDL Call to Practice    Reason for Call: Pt following up on status of his PA from 03/24. He has not heard back from the office. Per pt, his insurance and pharmacy sent the request last week.   MD: Dr. Maryanna Shape     Appointment Related?  []  Yes  [x]  No     If yes;  Date:  Time:    Call warm transferred to PDL: [x]  Yes  []  No    Call Received by Practice Representative: Trula Ore

## 2018-07-05 NOTE — Telephone Encounter
PA submitted ref: [id number].  [phone]    Spoke to pt advised PA pending.   Pt acknowledged understanding.

## 2018-07-07 DIAGNOSIS — E1165 Type 2 diabetes mellitus with hyperglycemia: Secondary | ICD-10-CM

## 2018-07-07 MED ORDER — EMPAGLIFLOZIN 25 MG PO TABS
25 mg | ORAL_TABLET | Freq: Every morning | ORAL | 1 refills | Status: AC
Start: 2018-07-07 — End: ?

## 2018-07-07 NOTE — Telephone Encounter
Called patient regarding denial of Rybelsus. Due to denial and his preference to avoid injectables will try SGLT-2 inhibitor instead. We discussed potential side effects.    1. Uncontrolled type 2 diabetes mellitus with hyperglycemia (HCC/RAF)  - empagliflozin (JARDIANCE) 25 mg tablet; Take 1 tablet (25 mg total) by mouth every morning.  Dispense: 90 tablet; Refill: 1

## 2018-07-07 NOTE — Addendum Note
Addended by: Benny Lennert on: 07/07/2018 10:18 AM     Modules accepted: Orders

## 2018-07-07 NOTE — Telephone Encounter
PA denied. Please advise. Thank you!

## 2018-07-09 ENCOUNTER — Other Ambulatory Visit: Payer: Self-pay | Admitting: Family Medicine

## 2018-07-12 MED FILL — PREGABALIN 150 MG CAPS: 150 | 30 days supply | Qty: 90 | Fill #1

## 2018-07-13 ENCOUNTER — Encounter: Payer: Self-pay | Admitting: Family Medicine

## 2018-07-13 ENCOUNTER — Ambulatory Visit (INDEPENDENT_AMBULATORY_CARE_PROVIDER_SITE_OTHER): Payer: No Typology Code available for payment source | Admitting: Family Medicine

## 2018-07-13 ENCOUNTER — Other Ambulatory Visit: Payer: Self-pay

## 2018-07-13 DIAGNOSIS — S70362A Insect bite (nonvenomous), left thigh, initial encounter: Secondary | ICD-10-CM

## 2018-07-13 DIAGNOSIS — W57XXXA Bitten or stung by nonvenomous insect and other nonvenomous arthropods, initial encounter: Secondary | ICD-10-CM

## 2018-07-13 MED ORDER — DOXYCYCLINE HYCLATE 100 MG PO TABS: 100.0000 mg | ORAL_TABLET | Freq: Two times a day (BID) | ORAL | 0 refills | Status: DC

## 2018-07-13 NOTE — Progress Notes (Signed)
Virtual Visit via Telephone Note  I connected with Derek [name] on 07/13/18 at 12:00 PM EDT by telephone and verified that I am speaking with the correct person using two identifiers.   Pt location: at home  Physician location: in office, [place], Vic Blackbird MD  I discussed the limitations, risks, security and privacy concerns of performing an evaluation and management service by telephone and the availability of in person appointments. I also discussed with the patient that there may be a patient responsible charge related to this service. The patient expressed understanding and agreed to proceed.   History of Present Illness:  Attempted web ex but pt could not get video to connect therefore turned into audio visit   Last Saturday he pulled a tick off left thigh, he was out in the garden. He has a red area about quarter size, he has been using ETOH and antibiotic ointment on it. No fever, no joint pain. Areas is not getting smaller also has black scab at center, non tender but itching.  He has had some mild headaches, a little fatigue  No URI symptoms    Observations/Objective: Unable to get webex working   Assessment and Plan: Tick Bite- will cover with doxycycline, he cn also use hydrocortisone for itch Discussed red flags, fever, worsening rash, joint pain or swelling   Follow Up Instructions:    I discussed the assessment and treatment plan with the patient. The patient was provided an opportunity to ask questions and all were answered. The patient agreed with the plan and demonstrated an understanding of the instructions.   The patient was advised to call back or seek an in-person evaluation if the symptoms worsen or if the condition fails to improve as anticipated.  I provided 8 minutes of non-face-to-face time during this encounter. 12:08pm  Vic Blackbird, MD

## 2018-07-15 MED FILL — ZOLPIDEM TARTRATE 10 MG TAB: 10 | 30 days supply | Qty: 30 | Fill #2

## 2018-07-15 MED FILL — LINZESS 145 MCG CAPSULE: 145 | 30 days supply | Qty: 30 | Fill #0

## 2018-07-18 ENCOUNTER — Encounter: Payer: Self-pay | Admitting: Family Medicine

## 2018-07-25 ENCOUNTER — Encounter: Payer: Self-pay | Admitting: Family Medicine

## 2018-08-09 ENCOUNTER — Other Ambulatory Visit: Payer: Self-pay | Admitting: Family Medicine

## 2018-08-09 MED FILL — EZETIMIBE 10 MG TABS: 10 | 90 days supply | Qty: 90 | Fill #0

## 2018-08-09 MED FILL — PREGABALIN 150 MG CAPS: 150 | 30 days supply | Qty: 90 | Fill #2

## 2018-08-10 ENCOUNTER — Other Ambulatory Visit: Payer: Self-pay | Admitting: Family Medicine

## 2018-08-13 ENCOUNTER — Other Ambulatory Visit: Payer: Self-pay | Admitting: Family Medicine

## 2018-08-17 ENCOUNTER — Other Ambulatory Visit: Payer: Self-pay | Admitting: Family Medicine

## 2018-08-17 MED FILL — PANTOPRAZOLE SOD DR 40 MG T: 40 | 90 days supply | Qty: 180 | Fill #0

## 2018-08-18 ENCOUNTER — Other Ambulatory Visit: Payer: Self-pay | Admitting: Family Medicine

## 2018-08-18 NOTE — Telephone Encounter (Signed)
Ok to refill??  Last office visit 07/13/2018.  Last refill 05/17/2018, #2 refills.

## 2018-08-19 MED FILL — ZOLPIDEM TARTRATE 10 MG TAB: 10 | 30 days supply | Qty: 30 | Fill #0

## 2018-08-30 MED FILL — CYCLOBENZAPRINE HCL 5 MG TA: 5 | 30 days supply | Qty: 90 | Fill #1

## 2018-08-31 ENCOUNTER — Other Ambulatory Visit: Payer: Self-pay | Admitting: Family Medicine

## 2018-08-31 ENCOUNTER — Encounter: Payer: Self-pay | Admitting: Family Medicine

## 2018-08-31 MED ORDER — PANTOPRAZOLE SODIUM 40 MG PO TBEC: 40.0000 mg | DELAYED_RELEASE_TABLET | Freq: Two times a day (BID) | ORAL | 2 refills | Status: DC

## 2018-09-16 MED FILL — ZOLPIDEM TARTRATE 10 MG TAB: 10 | 30 days supply | Qty: 30 | Fill #1

## 2018-09-22 ENCOUNTER — Other Ambulatory Visit: Payer: Self-pay

## 2018-09-22 ENCOUNTER — Encounter: Payer: Self-pay | Admitting: Family Medicine

## 2018-09-22 ENCOUNTER — Ambulatory Visit (INDEPENDENT_AMBULATORY_CARE_PROVIDER_SITE_OTHER): Payer: No Typology Code available for payment source | Admitting: Family Medicine

## 2018-09-22 VITALS — BP 124/78 | HR 61 | Temp 98.1°F | Resp 18 | Ht 72.0 in | Wt 241.4 lb

## 2018-09-22 DIAGNOSIS — Z0001 Encounter for general adult medical examination with abnormal findings: Secondary | ICD-10-CM

## 2018-09-22 DIAGNOSIS — E782 Mixed hyperlipidemia: Secondary | ICD-10-CM | POA: Diagnosis not present

## 2018-09-22 DIAGNOSIS — Z125 Encounter for screening for malignant neoplasm of prostate: Secondary | ICD-10-CM

## 2018-09-22 DIAGNOSIS — Z0000 Encounter for general adult medical examination without abnormal findings: Secondary | ICD-10-CM
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
## 2018-09-22 NOTE — Patient Instructions (Signed)
Can return in one year with PCP for next annual physical  Health Maintenance, Male A healthy lifestyle and preventive care is important for your health and wellness. Ask your health care provider about what schedule of regular examinations is right for you. What should I know about weight and diet? Eat a Healthy Diet  Eat plenty of vegetables, fruits, whole grains, low-fat dairy products, and lean protein.  Do not eat a lot of foods high in solid fats, added sugars, or salt.  Maintain a Healthy Weight Regular exercise can help you achieve or maintain a healthy weight. You should:  Do at least 150 minutes of exercise each week. The exercise should increase your heart rate and make you sweat (moderate-intensity exercise).  Do strength-training exercises at least twice a week. Watch Your Levels of Cholesterol and Blood Lipids  Have your blood tested for lipids and cholesterol every 5 years starting at 60 years of age. If you are at high risk for heart disease, you should start having your blood tested when you are 60 years old. You may need to have your cholesterol levels checked more often if: ? Your lipid or cholesterol levels are high. ? You are older than 60 years of age. ? You are at high risk for heart disease. What should I know about cancer screening? Many types of cancers can be detected early and may often be prevented. Lung Cancer  You should be screened every year for lung cancer if: ? You are a current smoker who has smoked for at least 30 years. ? You are a former smoker who has quit within the past 15 years.  Talk to your health care provider about your screening options, when you should start screening, and how often you should be screened. Colorectal Cancer  Routine colorectal cancer screening usually begins at 60 years of age and should be repeated every 5-10 years until you are 60 years old. You may need to be screened more often if early forms of precancerous polyps  or small growths are found. Your health care provider may recommend screening at an earlier age if you have risk factors for colon cancer.  Your health care provider may recommend using home test kits to check for hidden blood in the stool.  A small camera at the end of a tube can be used to examine your colon (sigmoidoscopy or colonoscopy). This checks for the earliest forms of colorectal cancer. Prostate and Testicular Cancer  Depending on your age and overall health, your health care provider may do certain tests to screen for prostate and testicular cancer.  Talk to your health care provider about any symptoms or concerns you have about testicular or prostate cancer. Skin Cancer  Check your skin from head to toe regularly.  Tell your health care provider about any new moles or changes in moles, especially if: ? There is a change in a mole's size, shape, or color. ? You have a mole that is larger than a pencil eraser.  Always use sunscreen. Apply sunscreen liberally and repeat throughout the day.  Protect yourself by wearing long sleeves, pants, a wide-brimmed hat, and sunglasses when outside. What should I know about heart disease, diabetes, and high blood pressure?  If you are 70-38 years of age, have your blood pressure checked every 3-5 years. If you are 45 years of age or older, have your blood pressure checked every year. You should have your blood pressure measured twice-once when you are at a  hospital or clinic, and once when you are not at a hospital or clinic. Record the average of the two measurements. To check your blood pressure when you are not at a hospital or clinic, you can use: ? An automated blood pressure machine at a pharmacy. ? A home blood pressure monitor.  Talk to your health care provider about your target blood pressure.  If you are between 34-64 years old, ask your health care provider if you should take aspirin to prevent heart disease.  Have regular  diabetes screenings by checking your fasting blood sugar level. ? If you are at a normal weight and have a low risk for diabetes, have this test once every three years after the age of 72. ? If you are overweight and have a high risk for diabetes, consider being tested at a younger age or more often.  A one-time screening for abdominal aortic aneurysm (AAA) by ultrasound is recommended for men aged 38-75 years who are current or former smokers. What should I know about preventing infection? Hepatitis B If you have a higher risk for hepatitis B, you should be screened for this virus. Talk with your health care provider to find out if you are at risk for hepatitis B infection. Hepatitis C Blood testing is recommended for:  Everyone born from [birth year] through 1967.  Anyone with known risk factors for hepatitis C. Sexually Transmitted Diseases (STDs)  You should be screened each year for STDs including gonorrhea and chlamydia if: ? You are sexually active and are younger than 60 years of age. ? You are older than 60 years of age and your health care provider tells you that you are at risk for this type of infection. ? Your sexual activity has changed since you were last screened and you are at an increased risk for chlamydia or gonorrhea. Ask your health care provider if you are at risk.  Talk with your health care provider about whether you are at high risk of being infected with HIV. Your health care provider may recommend a prescription medicine to help prevent HIV infection. What else can I do?  Schedule regular health, dental, and eye exams.  Stay current with your vaccines (immunizations).  Do not use any tobacco products, such as cigarettes, chewing tobacco, and e-cigarettes. If you need help quitting, ask your health care provider.  Limit alcohol intake to no more than 2 drinks per day. One drink equals 12 ounces of beer, 5 ounces of wine, or 1 ounces of hard liquor.  Do not use  street drugs.  Do not share needles.  Ask your health care provider for help if you need support or information about quitting drugs.  Tell your health care provider if you often feel depressed.  Tell your health care provider if you have ever been abused or do not feel safe at home. This information is not intended to replace advice given to you by your health care provider. Make sure you discuss any questions you have with your health care provider. Document Released: 09/19/2007 Document Revised: 11/20/2015 Document Reviewed: 12/25/2014 Elsevier Interactive Patient Education  2019 [employer].

## 2018-09-23 ENCOUNTER — Other Ambulatory Visit: Payer: Self-pay | Admitting: Family Medicine

## 2018-09-23 LAB — COMPLETE METABOLIC PANEL WITH GFR
AG Ratio: 2.3 (calc) (ref 1.0–2.5)
ALT: 31 U/L (ref 9–46)
AST: 27 U/L (ref 10–35)
Albumin: 4.8 g/dL (ref 3.6–5.1)
Alkaline phosphatase (APISO): 61 U/L (ref 35–144)
BUN: 16 mg/dL (ref 7–25)
CO2: 29 mmol/L (ref 20–32)
Calcium: 9.9 mg/dL (ref 8.6–10.3)
Chloride: 103 mmol/L (ref 98–110)
Creat: 1.14 mg/dL (ref 0.70–1.33)
GFR, Est African American: 81 mL/min/{1.73_m2} (ref >=60)
GFR, Est Non African American: 70 mL/min/{1.73_m2} (ref >=60)
Globulin: 2.1 g/dL (calc) (ref 1.9–3.7)
Glucose, Bld: 92 mg/dL (ref 65–99)
Potassium: 4.7 mmol/L (ref 3.5–5.3)
Sodium: 143 mmol/L (ref 135–146)
Total Bilirubin: 0.5 mg/dL (ref 0.2–1.2)
Total Protein: 6.9 g/dL (ref 6.1–8.1)

## 2018-09-23 LAB — CBC WITH DIFFERENTIAL/PLATELET
Absolute Monocytes: 530 cells/uL (ref 200–950)
Basophils Absolute: 42 cells/uL (ref 0–200)
Basophils Relative: 0.8 %
Eosinophils Absolute: 99 cells/uL (ref 15–500)
Eosinophils Relative: 1.9 %
HCT: 48.3 % (ref 38.5–50.0)
Hemoglobin: 16.3 g/dL (ref 13.2–17.1)
Lymphs Abs: 1815 cells/uL (ref 850–3900)
MCH: 31.8 pg (ref 27.0–33.0)
MCHC: 33.7 g/dL (ref 32.0–36.0)
MCV: 94.2 fL (ref 80.0–100.0)
MPV: 10.3 fL (ref 7.5–12.5)
Monocytes Relative: 10.2 %
Neutro Abs: 2714 cells/uL (ref 1500–7800)
Neutrophils Relative %: 52.2 %
Platelets: 209 10*3/uL (ref 140–400)
RBC: 5.13 10*6/uL (ref 4.20–5.80)
RDW: 13.2 % (ref 11.0–15.0)
Total Lymphocyte: 34.9 %
WBC: 5.2 10*3/uL (ref 3.8–10.8)

## 2018-09-23 LAB — LIPID PANEL
Cholesterol: 225 mg/dL — ABNORMAL HIGH (ref <200)
HDL: 49 mg/dL (ref >=40)
LDL Cholesterol (Calc): 146 mg/dL (calc) — ABNORMAL HIGH
Non-HDL Cholesterol (Calc): 176 mg/dL (calc) — ABNORMAL HIGH (ref <130)
Total CHOL/HDL Ratio: 4.6 (calc) (ref <5.0)
Triglycerides: 164 mg/dL — ABNORMAL HIGH (ref <150)

## 2018-09-23 LAB — HEMOGLOBIN A1C
Hgb A1c MFr Bld: 5.3 % of total Hgb (ref <5.7)
Mean Plasma Glucose: 105 (calc)
eAG (mmol/L): 5.8 (calc)

## 2018-09-23 LAB — PSA: PSA: 0.4 ng/mL (ref <=4.0)

## 2018-09-23 LAB — TSH: TSH: 1.54 mIU/L (ref 0.40–4.50)

## 2018-09-23 MED ORDER — ATORVASTATIN CALCIUM 20 MG PO TABS: 20.0000 mg | ORAL_TABLET | Freq: Every day | ORAL | 3 refills | Status: DC

## 2018-09-23 MED FILL — CYCLOBENZAPRINE HCL 5 MG TA: 5 | 30 days supply | Qty: 90 | Fill #2

## 2018-09-26 ENCOUNTER — Telehealth: Payer: Self-pay

## 2018-09-26 MED ORDER — ATORVASTATIN CALCIUM 20 MG PO TABS: 20.0000 mg | ORAL_TABLET | Freq: Every day | ORAL | 0 refills | Status: DC

## 2018-09-26 MED ORDER — EZETIMIBE 10 MG PO TABS: 10.0000 mg | ORAL_TABLET | Freq: Every day | ORAL | 0 refills | Status: DC

## 2018-09-26 NOTE — Telephone Encounter (Signed)
Error

## 2018-10-14 MED FILL — ZOLPIDEM TARTRATE 10 MG TAB: 10 | 30 days supply | Qty: 30 | Fill #2

## 2018-10-20 MED FILL — EZETIMIBE 10 MG TABS: 10 | 90 days supply | Qty: 90 | Fill #0

## 2018-10-24 MED FILL — CYCLOBENZAPRINE HCL 5 MG TA: 5 | 30 days supply | Qty: 90 | Fill #0

## 2018-10-25 ENCOUNTER — Telehealth: Payer: Self-pay | Admitting: *Deleted

## 2018-10-25 NOTE — Telephone Encounter (Signed)
noted 

## 2018-10-25 NOTE — Telephone Encounter (Signed)
Received call from patient [phone]~ telephone.   Reports that he was seen at Dr. Rita Ohara ([facility]) for jaw pain. States that x-ray and CT were obtained under impression that he would need root canal, but no issues with dentition found. Reports that [facility] stated he has shingles in jaw and on face. Reports that he was given Amoxicillin for open ulcer in mouth.   States that his pain in jaw and face has worsened. Appointment scheduled for 10/26/2018.

## 2018-10-26 ENCOUNTER — Other Ambulatory Visit: Payer: Self-pay

## 2018-10-26 ENCOUNTER — Encounter: Payer: Self-pay | Admitting: Family Medicine

## 2018-10-26 ENCOUNTER — Ambulatory Visit (INDEPENDENT_AMBULATORY_CARE_PROVIDER_SITE_OTHER): Payer: No Typology Code available for payment source | Admitting: Family Medicine

## 2018-10-26 VITALS — BP 128/70 | HR 82 | Temp 98.2°F | Resp 12 | Ht 72.0 in | Wt 244.0 lb

## 2018-10-26 DIAGNOSIS — R519 Headache, unspecified: Secondary | ICD-10-CM

## 2018-10-26 DIAGNOSIS — R51 Headache: Secondary | ICD-10-CM | POA: Diagnosis not present

## 2018-10-26 MED ORDER — PREDNISONE 10 MG PO TABS: ORAL_TABLET | ORAL | 0 refills | Status: DC

## 2018-10-26 NOTE — Progress Notes (Signed)
   Subjective:    Patient ID: Derek [name], male    DOB: 1960/12/11, 58 y.o.   MRN: [id number]  Patient presents for Facial Pain (x1 week- L sided facial pain, slight edema- was seen by Dr Tarri Glenn and had x-ray/CT- was advised he had Shingles)  2 weeks ago was having left jaw/face pain, seen by dentist initialy felt he needed root canal and referred to oral surgeon. Last Thursday starting having severe pain that radiated to left eye and foreahead. Called dentist back told to take ibuprofen which helped a little, but pain returned over the weekend. Seen by Dr. Tarri Glenn, had imaing of face/jaw done, told nothing wrong with teeth, did not need root canal Told he had a blister in mouth, given amoxicillin Has eye pain where it shoots up from cheek, no change in vision   No sinus drainage, has had a little congestion   Review Of Systems: per above  GEN- denies fatigue, fever, weight loss,weakness, recent illness HEENT- denies eye drainage, change in vision, nasal discharge, CVS- denies chest pain, palpitations RESP- denies SOB, cough, wheeze ABD- denies N/V, change in stools, abd pain Neuro- +headache, denies dizziness, syncope, seizure activity       Objective:    BP 128/70   Pulse 82   Temp 98.2 F (36.8 C) (Oral)   Resp 12   Ht 6' (1.829 m)   Wt 244 lb (110.7 kg)   SpO2 97%   BMI 33.09 kg/m  GEN- NAD, alert and oriented x3 HEENT- PERRL, EOMI, non injected sclera, pink conjunctiva, MMM, oropharynx clear, no blistering lesions, nares clear, TM clear no effusion Neck- Supple, no thyromegaly, no LAD CVS- RRR, no murmur RESP-CTAB NEURO-CNII-XII in tact, face non tender to palpation        Assessment & Plan:      Problem List Items Addressed This Visit    None    Visit Diagnoses    Left facial pain    -  Primary   I dont see any lesions concerning for shingles, distribution of pain, DD Trigmenial neuralgia or arteritis- temp. sinusitis should clear with antibiotics but  does not have classic symptoms of drainage, pressure etc  at any rate he can continue the antibiotics, I will add prednisone taper to cover nerve inflammation and arteritis  check CRP/ERS/CBC today  If pain does not improve ENT may be next step   Relevant Orders   CBC with Differential/Platelet (Completed)   C-reactive protein (Completed)   Sedimentation Rate (Completed)      Note: This dictation was prepared with Dragon dictation along with smaller phrase technology. Any transcriptional errors that result from this process are unintentional.

## 2018-10-26 NOTE — Patient Instructions (Signed)
This does not look like classic shingles I am considering Trigeminal Neuralgia or temporal arteritis  The antibiotics will take care of any sinusitis  He will take prednisone taper  F/U pending results

## 2018-10-27 ENCOUNTER — Encounter: Payer: Self-pay | Admitting: Family Medicine

## 2018-10-27 LAB — CBC WITH DIFFERENTIAL/PLATELET
Absolute Monocytes: 589 cells/uL (ref 200–950)
Basophils Absolute: 38 cells/uL (ref 0–200)
Basophils Relative: 0.6 %
Eosinophils Absolute: 141 cells/uL (ref 15–500)
Eosinophils Relative: 2.2 %
HCT: 44.8 % (ref 38.5–50.0)
Hemoglobin: 16.1 g/dL (ref 13.2–17.1)
Lymphs Abs: 2061 cells/uL (ref 850–3900)
MCH: 32.1 pg (ref 27.0–33.0)
MCHC: 35.9 g/dL (ref 32.0–36.0)
MCV: 89.2 fL (ref 80.0–100.0)
MPV: 9.7 fL (ref 7.5–12.5)
Monocytes Relative: 9.2 %
Neutro Abs: 3571 cells/uL (ref 1500–7800)
Neutrophils Relative %: 55.8 %
Platelets: 239 10*3/uL (ref 140–400)
RBC: 5.02 10*6/uL (ref 4.20–5.80)
RDW: 13.4 % (ref 11.0–15.0)
Total Lymphocyte: 32.2 %
WBC: 6.4 10*3/uL (ref 3.8–10.8)

## 2018-10-27 LAB — SEDIMENTATION RATE: Sed Rate: 2 mm/h (ref 0–20)

## 2018-10-27 LAB — C-REACTIVE PROTEIN: CRP: 0.6 mg/L (ref <8.0)

## 2018-10-31 ENCOUNTER — Encounter: Payer: Self-pay | Admitting: Family Medicine

## 2018-10-31 MED ORDER — VALACYCLOVIR HCL 1 G PO TABS: 1000.0000 mg | ORAL_TABLET | Freq: Three times a day (TID) | ORAL | 0 refills | Status: AC

## 2018-11-09 ENCOUNTER — Telehealth: Payer: Self-pay | Admitting: *Deleted

## 2018-11-09 DIAGNOSIS — R519 Headache, unspecified: Secondary | ICD-10-CM

## 2018-11-09 DIAGNOSIS — B0229 Other postherpetic nervous system involvement: Secondary | ICD-10-CM

## 2018-11-09 MED FILL — PANTOPRAZOLE SOD DR 40 MG T: 40 | 90 days supply | Qty: 180 | Fill #1

## 2018-11-09 NOTE — Telephone Encounter (Signed)
Received call from patient [phone]~ telephone.   Reports that he has completed Valtrex and the blisters in his mouth are improved, but does states that he continues to have daily headaches, primarily on the L side of his head.   Per chart notes, if pain does not improve, patient will need ENT referral. Ok to place referral?

## 2018-11-09 NOTE — Telephone Encounter (Signed)
Since he had blisters consistent with shingles then he would need to see neurology instead for the headaches   Okay to refer neurology- Dx post herpetic neuralgia, headaches

## 2018-11-09 NOTE — Telephone Encounter (Signed)
Call placed to patient and patient made aware.   Agreeable to plan.   Referral orders sent.

## 2018-11-15 ENCOUNTER — Telehealth: Payer: Self-pay | Admitting: *Deleted

## 2018-11-15 NOTE — Telephone Encounter (Signed)
Received VM from patient. [phone]~ telephone.   Inquired as to status to neurology referral.   Please contact him to F/U.

## 2018-11-16 NOTE — Telephone Encounter (Signed)
Spoke with patient and informed him to contact [facility] as we sent his referral over to them last week on 11/09/2018. Patient verbalized understanding.

## 2018-11-17 MED FILL — CYCLOBENZAPRINE HCL 5 MG TA: 5 | 30 days supply | Qty: 90 | Fill #1

## 2018-11-26 ENCOUNTER — Other Ambulatory Visit: Payer: Self-pay | Admitting: Family Medicine

## 2018-11-28 MED FILL — ZOLPIDEM TARTRATE 10 MG TAB: 10 | 30 days supply | Qty: 30 | Fill #0

## 2018-11-28 NOTE — Telephone Encounter (Signed)
Ok to refill??  Last office visit 10/26/2018.  Last refill 08/19/2018, #2 refills.

## 2018-11-29 ENCOUNTER — Ambulatory Visit (INDEPENDENT_AMBULATORY_CARE_PROVIDER_SITE_OTHER): Payer: No Typology Code available for payment source

## 2018-11-29 ENCOUNTER — Other Ambulatory Visit: Payer: Self-pay

## 2018-11-29 DIAGNOSIS — Z23 Encounter for immunization: Secondary | ICD-10-CM | POA: Diagnosis not present

## 2018-11-29 NOTE — Progress Notes (Signed)
Patient came in today to receive his annual flu vaccine. Patient was given his fluarix in the left deltoid. He tolerated well. VIS given.

## 2018-11-30 ENCOUNTER — Ambulatory Visit (INDEPENDENT_AMBULATORY_CARE_PROVIDER_SITE_OTHER): Payer: No Typology Code available for payment source | Admitting: Neurology

## 2018-11-30 ENCOUNTER — Encounter: Payer: Self-pay | Admitting: Neurology

## 2018-11-30 ENCOUNTER — Telehealth: Payer: Self-pay | Admitting: Neurology

## 2018-11-30 VITALS — BP 148/88 | HR 88 | Temp 97.8°F | Ht 72.0 in | Wt 246.0 lb

## 2018-11-30 DIAGNOSIS — R519 Headache, unspecified: Secondary | ICD-10-CM | POA: Insufficient documentation

## 2018-11-30 DIAGNOSIS — R51 Headache: Secondary | ICD-10-CM

## 2018-11-30 MED ORDER — DIAZEPAM 5 MG PO TABS: 5.0000 mg | ORAL_TABLET | ORAL | 0 refills | Status: DC | PRN

## 2018-11-30 MED ORDER — TOPIRAMATE 100 MG PO TABS: 100.0000 mg | ORAL_TABLET | Freq: Two times a day (BID) | ORAL | 5 refills | Status: DC

## 2018-11-30 MED ORDER — BUTALBITAL-APAP-CAFFEINE 50-325-40 MG PO TABS: 1.0000 | ORAL_TABLET | Freq: Four times a day (QID) | ORAL | 3 refills | Status: DC | PRN

## 2018-11-30 NOTE — Telephone Encounter (Signed)
[facility] auth: [id number] 9exp. 12/01/18 to 12/31/18)/medicare order faxed to [facility] for open MRI they will reach out to the patient to schedule.
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
## 2018-12-01 DIAGNOSIS — G4489 Other headache syndrome: Secondary | ICD-10-CM | POA: Diagnosis not present

## 2018-12-01 NOTE — Telephone Encounter (Signed)
[facility] just called informing me that [facility] is not in their network and that he would have to go somewhere that is in his network. I switched the location to GI. Alesia from [facility] stating she was going to call the patient and inform him of this that [facility] is not in their network and he would have to go somewhere else.

## 2018-12-06 ENCOUNTER — Telehealth: Payer: Self-pay | Admitting: Neurology

## 2018-12-06 NOTE — Telephone Encounter (Signed)
Please call patient, MRI of the brain was normal 

## 2018-12-06 NOTE — Telephone Encounter (Signed)
I was able to speak with the patient and he has been notified of his MRI results.  He verbalized understanding.    He was unable to tolerate topiramate 100mg  BID.  He is taking 1.5 tablets at bedtime.  He will attempt to work up to the prescribed instructions, as tolerated.

## 2018-12-06 NOTE — Telephone Encounter (Signed)
Pt is asking for be called with the results to his MRI when results are available

## 2018-12-17 MED FILL — CYCLOBENZAPRINE HCL 5 MG TA: 5 | 30 days supply | Qty: 90 | Fill #2

## 2018-12-26 MED FILL — ZOLPIDEM TARTRATE 10 MG TAB: 10 | 30 days supply | Qty: 30 | Fill #1

## 2018-12-27 ENCOUNTER — Ambulatory Visit: Payer: No Typology Code available for payment source | Admitting: Diagnostic Neuroimaging

## 2018-12-27 ENCOUNTER — Encounter

## 2019-01-11 ENCOUNTER — Other Ambulatory Visit: Payer: Self-pay

## 2019-01-11 ENCOUNTER — Ambulatory Visit (INDEPENDENT_AMBULATORY_CARE_PROVIDER_SITE_OTHER): Payer: No Typology Code available for payment source | Admitting: Neurology

## 2019-01-11 ENCOUNTER — Encounter: Payer: Self-pay | Admitting: Neurology

## 2019-01-11 VITALS — BP 142/82 | HR 83 | Temp 97.7°F | Ht 72.0 in | Wt 239.6 lb

## 2019-01-11 DIAGNOSIS — R519 Headache, unspecified: Secondary | ICD-10-CM
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
## 2019-01-11 NOTE — Patient Instructions (Signed)
1. You may try to decrease your dose of Topamax taking 50 mg at bedtime, take this for at least a few weeks to see if your pain increases  2. Return here in 6 months

## 2019-01-12 MED FILL — EZETIMIBE 10 MG TABS: 10 | 90 days supply | Qty: 90 | Fill #0

## 2019-01-16 MED FILL — CYCLOBENZAPRINE HCL 5 MG TA: 5 | 30 days supply | Qty: 90 | Fill #0

## 2019-01-16 NOTE — Progress Notes (Signed)
I have reviewed and agreed above plan. 

## 2019-01-21 MED FILL — TOPIRAMATE 100 MG TABLET: 100 | 30 days supply | Qty: 60 | Fill #0

## 2019-01-25 ENCOUNTER — Other Ambulatory Visit: Payer: Self-pay | Admitting: Family Medicine

## 2019-01-25 MED FILL — PREGABALIN 150 MG CAPS: 150 | 30 days supply | Qty: 90 | Fill #0

## 2019-01-25 MED FILL — ZOLPIDEM TARTRATE 10 MG TAB: 10 | 30 days supply | Qty: 30 | Fill #2

## 2019-02-02 ENCOUNTER — Other Ambulatory Visit: Payer: Self-pay

## 2019-02-03 ENCOUNTER — Telehealth: Payer: Self-pay | Admitting: *Deleted

## 2019-02-03 ENCOUNTER — Encounter: Payer: Self-pay | Admitting: Family Medicine

## 2019-02-03 ENCOUNTER — Ambulatory Visit (INDEPENDENT_AMBULATORY_CARE_PROVIDER_SITE_OTHER): Payer: No Typology Code available for payment source | Admitting: Family Medicine

## 2019-02-03 VITALS — BP 138/86 | HR 70 | Temp 98.9°F | Resp 14 | Ht 72.0 in | Wt 227.0 lb

## 2019-02-03 DIAGNOSIS — E6609 Other obesity due to excess calories: Secondary | ICD-10-CM | POA: Diagnosis not present

## 2019-02-03 DIAGNOSIS — K5900 Constipation, unspecified: Secondary | ICD-10-CM | POA: Diagnosis not present

## 2019-02-03 DIAGNOSIS — K219 Gastro-esophageal reflux disease without esophagitis: Secondary | ICD-10-CM

## 2019-02-03 DIAGNOSIS — Z6830 Body mass index (BMI) 30.0-30.9, adult: Secondary | ICD-10-CM

## 2019-02-03 DIAGNOSIS — E781 Pure hyperglyceridemia: Secondary | ICD-10-CM

## 2019-02-03 DIAGNOSIS — F5101 Primary insomnia: Secondary | ICD-10-CM

## 2019-02-03 DIAGNOSIS — M5136 Other intervertebral disc degeneration, lumbar region: Secondary | ICD-10-CM

## 2019-02-03 MED ORDER — SHINGRIX 50 MCG/0.5ML IM SUSR: 0.5000 mL | Freq: Once | INTRAMUSCULAR | 1 refills | Status: AC

## 2019-02-03 NOTE — Assessment & Plan Note (Signed)
Continue Linzess. 

## 2019-02-03 NOTE — Patient Instructions (Signed)
F/U 6 months for Physical  We will call with lab results  

## 2019-02-03 NOTE — Assessment & Plan Note (Signed)
He is doing well with his weight loss.  Continue with the weight watchers program.

## 2019-02-03 NOTE — Assessment & Plan Note (Signed)
Continue Ambien. 

## 2019-02-03 NOTE — Assessment & Plan Note (Signed)
Symptoms controlled with Protonix continues to benefit from this.

## 2019-02-03 NOTE — Assessment & Plan Note (Signed)
He is on Zetia.  He is also working on significant dietary changes and weight loss.  We will recheck his lipid panel and liver function test.

## 2019-02-03 NOTE — Assessment & Plan Note (Signed)
Chronic back pain with multiple surgeries.  He is on multiple meds for his pain Flexeril Lyrica Requip also has hydrocodone on hand but does not take this daily.

## 2019-02-03 NOTE — Progress Notes (Signed)
   Subjective:    Patient ID: ROMMEL PANDA, male    DOB: Sep 28, 1960, 58 y.o.   MRN: [id number]  Patient presents for Follow-up (is fasting)   Pt here to f/u chronic medical probllems medications reviewed.   He is back on weight watchers, down almost 20lbs   Hyperlipidemia-taking zetia, did not tolerate the lipitor due to joint pain   Migraines- taking Topamax  100mg  at bedtime is followed by neurology.     Chronic pain- takes norco as needed, taking flexeril most days, taking lyrica BID    Chronic insomnia- taking ambien    GERD- protonix taking daily      Chronic constipaton- taking linzess down daily previously as needed  Review Of Systems:  GEN- denies fatigue, fever, weight loss,weakness, recent illness HEENT- denies eye drainage, change in vision, nasal discharge, CVS- denies chest pain, palpitations RESP- denies SOB, cough, wheeze ABD- denies N/V, change in stools, abd pain GU- denies dysuria, hematuria, dribbling, incontinence MSK- denies joint pain, muscle aches, injury Neuro- denies headache, dizziness, syncope, seizure activity       Objective:    BP 138/86   Pulse 70   Temp 98.9 F (37.2 C) (Temporal)   Resp 14   Ht 6' (1.829 m)   Wt 227 lb (103 kg)   SpO2 100%   BMI 30.79 kg/m  GEN- NAD, alert and oriented x3 HEENT- PERRL, EOMI, non injected sclera, pink conjunctiva, MMM, oropharynx clear Neck- Supple, no thyromegaly CVS- RRR, no murmur RESP-CTAB ABD-NABS,soft,NT,ND EXT- No edema Pulses- Radial, DP- 2+        Assessment & Plan:    Requested shingles vaccine this will be sent to his pharmacy. Problem List Items Addressed This Visit      Unprioritized   Constipation    Continue Linzess.      Degenerative disc disease, lumbar    Chronic back pain with multiple surgeries.  He is on multiple meds for his pain Flexeril Lyrica Requip also has hydrocodone on hand but does not take this daily.      GERD (gastroesophageal reflux disease)  - Primary    Symptoms controlled with Protonix continues to benefit from this.      Hypertriglyceridemia    He is on Zetia.  He is also working on significant dietary changes and weight loss.  We will recheck his lipid panel and liver function test.      Relevant Orders   Comprehensive metabolic panel   CBC with Differential/Platelet   Lipid panel   Insomnia    Continue Ambien      Obesity    He is doing well with his weight loss.  Continue with the weight watchers program.      Relevant Orders   Comprehensive metabolic panel   CBC with Differential/Platelet   Lipid panel      Note: This dictation was prepared with Dragon dictation along with smaller phrase technology. Any transcriptional errors that result from this process are unintentional.

## 2019-02-03 NOTE — Telephone Encounter (Signed)
-----   Message from Alycia Rossetti, MD sent at 02/03/2019  9:23 AM EDT ----- Regarding: Send shingrix to pharmacy

## 2019-02-04 LAB — LIPID PANEL
Cholesterol: 144 mg/dL (ref <200)
HDL: 37 mg/dL — ABNORMAL LOW (ref >=40)
LDL Cholesterol (Calc): 88 mg/dL (calc)
Non-HDL Cholesterol (Calc): 107 mg/dL (calc) (ref <130)
Total CHOL/HDL Ratio: 3.9 (calc) (ref <5.0)
Triglycerides: 94 mg/dL (ref <150)

## 2019-02-04 LAB — CBC WITH DIFFERENTIAL/PLATELET
Absolute Monocytes: 470 cells/uL (ref 200–950)
Basophils Absolute: 50 cells/uL (ref 0–200)
Basophils Relative: 1 %
Eosinophils Absolute: 180 cells/uL (ref 15–500)
Eosinophils Relative: 3.6 %
HCT: 47.1 % (ref 38.5–50.0)
Hemoglobin: 15.7 g/dL (ref 13.2–17.1)
Lymphs Abs: 1845 cells/uL (ref 850–3900)
MCH: 31 pg (ref 27.0–33.0)
MCHC: 33.3 g/dL (ref 32.0–36.0)
MCV: 93.1 fL (ref 80.0–100.0)
MPV: 10.3 fL (ref 7.5–12.5)
Monocytes Relative: 9.4 %
Neutro Abs: 2455 cells/uL (ref 1500–7800)
Neutrophils Relative %: 49.1 %
Platelets: 226 10*3/uL (ref 140–400)
RBC: 5.06 10*6/uL (ref 4.20–5.80)
RDW: 11.8 % (ref 11.0–15.0)
Total Lymphocyte: 36.9 %
WBC: 5 10*3/uL (ref 3.8–10.8)

## 2019-02-04 LAB — COMPREHENSIVE METABOLIC PANEL
AG Ratio: 2.5 (calc) (ref 1.0–2.5)
ALT: 35 U/L (ref 9–46)
AST: 28 U/L (ref 10–35)
Albumin: 4.7 g/dL (ref 3.6–5.1)
Alkaline phosphatase (APISO): 79 U/L (ref 35–144)
BUN: 21 mg/dL (ref 7–25)
CO2: 25 mmol/L (ref 20–32)
Calcium: 9.7 mg/dL (ref 8.6–10.3)
Chloride: 109 mmol/L (ref 98–110)
Creat: 1.11 mg/dL (ref 0.70–1.33)
Globulin: 1.9 g/dL (calc) (ref 1.9–3.7)
Glucose, Bld: 94 mg/dL (ref 65–99)
Potassium: 4.6 mmol/L (ref 3.5–5.3)
Sodium: 142 mmol/L (ref 135–146)
Total Bilirubin: 0.4 mg/dL (ref 0.2–1.2)
Total Protein: 6.6 g/dL (ref 6.1–8.1)

## 2019-02-06 ENCOUNTER — Encounter: Payer: Self-pay | Admitting: Family Medicine

## 2019-02-07 ENCOUNTER — Ambulatory Visit: Payer: No Typology Code available for payment source | Admitting: Family Medicine

## 2019-02-07 ENCOUNTER — Ambulatory Visit: Payer: Self-pay | Admitting: Family Medicine

## 2019-02-07 MED FILL — PANTOPRAZOLE SOD DR 40 MG T: 40 | 90 days supply | Qty: 180 | Fill #2

## 2019-02-10 MED FILL — CYCLOBENZAPRINE HCL 5 MG TA: 5 | 30 days supply | Qty: 90 | Fill #1

## 2019-02-14 MED FILL — TOPIRAMATE 100 MG TABLET: 100 | 30 days supply | Qty: 60 | Fill #1

## 2019-02-15 MED ORDER — JARDIANCE 25 MG PO TABS
25 mg | ORAL_TABLET | Freq: Every morning | ORAL | 0 refills | Status: AC
Start: 2019-02-15 — End: ?

## 2019-02-23 MED FILL — PREGABALIN 150 MG CAPS: 150 | 30 days supply | Qty: 90 | Fill #0

## 2019-02-24 ENCOUNTER — Other Ambulatory Visit: Payer: Self-pay | Admitting: Family Medicine

## 2019-02-24 MED FILL — ZOLPIDEM TARTRATE 10 MG TAB: 10 | 30 days supply | Qty: 30 | Fill #0

## 2019-02-24 NOTE — Telephone Encounter (Signed)
Ok to refill??  Last office visit 02/03/2019.  Last refill 11/28/2018, #2 refills.

## 2019-03-13 MED FILL — CYCLOBENZAPRINE HCL 5 MG TA: 5 | 30 days supply | Qty: 90 | Fill #0

## 2019-03-16 MED FILL — TOPIRAMATE 100 MG TABLET: 100 | 30 days supply | Qty: 60 | Fill #2

## 2019-03-20 ENCOUNTER — Encounter: Payer: Self-pay | Admitting: Family Medicine

## 2019-03-20 ENCOUNTER — Ambulatory Visit (INDEPENDENT_AMBULATORY_CARE_PROVIDER_SITE_OTHER): Payer: No Typology Code available for payment source | Admitting: Family Medicine

## 2019-03-20 ENCOUNTER — Other Ambulatory Visit: Payer: Self-pay

## 2019-03-20 ENCOUNTER — Ambulatory Visit: Payer: No Typology Code available for payment source | Admitting: Family Medicine

## 2019-03-20 VITALS — BP 122/84 | HR 88 | Temp 98.3°F | Resp 18 | Wt 222.0 lb

## 2019-03-20 DIAGNOSIS — Z9889 Other specified postprocedural states: Secondary | ICD-10-CM | POA: Diagnosis not present

## 2019-03-20 DIAGNOSIS — R109 Unspecified abdominal pain: Secondary | ICD-10-CM

## 2019-03-20 LAB — URINALYSIS, ROUTINE W REFLEX MICROSCOPIC
Bacteria, UA: NONE SEEN /HPF
Bilirubin Urine: NEGATIVE
Glucose, UA: NEGATIVE
Hgb urine dipstick: NEGATIVE
Hyaline Cast: NONE SEEN /LPF
Leukocytes,Ua: NEGATIVE
Nitrite: NEGATIVE
RBC / HPF: NONE SEEN /HPF (ref 0–2)
Specific Gravity, Urine: 1.025 (ref 1.001–1.03)
Squamous Epithelial / HPF: NONE SEEN /HPF (ref <=5)
WBC, UA: NONE SEEN /HPF (ref 0–5)
pH: 7 (ref 5.0–8.0)

## 2019-03-20 LAB — MICROSCOPIC MESSAGE

## 2019-03-20 MED ORDER — CIPROFLOXACIN HCL 500 MG PO TABS: 500.0000 mg | ORAL_TABLET | Freq: Two times a day (BID) | ORAL | 0 refills | Status: DC

## 2019-03-20 MED ORDER — LYRICA 150 MG PO CAPS: 150.0000 mg | ORAL_CAPSULE | Freq: Two times a day (BID) | ORAL | 2 refills | Status: DC

## 2019-03-20 NOTE — Patient Instructions (Addendum)
Start antibiotics  We will call with lab results F/U as previous

## 2019-03-20 NOTE — Assessment & Plan Note (Signed)
I will obtain the last note from the [facility].  I will go ahead and fill his Lyrica since they are not doing any active intervention at this time.

## 2019-03-20 NOTE — Progress Notes (Signed)
   Subjective:    Patient ID: Derek [name], male    DOB: 1960/05/03, 58 y.o.   MRN: [id number]  Patient presents for Pain in left back/side  Pt here with left flank pain for the past for 3 weeks, has aching sensation, that feels different from his typical back pain.  no pain with urination, he has noticed a difference in the pressure and flow.  Not noted any hematuria no odor but he has beea little darker yellow   No fever , no vomiting   uses linzess as needed for his constipation  Medications reviewed  He also asked if I would start feeling his Lyrica.  He has been getting this from Dr. Patrice Paradise for the past few years he has an office visit every couple months just for medication refill.  He is not on any other narcotic medications.  Review Of Systems:  GEN- denies fatigue, fever, weight loss,weakness, recent illness HEENT- denies eye drainage, change in vision, nasal discharge, CVS- denies chest pain, palpitations RESP- denies SOB, cough, wheeze ABD- denies N/V, change in stools, +abd pain GU- denies dysuria, hematuria, dribbling, incontinence MSK- denies joint pain, muscle aches, injury Neuro- denies headache, dizziness, syncope, seizure activity       Objective:    BP 122/84   Pulse 88   Temp 98.3 F (36.8 C) (Temporal)   Resp 18   Wt 222 lb (100.7 kg)   SpO2 99%   BMI 30.11 kg/m  GEN- NAD, alert and oriented x3, well-appearing HEENT- PERRL, EOMI, non injected sclera, pink conjunctiva, MMM, oropharynx clear CVS- RRR, no murmur RESP-CTAB ABD-NABS,soft,NT,ND, no CVA tenderness MSK spine nontender fair range of motion, no spasm in the paraspinal EXT- No edema Pulses- Radial 2+        Assessment & Plan:      Problem List Items Addressed This Visit    None    Visit Diagnoses    Left flank pain    -  Primary   Left flank pain differential diagnosis urinary tract versus musculoskeletal pain.  He has had multiple interventions on his back this feels different  from his typical back pain.  He has had some changes in his urinary pattern.  At this point I will go ahead and start him on ciprofloxacin so that we can cover the upper tract he does only have 1 functioning kidney.  We will send his urine off for culture.  Advised him to hydrate more as he did have some ketones in the urine but no glucose.  He had recent labs done at the end of October which were unremarkable.  If pain worsens he needs imaging of that left kidney   Relevant Orders   Urine Culture   Urinalysis, Routine w reflex microscopic (Completed)      Note: This dictation was prepared with Dragon dictation along with smaller phrase technology. Any transcriptional errors that result from this process are unintentional.

## 2019-03-21 LAB — URINE CULTURE
MICRO NUMBER:: 1194616
Result:: NO GROWTH
SPECIMEN QUALITY:: ADEQUATE

## 2019-03-22 ENCOUNTER — Encounter: Payer: Self-pay | Admitting: Family Medicine

## 2019-03-24 ENCOUNTER — Ambulatory Visit (HOSPITAL_COMMUNITY)
Admission: RE | Admit: 2019-03-24 | Discharge: 2019-03-24 | Disposition: A | Discharge status: Home / Self Care | Payer: No Typology Code available for payment source | Source: Ambulatory Visit | Attending: Family Medicine | Admitting: Family Medicine

## 2019-03-24 ENCOUNTER — Encounter: Payer: Self-pay | Admitting: Family Medicine

## 2019-03-24 ENCOUNTER — Other Ambulatory Visit: Payer: Self-pay | Admitting: Family Medicine

## 2019-03-24 ENCOUNTER — Other Ambulatory Visit: Payer: Self-pay

## 2019-03-24 DIAGNOSIS — IMO0002 Reserved for concepts with insufficient information to code with codable children: Secondary | ICD-10-CM

## 2019-03-24 DIAGNOSIS — Q600 Renal agenesis, unilateral: Secondary | ICD-10-CM

## 2019-03-24 DIAGNOSIS — R109 Unspecified abdominal pain: Secondary | ICD-10-CM

## 2019-03-24 MED FILL — ZOLPIDEM TARTRATE 10 MG TAB: 10 | 30 days supply | Qty: 30 | Fill #1

## 2019-03-24 MED FILL — PREGABALIN 150 MG CAPS: 150 | 30 days supply | Qty: 90 | Fill #1

## 2019-03-24 NOTE — Telephone Encounter (Signed)
CT scan to be ordered, please inform patient

## 2019-03-27 ENCOUNTER — Other Ambulatory Visit: Payer: Self-pay | Admitting: *Deleted

## 2019-03-27 ENCOUNTER — Encounter: Payer: Self-pay | Admitting: Family Medicine

## 2019-03-27 DIAGNOSIS — IMO0002 Reserved for concepts with insufficient information to code with codable children: Secondary | ICD-10-CM

## 2019-03-27 DIAGNOSIS — Q600 Renal agenesis, unilateral: Secondary | ICD-10-CM

## 2019-03-27 DIAGNOSIS — R109 Unspecified abdominal pain: Secondary | ICD-10-CM

## 2019-03-28 ENCOUNTER — Other Ambulatory Visit: Payer: No Typology Code available for payment source

## 2019-03-28 ENCOUNTER — Other Ambulatory Visit: Payer: Self-pay

## 2019-03-28 DIAGNOSIS — Q600 Renal agenesis, unilateral: Secondary | ICD-10-CM

## 2019-03-28 DIAGNOSIS — IMO0002 Reserved for concepts with insufficient information to code with codable children: Secondary | ICD-10-CM

## 2019-03-28 DIAGNOSIS — R109 Unspecified abdominal pain: Secondary | ICD-10-CM

## 2019-03-28 LAB — URINALYSIS, ROUTINE W REFLEX MICROSCOPIC
Bilirubin Urine: NEGATIVE
Glucose, UA: NEGATIVE
Hgb urine dipstick: NEGATIVE
Ketones, ur: NEGATIVE
Leukocytes,Ua: NEGATIVE
Nitrite: NEGATIVE
Protein, ur: NEGATIVE
Specific Gravity, Urine: 1.016 (ref 1.001–1.03)
pH: 5.5 (ref 5.0–8.0)

## 2019-03-29 ENCOUNTER — Encounter: Payer: Self-pay | Admitting: Family Medicine

## 2019-03-29 ENCOUNTER — Ambulatory Visit (INDEPENDENT_AMBULATORY_CARE_PROVIDER_SITE_OTHER): Payer: No Typology Code available for payment source | Admitting: Family Medicine

## 2019-03-29 VITALS — BP 130/78 | HR 72 | Temp 96.8°F | Resp 18 | Ht 72.0 in | Wt 232.0 lb

## 2019-03-29 DIAGNOSIS — S39012A Strain of muscle, fascia and tendon of lower back, initial encounter: Secondary | ICD-10-CM | POA: Diagnosis not present

## 2019-03-29 LAB — URINE CULTURE
MICRO NUMBER:: 1222276
Result:: NO GROWTH
SPECIMEN QUALITY:: ADEQUATE

## 2019-03-29 MED ORDER — TIZANIDINE HCL 4 MG PO CAPS: 4.0000 mg | ORAL_CAPSULE | Freq: Three times a day (TID) | ORAL | 0 refills | Status: DC | PRN

## 2019-03-29 MED ORDER — PREDNISONE 20 MG PO TABS: ORAL_TABLET | ORAL | 0 refills | Status: DC
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
## 2019-04-08 ENCOUNTER — Other Ambulatory Visit: Payer: Self-pay | Admitting: Family Medicine

## 2019-04-10 MED FILL — EZETIMIBE 10 MG TABS: 10 | 90 days supply | Qty: 90 | Fill #0

## 2019-04-15 MED FILL — TOPIRAMATE 100 MG TABLET: 100 | 30 days supply | Qty: 60 | Fill #3

## 2019-04-23 MED FILL — PREGABALIN 150 MG CAPS: 150 | 30 days supply | Qty: 90 | Fill #2

## 2019-04-23 MED FILL — ZOLPIDEM TARTRATE 10 MG TAB: 10 | 30 days supply | Qty: 30 | Fill #2

## 2019-05-02 ENCOUNTER — Encounter: Payer: Self-pay | Admitting: Family Medicine

## 2019-05-02 ENCOUNTER — Ambulatory Visit (INDEPENDENT_AMBULATORY_CARE_PROVIDER_SITE_OTHER): Payer: No Typology Code available for payment source | Admitting: Family Medicine

## 2019-05-02 ENCOUNTER — Ambulatory Visit (HOSPITAL_COMMUNITY)
Admission: RE | Admit: 2019-05-02 | Discharge: 2019-05-02 | Disposition: A | Discharge status: Home / Self Care | Payer: No Typology Code available for payment source | Source: Ambulatory Visit | Attending: Family Medicine | Admitting: Family Medicine

## 2019-05-02 ENCOUNTER — Other Ambulatory Visit: Payer: Self-pay

## 2019-05-02 VITALS — BP 128/74 | HR 80 | Temp 96.9°F | Resp 18 | Ht 72.0 in | Wt 232.0 lb

## 2019-05-02 DIAGNOSIS — M25522 Pain in left elbow: Secondary | ICD-10-CM

## 2019-05-02 DIAGNOSIS — S59902A Unspecified injury of left elbow, initial encounter: Secondary | ICD-10-CM | POA: Diagnosis not present

## 2019-05-02 MED ORDER — LIDOCAINE 5 % EX PTCH: 1.0000 | MEDICATED_PATCH | CUTANEOUS | 0 refills | Status: DC
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
## 2019-05-09 ENCOUNTER — Other Ambulatory Visit: Payer: Self-pay | Admitting: *Deleted

## 2019-05-09 MED ORDER — PANTOPRAZOLE SODIUM 40 MG PO TBEC: 40.0000 mg | DELAYED_RELEASE_TABLET | Freq: Two times a day (BID) | ORAL | 2 refills | Status: DC

## 2019-05-09 MED FILL — PANTOPRAZOLE SOD DR 40 MG T: 40 | 90 days supply | Qty: 180 | Fill #0

## 2019-05-09 MED FILL — CYCLOBENZAPRINE HCL 5 MG TA: 5 | 30 days supply | Qty: 90 | Fill #2

## 2019-05-11 ENCOUNTER — Ambulatory Visit (INDEPENDENT_AMBULATORY_CARE_PROVIDER_SITE_OTHER): Payer: No Typology Code available for payment source | Admitting: Family Medicine

## 2019-05-11 ENCOUNTER — Other Ambulatory Visit: Payer: Self-pay

## 2019-05-11 ENCOUNTER — Encounter: Payer: Self-pay | Admitting: Family Medicine

## 2019-05-11 VITALS — BP 130/70 | HR 78 | Temp 97.0°F | Resp 16 | Ht 72.0 in | Wt 232.0 lb

## 2019-05-11 DIAGNOSIS — M25522 Pain in left elbow: Secondary | ICD-10-CM | POA: Diagnosis not present

## 2019-05-11 DIAGNOSIS — M7702 Medial epicondylitis, left elbow: Secondary | ICD-10-CM | POA: Diagnosis not present
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
## 2019-05-15 MED FILL — TOPIRAMATE 100 MG TABLET: 100 | 30 days supply | Qty: 60 | Fill #4

## 2019-05-22 ENCOUNTER — Other Ambulatory Visit: Payer: Self-pay | Admitting: *Deleted

## 2019-05-22 MED ORDER — LYRICA 150 MG PO CAPS: 150.0000 mg | ORAL_CAPSULE | Freq: Two times a day (BID) | ORAL | 2 refills | Status: DC

## 2019-05-22 MED FILL — PREGABALIN 150 MG CAPS: 150 | 30 days supply | Qty: 90 | Fill #0

## 2019-05-22 NOTE — Telephone Encounter (Signed)
Received fax requesting refill on Lyrica.   Ok to refill??  Last office visit 05/11/2019.  Last refill 03/20/2019, #2 refills.

## 2019-05-23 ENCOUNTER — Other Ambulatory Visit: Payer: Self-pay | Admitting: Family Medicine

## 2019-05-23 MED FILL — ZOLPIDEM TARTRATE 10 MG TAB: 10 | 30 days supply | Qty: 30 | Fill #0

## 2019-05-23 NOTE — Telephone Encounter (Signed)
Ok to refill??  Last office visit 03/20/2019.  Last refill 02/24/2019, #2 refills.

## 2019-05-25 ENCOUNTER — Ambulatory Visit: Payer: BLUE CROSS/BLUE SHIELD

## 2019-05-25 MED ORDER — JARDIANCE 25 MG PO TABS
ORAL_TABLET | 0 refills | Status: AC
Start: 2019-05-25 — End: ?

## 2019-06-06 MED FILL — CYCLOBENZAPRINE HCL 5 MG TA: 5 | 30 days supply | Qty: 90 | Fill #0

## 2019-06-14 ENCOUNTER — Other Ambulatory Visit: Payer: Self-pay | Admitting: Neurology

## 2019-06-14 ENCOUNTER — Other Ambulatory Visit: Payer: Self-pay | Admitting: *Deleted

## 2019-06-14 MED ORDER — TOPIRAMATE 100 MG PO TABS: 100.0000 mg | ORAL_TABLET | Freq: Two times a day (BID) | ORAL | 5 refills | Status: DC

## 2019-06-14 MED FILL — TOPIRAMATE 100 MG TABLET: 100 | 30 days supply | Qty: 60 | Fill #0

## 2019-06-19 MED FILL — PREGABALIN 150 MG CAPS: 150 | 30 days supply | Qty: 60 | Fill #0

## 2019-06-20 MED FILL — ZOLPIDEM TARTRATE 10 MG TAB: 10 | 30 days supply | Qty: 30 | Fill #1

## 2019-06-21 MED FILL — DICLOFENAC SODIUM 1 % GEL: 1 | 18 days supply | Qty: 300 | Fill #0

## 2019-06-22 MED ORDER — JARDIANCE 25 MG PO TABS
ORAL_TABLET | 0 refills
Start: 2019-06-22 — End: ?

## 2019-06-23 ENCOUNTER — Ambulatory Visit: Payer: BLUE CROSS/BLUE SHIELD

## 2019-06-23 DIAGNOSIS — Z23 Encounter for immunization: Secondary | ICD-10-CM

## 2019-06-29 ENCOUNTER — Ambulatory Visit: Payer: No Typology Code available for payment source | Attending: Internal Medicine

## 2019-06-29 DIAGNOSIS — Z23 Encounter for immunization: Secondary | ICD-10-CM

## 2019-06-29 NOTE — Progress Notes (Signed)
   Covid-19 Vaccination [facility]  Name:  Derek [name]    MRN: [id number] DOB: [birth year]  06/29/2019  Mr. Ginger was observed post Covid-19 immunization for 15 minutes without incident. He was provided with Vaccine Information Sheet and instruction to access the V-Safe system.   Mr. [name] was instructed to call 911 with any severe reactions post vaccine: [name] Kitchen Difficulty breathing  . Swelling of face and throat  . A fast heartbeat  . A bad rash all over body  . Dizziness and weakness   Immunizations Administered    Name Date Dose VIS Date Route   Moderna COVID-19 Vaccine 06/29/2019  8:05 AM 0.5 mL 03/07/2019 Intramuscular   Manufacturer: Moderna   Lot: [id number]   [id number]

## 2019-07-03 MED FILL — CYCLOBENZAPRINE HCL 5 MG TA: 5 | 30 days supply | Qty: 90 | Fill #1

## 2019-07-05 MED FILL — DICLOFENAC SODIUM 1 % GEL: 1 | 18 days supply | Qty: 300 | Fill #1

## 2019-07-07 MED FILL — EZETIMIBE 10 MG TABS: 10 | 90 days supply | Qty: 90 | Fill #0

## 2019-07-08 MED FILL — TOPIRAMATE 100 MG TABLET: 100 | 30 days supply | Qty: 60 | Fill #1

## 2019-07-12 ENCOUNTER — Ambulatory Visit: Payer: No Typology Code available for payment source | Admitting: Neurology

## 2019-07-17 ENCOUNTER — Telehealth: Payer: Self-pay | Admitting: *Deleted

## 2019-07-17 ENCOUNTER — Other Ambulatory Visit: Payer: Self-pay

## 2019-07-17 ENCOUNTER — Encounter: Payer: Self-pay | Admitting: Family Medicine

## 2019-07-17 ENCOUNTER — Ambulatory Visit (INDEPENDENT_AMBULATORY_CARE_PROVIDER_SITE_OTHER): Payer: No Typology Code available for payment source | Admitting: Family Medicine

## 2019-07-17 VITALS — BP 128/64 | HR 72 | Temp 98.3°F | Resp 12 | Ht 72.0 in | Wt 230.0 lb

## 2019-07-17 DIAGNOSIS — Z0001 Encounter for general adult medical examination with abnormal findings: Secondary | ICD-10-CM

## 2019-07-17 DIAGNOSIS — G43709 Chronic migraine without aura, not intractable, without status migrainosus: Secondary | ICD-10-CM

## 2019-07-17 DIAGNOSIS — G4733 Obstructive sleep apnea (adult) (pediatric): Secondary | ICD-10-CM

## 2019-07-17 DIAGNOSIS — M545 Low back pain, unspecified: Secondary | ICD-10-CM

## 2019-07-17 DIAGNOSIS — M51369 Other intervertebral disc degeneration, lumbar region without mention of lumbar back pain or lower extremity pain: Secondary | ICD-10-CM

## 2019-07-17 DIAGNOSIS — M5136 Other intervertebral disc degeneration, lumbar region: Secondary | ICD-10-CM

## 2019-07-17 DIAGNOSIS — E781 Pure hyperglyceridemia: Secondary | ICD-10-CM

## 2019-07-17 DIAGNOSIS — Z0000 Encounter for general adult medical examination without abnormal findings: Secondary | ICD-10-CM

## 2019-07-17 DIAGNOSIS — G8929 Other chronic pain: Secondary | ICD-10-CM | POA: Insufficient documentation

## 2019-07-17 DIAGNOSIS — M549 Dorsalgia, unspecified: Secondary | ICD-10-CM | POA: Insufficient documentation

## 2019-07-17 DIAGNOSIS — G43909 Migraine, unspecified, not intractable, without status migrainosus: Secondary | ICD-10-CM | POA: Insufficient documentation

## 2019-07-17 DIAGNOSIS — E041 Nontoxic single thyroid nodule: Secondary | ICD-10-CM

## 2019-07-17 MED FILL — PREGABALIN 150 MG CAPS: 150 | 30 days supply | Qty: 60 | Fill #1

## 2019-07-17 NOTE — Telephone Encounter (Signed)
Patient returned call and made aware.

## 2019-07-17 NOTE — Assessment & Plan Note (Signed)
Recheck labs off zetia

## 2019-07-17 NOTE — Patient Instructions (Addendum)
Try a copper fit elbow sleeve We will call with lab results  New referral to [facility]  F/U 6 months

## 2019-07-17 NOTE — Assessment & Plan Note (Signed)
Due for repeat US on thyroid

## 2019-07-17 NOTE — Telephone Encounter (Signed)
-----   Message from Alycia Rossetti, MD sent at 07/17/2019 10:15 AM EDT ----- Regarding: Thyriod [place]    Please let pt know I forgot to tell him, he was due for repeat thyroid US due to the cyst he had a few years ago.     I have ordered this for [facility]

## 2019-07-17 NOTE — Assessment & Plan Note (Signed)
Referral to [place] where he gets migraine treatment for his OSA Migraines controlled

## 2019-07-17 NOTE — Telephone Encounter (Signed)
Call placed to patient. LMTRC.  

## 2019-07-17 NOTE — Progress Notes (Signed)
   Subjective:    Patient ID: Derek [name], male    DOB: 01-16-1961, 59 y.o.   MRN: [id number]  Patient presents for Annual Exam (is fasting)  Patient here for complete physical exam.  Medications and history reviewed Colonoscopy up-to-date Immunizations flu/tetanus/ shingrix  up-to-date COVID 19 vaccine #1 complete   Hep C screening Negative - UTD  Hyperlipidemia- controlling with diet, he is not been on zetia for months   Chronic insomnia- taking ambien   Chronic pain- multiple surgeries, taking lyrica BID ,, Flexeril 15mg  at bedtime   Migraine disorder- taking topamax, followed by neurology , also has fiorocet    PSA normal in June 2020   He gets flares of his elbow tendinitis, the steroid shot definely  Given topical anti-inflammatory from ortho last week  Advised to try copper sleeve    OSA needs new machine but told he needs to go back to his pre scriber, which was Dr. Merlene Laughter in the past. He has had difficulty getting in contact with him   Review Of Systems:  GEN- denies fatigue, fever, weight loss,weakness, recent illness HEENT- denies eye drainage, change in vision, nasal discharge, CVS- denies chest pain, palpitations RESP- denies SOB, cough, wheeze ABD- denies N/V, change in stools, abd pain GU- denies dysuria, hematuria, dribbling, incontinence MSK- + joint pain, muscle aches, injury Neuro- denies headache, dizziness, syncope, seizure activity       Objective:    BP 128/64   Pulse 72   Temp 98.3 F (36.8 C) (Temporal)   Resp 12   Ht 6' (1.829 m)   Wt 230 lb (104.3 kg)   SpO2 98%   BMI 31.19 kg/m  GEN- NAD, alert and oriented x3 HEENT- PERRL, EOMI, non injected sclera, pink conjunctiva, MMM, oropharynx clear Neck- Supple, no thyromegaly, no bruit  CVS- RRR, no murmur RESP-CTAB ABD-NABS,soft,NT,ND EXT- No edema Pulses- Radial, DP- 2+   Fall/CAGE/Depression screen negative      Assessment & Plan:      Problem List Items Addressed  This Visit      Unprioritized   Chronic back pain    Continue lyrica, flexeril 15mg  at bedtime he has done this combination for years with good control He is able to stay active       Degenerative disc disease, lumbar   Hypertriglyceridemia    Recheck labs off zetia      Relevant Orders   Lipid panel   Migraines    Referral to [place] where he gets migraine treatment for his OSA Migraines controlled        Sleep apnea    Followed by neurology needs new refill       Relevant Orders   Ambulatory referral to Neurology   Thyroid cyst    Due for repeat US on thyroid       Relevant Orders   US THYROID   TSH    Other Visit Diagnoses    Routine general medical examination at a health care facility    -  Primary   CPE done, pt getting COVID -19 vaccine  fasting labs, PSA normal   Relevant Orders   CBC with Differential/Platelet   Comprehensive metabolic panel      Note: This dictation was prepared with Dragon dictation along with smaller phrase technology. Any transcriptional errors that result from this process are unintentional.

## 2019-07-17 NOTE — Assessment & Plan Note (Signed)
Continue lyrica, flexeril 15mg  at bedtime he has done this combination for years with good control He is able to stay active

## 2019-07-17 NOTE — Assessment & Plan Note (Signed)
Followed by neurology needs new refill

## 2019-07-18 ENCOUNTER — Other Ambulatory Visit: Payer: Self-pay | Admitting: *Deleted

## 2019-07-18 LAB — CBC WITH DIFFERENTIAL/PLATELET
Absolute Monocytes: 482 cells/uL (ref 200–950)
Basophils Absolute: 42 cells/uL (ref 0–200)
Basophils Relative: 0.8 %
Eosinophils Absolute: 122 cells/uL (ref 15–500)
Eosinophils Relative: 2.3 %
HCT: 48.9 % (ref 38.5–50.0)
Hemoglobin: 16.2 g/dL (ref 13.2–17.1)
Lymphs Abs: 1929 cells/uL (ref 850–3900)
MCH: 30.5 pg (ref 27.0–33.0)
MCHC: 33.1 g/dL (ref 32.0–36.0)
MCV: 91.9 fL (ref 80.0–100.0)
MPV: 9.6 fL (ref 7.5–12.5)
Monocytes Relative: 9.1 %
Neutro Abs: 2724 cells/uL (ref 1500–7800)
Neutrophils Relative %: 51.4 %
Platelets: 244 10*3/uL (ref 140–400)
RBC: 5.32 10*6/uL (ref 4.20–5.80)
RDW: 12.5 % (ref 11.0–15.0)
Total Lymphocyte: 36.4 %
WBC: 5.3 10*3/uL (ref 3.8–10.8)

## 2019-07-18 LAB — LIPID PANEL
Cholesterol: 232 mg/dL — ABNORMAL HIGH (ref <200)
HDL: 44 mg/dL (ref >=40)
LDL Cholesterol (Calc): 159 mg/dL (calc) — ABNORMAL HIGH
Non-HDL Cholesterol (Calc): 188 mg/dL (calc) — ABNORMAL HIGH (ref <130)
Total CHOL/HDL Ratio: 5.3 (calc) — ABNORMAL HIGH (ref <5.0)
Triglycerides: 154 mg/dL — ABNORMAL HIGH (ref <150)

## 2019-07-18 LAB — COMPREHENSIVE METABOLIC PANEL
AG Ratio: 2 (calc) (ref 1.0–2.5)
ALT: 27 U/L (ref 9–46)
AST: 25 U/L (ref 10–35)
Albumin: 4.7 g/dL (ref 3.6–5.1)
Alkaline phosphatase (APISO): 65 U/L (ref 35–144)
BUN: 19 mg/dL (ref 7–25)
CO2: 27 mmol/L (ref 20–32)
Calcium: 10 mg/dL (ref 8.6–10.3)
Chloride: 105 mmol/L (ref 98–110)
Creat: 1.14 mg/dL (ref 0.70–1.25)
Globulin: 2.4 g/dL (calc) (ref 1.9–3.7)
Glucose, Bld: 94 mg/dL (ref 65–99)
Potassium: 4.6 mmol/L (ref 3.5–5.3)
Sodium: 140 mmol/L (ref 135–146)
Total Bilirubin: 0.7 mg/dL (ref 0.2–1.2)
Total Protein: 7.1 g/dL (ref 6.1–8.1)

## 2019-07-18 LAB — TSH: TSH: 2.1 mIU/L (ref 0.40–4.50)

## 2019-07-18 MED ORDER — EZETIMIBE 10 MG PO TABS: 10.0000 mg | ORAL_TABLET | Freq: Every day | ORAL | 3 refills | Status: DC

## 2019-07-20 MED FILL — ZOLPIDEM TARTRATE 10 MG TAB: 10 | 30 days supply | Qty: 30 | Fill #2

## 2019-07-21 MED FILL — DICLOFENAC SODIUM 1 % GEL: 1 | 18 days supply | Qty: 300 | Fill #2

## 2019-07-26 ENCOUNTER — Ambulatory Visit (HOSPITAL_COMMUNITY)
Admission: RE | Admit: 2019-07-26 | Discharge: 2019-07-26 | Disposition: A | Discharge status: Home / Self Care | Payer: No Typology Code available for payment source | Source: Ambulatory Visit | Attending: Family Medicine | Admitting: Family Medicine

## 2019-07-26 ENCOUNTER — Other Ambulatory Visit: Payer: Self-pay

## 2019-07-26 DIAGNOSIS — E041 Nontoxic single thyroid nodule: Secondary | ICD-10-CM | POA: Diagnosis not present

## 2019-07-26 MED ORDER — ATORVASTATIN CALCIUM 80 MG PO TABS
ORAL_TABLET | 3 refills | Status: AC
Start: 2019-07-26 — End: ?

## 2019-07-26 MED ORDER — HYDROCHLOROTHIAZIDE 25 MG PO TABS
ORAL_TABLET | 3 refills | Status: AC
Start: 2019-07-26 — End: ?

## 2019-07-26 MED ORDER — LOSARTAN POTASSIUM 50 MG PO TABS
ORAL_TABLET | 3 refills | Status: AC
Start: 2019-07-26 — End: ?

## 2019-07-26 MED ORDER — METFORMIN HCL ER 500 MG PO TB24
ORAL_TABLET | 3 refills | Status: AC
Start: 2019-07-26 — End: ?

## 2019-07-27 ENCOUNTER — Encounter: Payer: Self-pay | Admitting: Neurology

## 2019-07-27 ENCOUNTER — Ambulatory Visit (INDEPENDENT_AMBULATORY_CARE_PROVIDER_SITE_OTHER): Payer: No Typology Code available for payment source | Admitting: Neurology

## 2019-07-27 VITALS — BP 133/90 | HR 66 | Temp 97.2°F | Ht 72.0 in | Wt 233.3 lb

## 2019-07-27 DIAGNOSIS — E669 Obesity, unspecified: Secondary | ICD-10-CM

## 2019-07-27 DIAGNOSIS — Z9989 Dependence on other enabling machines and devices: Secondary | ICD-10-CM

## 2019-07-27 DIAGNOSIS — G4719 Other hypersomnia: Secondary | ICD-10-CM | POA: Diagnosis not present

## 2019-07-27 DIAGNOSIS — G479 Sleep disorder, unspecified: Secondary | ICD-10-CM

## 2019-07-27 DIAGNOSIS — Z789 Other specified health status: Secondary | ICD-10-CM

## 2019-07-27 DIAGNOSIS — G4700 Insomnia, unspecified: Secondary | ICD-10-CM

## 2019-07-27 DIAGNOSIS — G4733 Obstructive sleep apnea (adult) (pediatric): Secondary | ICD-10-CM | POA: Diagnosis not present

## 2019-07-27 NOTE — Patient Instructions (Signed)
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
## 2019-07-31 MED FILL — CYCLOBENZAPRINE HCL 5 MG TA: 5 | 30 days supply | Qty: 90 | Fill #0

## 2019-08-01 ENCOUNTER — Ambulatory Visit: Payer: No Typology Code available for payment source | Attending: Internal Medicine

## 2019-08-01 DIAGNOSIS — Z23 Encounter for immunization: Secondary | ICD-10-CM

## 2019-08-01 MED FILL — PANTOPRAZOLE SOD DR 40 MG T: 40 | 90 days supply | Qty: 180 | Fill #1

## 2019-08-01 NOTE — Progress Notes (Signed)
   Covid-19 Vaccination Clinic  Name:  Derek [name]    MRN: [id number] DOB: 1960-09-26  08/01/2019  [name] was observed post Covid-19 immunization for 15 minutes without incident. He was provided with Vaccine Information Sheet and instruction to access the V-Safe system.   [name] was instructed to call 911 with any severe reactions post vaccine: [name] Kitchen Difficulty breathing  . Swelling of face and throat  . A fast heartbeat  . A bad rash all over body  . Dizziness and weakness   Immunizations Administered    Name Date Dose VIS Date Route   Moderna COVID-19 Vaccine 08/01/2019  8:40 AM 0.5 mL 03/2019 Intramuscular   Manufacturer: Moderna   Lot: [id number]   [id number]

## 2019-08-03 ENCOUNTER — Telehealth: Payer: Self-pay

## 2019-08-03 NOTE — Telephone Encounter (Signed)
LVM for pt to call me back to schedule sleep study  

## 2019-08-07 MED FILL — TOPIRAMATE 100 MG TABLET: 100 | 30 days supply | Qty: 60 | Fill #2

## 2019-08-19 ENCOUNTER — Other Ambulatory Visit: Payer: Self-pay | Admitting: Family Medicine

## 2019-08-21 MED FILL — ZOLPIDEM TARTRATE 10 MG TAB: 10 | 30 days supply | Qty: 30 | Fill #0

## 2019-08-21 NOTE — Telephone Encounter (Signed)
Ok to refill??  Last office visit 07/17/2019.  Last refill 05/23/2019, #2 refills.

## 2019-08-24 ENCOUNTER — Ambulatory Visit: Payer: No Typology Code available for payment source | Admitting: Neurology

## 2019-08-28 MED FILL — CYCLOBENZAPRINE HCL 5 MG TA: 5 | 30 days supply | Qty: 90 | Fill #1

## 2019-09-01 ENCOUNTER — Other Ambulatory Visit: Payer: Self-pay

## 2019-09-01 ENCOUNTER — Ambulatory Visit (INDEPENDENT_AMBULATORY_CARE_PROVIDER_SITE_OTHER): Payer: No Typology Code available for payment source | Admitting: Neurology

## 2019-09-01 DIAGNOSIS — Z789 Other specified health status: Secondary | ICD-10-CM

## 2019-09-01 DIAGNOSIS — G4720 Circadian rhythm sleep disorder, unspecified type: Secondary | ICD-10-CM

## 2019-09-01 DIAGNOSIS — G4719 Other hypersomnia: Secondary | ICD-10-CM

## 2019-09-01 DIAGNOSIS — E669 Obesity, unspecified: Secondary | ICD-10-CM

## 2019-09-01 DIAGNOSIS — G4733 Obstructive sleep apnea (adult) (pediatric): Secondary | ICD-10-CM

## 2019-09-01 DIAGNOSIS — G4700 Insomnia, unspecified: Secondary | ICD-10-CM

## 2019-09-07 MED FILL — TOPIRAMATE 100 MG TABLET: 100 | 30 days supply | Qty: 60 | Fill #3

## 2019-09-13 ENCOUNTER — Ambulatory Visit (INDEPENDENT_AMBULATORY_CARE_PROVIDER_SITE_OTHER): Payer: No Typology Code available for payment source | Admitting: Neurology

## 2019-09-13 ENCOUNTER — Encounter: Payer: Self-pay | Admitting: Neurology

## 2019-09-13 ENCOUNTER — Other Ambulatory Visit: Payer: Self-pay | Admitting: Neurology

## 2019-09-13 VITALS — BP 128/79 | HR 81 | Ht 72.0 in | Wt 235.0 lb

## 2019-09-13 DIAGNOSIS — G43709 Chronic migraine without aura, not intractable, without status migrainosus: Secondary | ICD-10-CM | POA: Diagnosis not present

## 2019-09-13 MED ORDER — BUTALBITAL-APAP-CAFFEINE 50-325-40 MG PO TABS: 1.0000 | ORAL_TABLET | Freq: Four times a day (QID) | ORAL | 3 refills | Status: DC | PRN

## 2019-09-13 MED ORDER — TOPIRAMATE 100 MG PO TABS: 100.0000 mg | ORAL_TABLET | Freq: Every day | ORAL | 3 refills | Status: DC
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
## 2019-09-13 NOTE — Patient Instructions (Signed)
Continue current medications See you back in 8 months

## 2019-09-13 NOTE — Telephone Encounter (Signed)
Pt called stating he thought he had enough of his butalbital-acetaminophen-caffeine (FIORICET) 50-325-40 MG tablet but was mistaken and is needing a refill on it. [facility]

## 2019-09-18 ENCOUNTER — Telehealth: Payer: Self-pay | Admitting: Family Medicine

## 2019-09-18 MED ORDER — LORATADINE 10 MG PO TABS: 10.0000 mg | ORAL_TABLET | Freq: Every day | ORAL | 3 refills | Status: DC

## 2019-09-18 MED ORDER — EZETIMIBE 10 MG PO TABS: 10.0000 mg | ORAL_TABLET | Freq: Every day | ORAL | 3 refills | Status: DC

## 2019-09-18 MED ORDER — PANTOPRAZOLE SODIUM 40 MG PO TBEC: 40.0000 mg | DELAYED_RELEASE_TABLET | Freq: Two times a day (BID) | ORAL | 2 refills | Status: DC

## 2019-09-18 MED ORDER — LINACLOTIDE 145 MCG PO CAPS: ORAL_CAPSULE | ORAL | 3 refills | Status: DC

## 2019-09-18 NOTE — Telephone Encounter (Signed)
Patient requesting refill on all of his medication he would like a 3 month supply called into [facility]. His wife has new insurance with [employer] [place] he states the [id number] RX PCN-IRX Grp # [id number] Life [id number], issuer [phone] ID # [id number]  CB# [phone]

## 2019-09-18 NOTE — Telephone Encounter (Signed)
Call placed to patient.   Advised that controlled substances will not be filled through mail order.   Routine prescriptions sent to pharmacy.

## 2019-09-19 ENCOUNTER — Other Ambulatory Visit: Payer: Self-pay | Admitting: *Deleted

## 2019-09-19 MED ORDER — TOPIRAMATE 100 MG PO TABS: 100.0000 mg | ORAL_TABLET | Freq: Every day | ORAL | 3 refills | Status: DC

## 2019-09-21 NOTE — Addendum Note (Signed)
Addended by: Star Age on: 09/21/2019 02:05 PM   Modules accepted: Orders

## 2019-09-21 NOTE — Progress Notes (Signed)
Patient referred by Dr. Buelah Manis for re-eval of his OSA. He has an older CPAP. He was seen by me on 07/27/19, diagnostic PSG on 09/01/19.    Please call and notify the patient that the recent sleep study did confirm the diagnosis of obstructive sleep apnea. OSA is overall mild, but severe during REM sleep and he did not achieve any sleep on his back. I recommend treatment in the form of autoPAP, which means, that we don't have to bring him back for a second sleep study with CPAP, but will let him try an autoPAP machine at home, through a DME company (of his choice, or as per insurance requirement). The DME representative will educate him on how to use the machine, how to put the mask on, etc. I have placed an order in the chart. Please send referral, talk to patient, send report to referring MD. We will need a FU in sleep clinic for 10 weeks post-PAP set up, please arrange that with me or one of our NPs. Thanks,   Star Age, MD, PhD [facility])

## 2019-09-21 NOTE — Procedures (Signed)
PATIENT'S NAME:  Derek [name], Derek [name] DOB:      [birth year]      MR#:    [id number]     DATE OF RECORDING: 09/01/2019 REFERRING M.D.:  Vic Blackbird, MD Study Performed:   Baseline Polysomnogram HISTORY: 59 year old man with a history of congenital single kidney, hyperlipidemia, reflux disease, diverticulosis, BPH, arthritis, recurrent headaches, pancreatic disease, and mild obesity, who was previously diagnosed with obstructive sleep apnea and placed on CPAP therapy. He was diagnosed in 2014 and has been on CPAP of 6 cm with variable compliance. He would like to get new equipment. The patient endorsed the Epworth Sleepiness Scale at 6 points. The patient's weight 234 pounds with a height of 72 (inches), resulting in a BMI of 31.7 kg/m2. The patient's neck circumference measured 20.25 inches.  CURRENT MEDICATIONS: ASA 81mg , Fioricet, Flexeril, Zetia, Linzess, Claritin, Lyrica, Multivitamin, Protonix, Topamax, Vit C, Ambien   PROCEDURE:  This is a multichannel digital polysomnogram utilizing the Somnostar 11.2 system.  Electrodes and sensors were applied and monitored per AASM Specifications.   EEG, EOG, Chin and Limb EMG, were sampled at 200 Hz.  ECG, Snore and Nasal Pressure, Thermal Airflow, Respiratory Effort, CPAP Flow and Pressure, Oximetry was sampled at 50 Hz. Digital video and audio were recorded.      BASELINE STUDY  Lights Out was at 20:43 and Lights On at 04:32.  Total recording time (TRT) was 469.5 minutes, with a total sleep time (TST) of 436 minutes.   The patient's sleep latency was 8.5 minutes.  REM latency was 109 minutes, which is high normal. The sleep efficiency was 92.9 %.     SLEEP ARCHITECTURE: WASO (Wake after sleep onset) was 25 minutes with mild to moderate sleep fragmentation noted. There were 60.5 minutes in Stage N1, 257 minutes Stage N2, 51 minutes Stage N3 and 67.5 minutes in Stage REM.  The percentage of Stage N1 was 13.9%, which is increased, Stage N2 was 58.9%, which is  mildly increased, Stage N3 was 11.7% and Stage R (REM sleep) was 15.5%, which is reduced. The arousals were noted as: 63 were spontaneous, 0 were associated with PLMs, 13 were associated with respiratory events.  RESPIRATORY ANALYSIS:  There were a total of 62 respiratory events:  0 obstructive apneas, 0 central apneas and 0 mixed apneas with a total of 0 apneas and an apnea index (AI) of 0 /hour. There were 62 hypopneas with a hypopnea index of 8.5 /hour. The patient also had 0 respiratory event related arousals (RERAs).      The total APNEA/HYPOPNEA INDEX (AHI) was 8.5/hour and the total RESPIRATORY DISTURBANCE INDEX was  8.5 /hour.  58 events occurred in REM sleep and 8 events in NREM. The REM AHI was  51.6 /hour, versus a non-REM AHI of .7. The patient spent 0 minutes of total sleep time in the supine position and 436 minutes in non-supine.. The supine AHI was n/a versus a non-supine AHI of 8.5.  OXYGEN SATURATION & C02:  The Wake baseline 02 saturation was 92%, with the lowest being 75%. Time spent below 89% saturation equaled 20 minutes.  PERIODIC LIMB MOVEMENTS: The patient had a total of 0 Periodic Limb Movements.  The Periodic Limb Movement (PLM) index was 0 and the PLM Arousal index was 0/hour.  Audio and video analysis did not show any abnormal or unusual movements, behaviors, phonations or vocalizations. The patient took no bathroom breaks. Mild snoring was noted. The EKG was in keeping with normal sinus rhythm (NSR).  Post-study, the patient indicated that sleep was the same as usual.   IMPRESSION: 1. Obstructive Sleep Apnea (OSA) 2. Dysfunctions associated with sleep stages or arousal from sleep  RECOMMENDATIONS: 1. This study demonstrates overall mild obstructive sleep apnea, severe in REM sleep with a total AHI of 8.5/hour, REM AHI of 51.6/hour, and O2 nadir of 75%. The absence of supine sleep likely underestimates his AHI and O2 nadir. Given the patient's medical history and sleep  related complaints, treatment with positive airway pressure is recommended; this can be achieved in the form of autoPAP. Alternatively, a full-night CPAP titration study would allow optimization of therapy if needed. Other treatment options may include avoidance of supine sleep position along with weight loss, upper airway or jaw surgery in selected patients or the use of an oral appliance in certain patients. ENT evaluation and/or consultation with a maxillofacial surgeon or dentist may be feasible in some instances.    2. Please note that untreated obstructive sleep apnea may carry additional perioperative morbidity. Patients with significant obstructive sleep apnea should receive perioperative PAP therapy and the surgeons and particularly the anesthesiologist should be informed of the diagnosis and the severity of the sleep disordered breathing. 3. This study shows sleep fragmentation and abnormal sleep stage percentages; these are nonspecific findings and per se do not signify an intrinsic sleep disorder or a cause for the patient's sleep-related symptoms. Causes include (but are not limited to) the first night effect of the sleep study, circadian rhythm disturbances, medication effect or an underlying mood disorder or medical problem.  4. The patient should be cautioned not to drive, work at heights, or operate dangerous or heavy equipment when tired or sleepy. Review and reiteration of good sleep hygiene measures should be pursued with any patient. 5. The patient will be seen in follow-up by Dr. Rexene Alberts at [facility] for discussion of the test results and further management strategies. The referring provider will be notified of the test results.  I certify that I have reviewed the entire raw data recording prior to the issuance of this report in accordance with the Standards of Accreditation of the American Academy of Sleep Medicine (AASM)   Star Age, MD, PhD Diplomat, American Board of Neurology and Sleep  Medicine (Neurology and Sleep Medicine)

## 2019-09-25 ENCOUNTER — Encounter: Payer: Self-pay | Admitting: Family Medicine

## 2019-09-25 MED ORDER — ZOLPIDEM TARTRATE 10 MG PO TABS: ORAL_TABLET | ORAL | 2 refills | Status: DC

## 2019-09-25 NOTE — Telephone Encounter (Signed)
Ok to refill??  Last office visit 07/17/2019.  Last refill 08/21/2019, #2 refills.

## 2019-09-27 ENCOUNTER — Telehealth: Payer: Self-pay

## 2019-09-27 NOTE — Telephone Encounter (Signed)
I called pt. I advised pt that Dr. Rexene Alberts reviewed their sleep study results and found that pt mild to severe osa. Dr. Rexene Alberts recommends that pt start auto pap treatment at home for this. I reviewed PAP compliance expectations with the pt. Pt is agreeable to starting an auto-PAP. I advised pt that an order will be sent to a DME, [facility], and [facility] will call the pt within about one week after they file with the pt's insurance. [facility] will show the pt how to use the machine, fit for masks, and troubleshoot the auto-PAP if needed. A follow up appt was made for insurance purposes with Dr. Rexene Alberts on 12/05/2019 at 1pm. Pt verbalized understanding to arrive 15 minutes early and bring their auto-PAP. A letter with all of this information in it will be mailed to the pt as a reminder. I verified with the pt that the address we have on file is correct. Pt verbalized understanding of results. Pt had no questions at this time but was encouraged to call back if questions arise. I have sent the order to [facility] and have received confirmation that they have received the order.

## 2019-09-27 NOTE — Telephone Encounter (Signed)
-----   Message from Star Age, MD sent at 09/21/2019  2:05 PM EDT ----- Patient referred by Dr. Buelah Manis for re-eval of his OSA. He has an older CPAP. He was seen by me on 07/27/19, diagnostic PSG on 09/01/19.    Please call and notify the patient that the recent sleep study did confirm the diagnosis of obstructive sleep apnea. OSA is overall mild, but severe during REM sleep and he did not achieve any sleep on his back. I recommend treatment in the form of autoPAP, which means, that we don't have to bring him back for a second sleep study with CPAP, but will let him try an autoPAP machine at home, through a DME company (of his choice, or as per insurance requirement). The DME representative will educate him on how to use the machine, how to put the mask on, etc. I have placed an order in the chart. Please send referral, talk to patient, send report to referring MD. We will need a FU in sleep clinic for 10 weeks post-PAP set up, please arrange that with me or one of our NPs. Thanks,   Star Age, MD, PhD [facility])

## 2019-10-31 ENCOUNTER — Encounter: Payer: Self-pay | Admitting: Family Medicine

## 2019-10-31 MED ORDER — CYCLOBENZAPRINE HCL 5 MG PO TABS: 5.0000 mg | ORAL_TABLET | Freq: Three times a day (TID) | ORAL | 3 refills | Status: DC | PRN

## 2019-10-31 NOTE — Telephone Encounter (Signed)
Ok to refill 

## 2019-12-03 ENCOUNTER — Encounter: Payer: Self-pay | Admitting: Neurology

## 2019-12-05 ENCOUNTER — Encounter: Payer: Self-pay | Admitting: Neurology

## 2019-12-05 ENCOUNTER — Ambulatory Visit (INDEPENDENT_AMBULATORY_CARE_PROVIDER_SITE_OTHER): Payer: No Typology Code available for payment source | Admitting: Neurology

## 2019-12-05 VITALS — BP 128/84 | HR 72 | Ht 72.0 in | Wt 236.3 lb

## 2019-12-05 DIAGNOSIS — G4733 Obstructive sleep apnea (adult) (pediatric): Secondary | ICD-10-CM | POA: Diagnosis not present

## 2019-12-05 DIAGNOSIS — Z9989 Dependence on other enabling machines and devices: Secondary | ICD-10-CM | POA: Diagnosis not present
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
## 2019-12-05 NOTE — Patient Instructions (Addendum)
Please continue using your autoPAP regularly. While your insurance requires that you use PAP at least 4 hours each night on 70% of the nights, I recommend, that you not skip any nights and use it throughout the night if you can. Getting used to PAP and staying with the treatment long term does take time and patience and discipline. Untreated obstructive sleep apnea when it is moderate to severe can have an adverse impact on cardiovascular health and raise her risk for heart disease, arrhythmias, hypertension, congestive heart failure, stroke and diabetes. Untreated obstructive sleep apnea causes sleep disruption, nonrestorative sleep, and sleep deprivation. This can have an impact on your day to day functioning and cause daytime sleepiness and impairment of cognitive function, memory loss, mood disturbance, and problems focussing. Using PAP regularly can improve these symptoms. Please continue to use your AutoPap and try to be consistent with it.  I think, long-term, you will reap more benefit from using it. Please keep your appointment with Butler Denmark, nurse practitioner which is scheduled for February 2022.  I would like to review your compliance data in about a month, please call or email [place] through MyChart to remind me to look at your download from the AutoPap machine to see how you doing with apnea control and usage.

## 2019-12-13 ENCOUNTER — Other Ambulatory Visit: Payer: Self-pay | Admitting: *Deleted

## 2019-12-13 MED ORDER — LYRICA 150 MG PO CAPS: 150.0000 mg | ORAL_CAPSULE | Freq: Two times a day (BID) | ORAL | 2 refills | Status: DC

## 2019-12-13 NOTE — Telephone Encounter (Signed)
Received call from patient.   Requested refill on Lyrica.   Ok to refill??  Last office visit 10/31/2019.  Last refill 05/22/2019, #2 refills.

## 2019-12-14 ENCOUNTER — Encounter: Payer: Self-pay | Admitting: Family Medicine

## 2019-12-14 MED ORDER — PREGABALIN 150 MG PO CAPS: 150.0000 mg | ORAL_CAPSULE | Freq: Two times a day (BID) | ORAL | 2 refills | Status: DC

## 2019-12-20 DIAGNOSIS — M4722 Other spondylosis with radiculopathy, cervical region: Secondary | ICD-10-CM | POA: Diagnosis not present

## 2019-12-20 DIAGNOSIS — M542 Cervicalgia: Secondary | ICD-10-CM | POA: Diagnosis not present

## 2019-12-20 DIAGNOSIS — M4324 Fusion of spine, thoracic region: Secondary | ICD-10-CM | POA: Diagnosis not present

## 2019-12-20 DIAGNOSIS — M4326 Fusion of spine, lumbar region: Secondary | ICD-10-CM | POA: Diagnosis not present

## 2019-12-20 DIAGNOSIS — M4322 Fusion of spine, cervical region: Secondary | ICD-10-CM | POA: Diagnosis not present

## 2019-12-25 ENCOUNTER — Other Ambulatory Visit: Payer: Self-pay | Admitting: Family Medicine

## 2019-12-25 NOTE — Telephone Encounter (Signed)
Requested Prescriptions   Pending Prescriptions Disp Refills  . zolpidem (AMBIEN) 10 MG tablet [Pharmacy Med Name: ZOLPIDEM 10MG  TABLETS] 30 tablet 0    Sig: TAKE 1 TABLET BY MOUTH EVERY NIGHT AT BEDTIME     Last OV 07/26/2019   Last written 09/25/2019

## 2020-01-03 ENCOUNTER — Encounter: Payer: Self-pay | Admitting: Neurology

## 2020-01-03 ENCOUNTER — Encounter: Payer: Self-pay | Admitting: Family Medicine

## 2020-01-03 MED ORDER — EZETIMIBE 10 MG PO TABS: 10.0000 mg | ORAL_TABLET | Freq: Every day | ORAL | 3 refills | Status: DC

## 2020-01-04 DIAGNOSIS — Z981 Arthrodesis status: Secondary | ICD-10-CM | POA: Diagnosis not present

## 2020-01-04 DIAGNOSIS — M5412 Radiculopathy, cervical region: Secondary | ICD-10-CM | POA: Diagnosis not present

## 2020-01-09 ENCOUNTER — Other Ambulatory Visit: Payer: Medicare Other

## 2020-01-09 ENCOUNTER — Other Ambulatory Visit: Payer: Self-pay

## 2020-01-09 DIAGNOSIS — Z136 Encounter for screening for cardiovascular disorders: Secondary | ICD-10-CM

## 2020-01-09 DIAGNOSIS — E781 Pure hyperglyceridemia: Secondary | ICD-10-CM

## 2020-01-09 DIAGNOSIS — E782 Mixed hyperlipidemia: Secondary | ICD-10-CM

## 2020-01-10 ENCOUNTER — Encounter: Payer: Self-pay | Admitting: *Deleted

## 2020-01-10 LAB — CBC WITH DIFFERENTIAL/PLATELET
Absolute Monocytes: 590 cells/uL (ref 200–950)
Basophils Absolute: 53 cells/uL (ref 0–200)
Basophils Relative: 0.9 %
Eosinophils Absolute: 242 cells/uL (ref 15–500)
Eosinophils Relative: 4.1 %
HCT: 44.8 % (ref 38.5–50.0)
Hemoglobin: 15.2 g/dL (ref 13.2–17.1)
Lymphs Abs: 2862 cells/uL (ref 850–3900)
MCH: 31.4 pg (ref 27.0–33.0)
MCHC: 33.9 g/dL (ref 32.0–36.0)
MCV: 92.6 fL (ref 80.0–100.0)
MPV: 9.6 fL (ref 7.5–12.5)
Monocytes Relative: 10 %
Neutro Abs: 2154 cells/uL (ref 1500–7800)
Neutrophils Relative %: 36.5 %
Platelets: 194 10*3/uL (ref 140–400)
RBC: 4.84 10*6/uL (ref 4.20–5.80)
RDW: 12.4 % (ref 11.0–15.0)
Total Lymphocyte: 48.5 %
WBC: 5.9 10*3/uL (ref 3.8–10.8)

## 2020-01-10 LAB — LIPID PANEL
Cholesterol: 191 mg/dL (ref <200)
HDL: 42 mg/dL (ref >=40)
LDL Cholesterol (Calc): 122 mg/dL (calc) — ABNORMAL HIGH
Non-HDL Cholesterol (Calc): 149 mg/dL (calc) — ABNORMAL HIGH (ref <130)
Total CHOL/HDL Ratio: 4.5 (calc) (ref <5.0)
Triglycerides: 155 mg/dL — ABNORMAL HIGH (ref <150)

## 2020-01-10 LAB — COMPLETE METABOLIC PANEL WITH GFR
AG Ratio: 2.4 (calc) (ref 1.0–2.5)
ALT: 28 U/L (ref 9–46)
AST: 21 U/L (ref 10–35)
Albumin: 4.5 g/dL (ref 3.6–5.1)
Alkaline phosphatase (APISO): 66 U/L (ref 35–144)
BUN: 16 mg/dL (ref 7–25)
CO2: 28 mmol/L (ref 20–32)
Calcium: 9.5 mg/dL (ref 8.6–10.3)
Chloride: 106 mmol/L (ref 98–110)
Creat: 1.1 mg/dL (ref 0.70–1.25)
GFR, Est African American: 84 mL/min/{1.73_m2} (ref >=60)
GFR, Est Non African American: 73 mL/min/{1.73_m2} (ref >=60)
Globulin: 1.9 g/dL (calc) (ref 1.9–3.7)
Glucose, Bld: 89 mg/dL (ref 65–99)
Potassium: 4.4 mmol/L (ref 3.5–5.3)
Sodium: 142 mmol/L (ref 135–146)
Total Bilirubin: 0.4 mg/dL (ref 0.2–1.2)
Total Protein: 6.4 g/dL (ref 6.1–8.1)

## 2020-01-18 DIAGNOSIS — M4722 Other spondylosis with radiculopathy, cervical region: Secondary | ICD-10-CM | POA: Diagnosis not present

## 2020-01-18 DIAGNOSIS — Z6832 Body mass index (BMI) 32.0-32.9, adult: Secondary | ICD-10-CM | POA: Diagnosis not present

## 2020-01-18 DIAGNOSIS — M4324 Fusion of spine, thoracic region: Secondary | ICD-10-CM | POA: Diagnosis not present

## 2020-01-18 DIAGNOSIS — M4322 Fusion of spine, cervical region: Secondary | ICD-10-CM | POA: Diagnosis not present

## 2020-01-22 ENCOUNTER — Other Ambulatory Visit: Payer: Self-pay | Admitting: Family Medicine

## 2020-01-22 NOTE — Telephone Encounter (Signed)
Ok to refill 

## 2020-01-29 ENCOUNTER — Other Ambulatory Visit: Payer: Self-pay | Admitting: Family Medicine

## 2020-02-02 ENCOUNTER — Telehealth: Payer: Self-pay

## 2020-02-02 ENCOUNTER — Other Ambulatory Visit: Payer: Self-pay | Admitting: Orthopaedic Surgery

## 2020-02-02 DIAGNOSIS — M4722 Other spondylosis with radiculopathy, cervical region: Secondary | ICD-10-CM

## 2020-02-04 ENCOUNTER — Encounter: Payer: Self-pay | Admitting: Family Medicine

## 2020-02-06 ENCOUNTER — Other Ambulatory Visit: Payer: Self-pay

## 2020-02-06 ENCOUNTER — Ambulatory Visit (INDEPENDENT_AMBULATORY_CARE_PROVIDER_SITE_OTHER): Payer: No Typology Code available for payment source | Admitting: Family Medicine

## 2020-02-06 ENCOUNTER — Encounter: Payer: Self-pay | Admitting: Family Medicine

## 2020-02-06 VITALS — BP 138/80 | HR 68 | Temp 97.8°F | Resp 12 | Ht 72.0 in | Wt 237.0 lb

## 2020-02-06 DIAGNOSIS — B349 Viral infection, unspecified: Secondary | ICD-10-CM | POA: Diagnosis not present

## 2020-02-06 DIAGNOSIS — J019 Acute sinusitis, unspecified: Secondary | ICD-10-CM | POA: Diagnosis not present

## 2020-02-06 MED ORDER — AMOXICILLIN 875 MG PO TABS: 875.0000 mg | ORAL_TABLET | Freq: Two times a day (BID) | ORAL | 0 refills | Status: DC

## 2020-02-06 NOTE — Progress Notes (Signed)
   Subjective:    Patient ID: Derek [name], male    DOB: 11-Apr-1960, 59 y.o.   MRN: [id number]  Patient presents for Sinus Problem (Pt said that he has been feeling bad for about a week. He said that he has been tested for Covid and his at home test showed positive but the test at the health department says negative. No taste, sinus issues, weak and fatigued. Has had sore throat, been lightheaded and dizzy. )  Pt here with URI/head cold. Started Thursday evening he was in his shop and and though it was the pain fumes and the wood burning stove causing nasal congestion but then  Friday, he feltdizzy and felt bad in general . Friday evening  started with vomiting and diarrhea, loss of tate and smell intermittently. No cough, no fever He went to [facility] Monday had test which he thinks was PCR and it was negative But still feels bad, he feels like he either has flu or covid, with ongoing sinusitis symptoms He is tolerating fluids and food now He is using nyquil and dayquil  He did take a home test that was "positive"   COVID-19 Vaccine UTD   Review Of Systems:  GEN- denies fatigue, fever, weight loss,weakness, recent illness HEENT- denies eye drainage, change in vision, nasal discharge, CVS- denies chest pain, palpitations RESP- denies SOB, cough, wheeze ABD- denies N/V, change in stools, abd pain GU- denies dysuria, hematuria, dribbling, incontinence MSK- denies joint pain, muscle aches, injury Neuro- denies headache, dizziness, syncope, seizure activity       Objective:    BP 138/80   Pulse 68   Temp 97.8 F (36.6 C)   Resp 12   Ht 6' (1.829 m)   Wt 237 lb (107.5 kg)   SpO2 97%   BMI 32.14 kg/m   GEN- NAD, alert and oriented x3,fatigued appearing  HEENT- PERRL, EOMI, non injected sclera, pink conjunctiva, MMM, oropharynx clear , TM clear bilat no effusion,  + maxillary sinus tenderness, inflammed turbinates,  Nasal drainage  Neck- Supple, no LAD CVS- RRR, no  murmur RESP-CTAB EXT- No edema Pulses- Radial 2+          Assessment & Plan:      Problem List Items Addressed This Visit    None    Visit Diagnoses    Viral illness    -  Primary   his symptoms are consistent with possible COVID or flu, will run PCR testing today, will also cover sinusitis with amox, add oral antihistamine, tylenol Push fluids If positive sent for MAB infusion   Relevant Orders   COVID19 and Influenza A & B   Acute rhinosinusitis       Relevant Medications   amoxicillin (AMOXIL) 875 MG tablet      Note: This dictation was prepared with Dragon dictation along with smaller phrase technology. Any transcriptional errors that result from this process are unintentional.

## 2020-02-06 NOTE — Patient Instructions (Signed)
Keep hydrated Start antibiotics I will check FLU/COVID swab F/U pending results

## 2020-02-07 ENCOUNTER — Encounter: Payer: Self-pay | Admitting: Family Medicine

## 2020-02-07 DIAGNOSIS — R059 Cough, unspecified: Secondary | ICD-10-CM

## 2020-02-07 DIAGNOSIS — R0602 Shortness of breath: Secondary | ICD-10-CM

## 2020-02-07 LAB — SARS-COVID-2 RNA(COVID19)AND INFLUENZA A&B, QUALITATIVE NAAT
FLU A: NOT DETECTED
FLU B: NOT DETECTED
SARS CoV2 RNA: NOT DETECTED

## 2020-02-08 ENCOUNTER — Telehealth: Payer: Self-pay | Admitting: Neurology

## 2020-02-08 NOTE — Telephone Encounter (Signed)
Pt called, nheated hose to my APAP machine is broken. Called DME company, will be 6-7 days. I ask if I could come pick up the hose. Would allow me or a family member to pick it up due to positive home COVID test. Wanted to notify [facility] why I am not using my APAP machine.

## 2020-02-08 NOTE — Telephone Encounter (Signed)
Okay, noted

## 2020-02-09 MED ORDER — PANTOPRAZOLE SODIUM 40 MG PO TBEC
40.0000 mg | DELAYED_RELEASE_TABLET | Freq: Two times a day (BID) | ORAL | 2 refills | Status: DC
Start: 2020-02-09 — End: 2020-03-21

## 2020-02-12 ENCOUNTER — Other Ambulatory Visit: Payer: Self-pay

## 2020-02-12 ENCOUNTER — Ambulatory Visit (HOSPITAL_COMMUNITY)
Admission: RE | Admit: 2020-02-12 | Discharge: 2020-02-12 | Disposition: A | Discharge status: Home / Self Care | Payer: No Typology Code available for payment source | Source: Ambulatory Visit | Attending: Family Medicine | Admitting: Family Medicine

## 2020-02-12 DIAGNOSIS — R059 Cough, unspecified: Secondary | ICD-10-CM | POA: Insufficient documentation

## 2020-02-12 DIAGNOSIS — R0602 Shortness of breath: Secondary | ICD-10-CM | POA: Diagnosis not present

## 2020-02-12 NOTE — Telephone Encounter (Signed)
Phone call to patient to verify medication list and allergies for myelogram procedure. Medications pt is currently taking are safe to continue to take. Pt also instructed to have a driver the day of the procedure, it would take around 2 hours, and discharge instructions discussed. Pt verbalized understanding.

## 2020-02-14 MED ORDER — HYDROCOD POLST-CPM POLST ER 10-8 MG/5ML PO SUER: 5.0000 mL | Freq: Two times a day (BID) | ORAL | 0 refills | Status: DC | PRN

## 2020-02-14 NOTE — Addendum Note (Signed)
Addended by: Vic Blackbird F on: 02/14/2020 04:52 PM   Modules accepted: Orders

## 2020-02-19 ENCOUNTER — Other Ambulatory Visit: Payer: Self-pay | Admitting: Orthopaedic Surgery

## 2020-02-19 DIAGNOSIS — M4722 Other spondylosis with radiculopathy, cervical region: Secondary | ICD-10-CM

## 2020-02-23 ENCOUNTER — Ambulatory Visit
Admission: RE | Admit: 2020-02-23 | Discharge: 2020-02-23 | Disposition: A | Discharge status: Home / Self Care | Payer: Medicare Other | Source: Ambulatory Visit | Attending: Orthopaedic Surgery | Admitting: Orthopaedic Surgery

## 2020-02-23 DIAGNOSIS — M542 Cervicalgia: Secondary | ICD-10-CM | POA: Diagnosis not present

## 2020-02-23 DIAGNOSIS — R200 Anesthesia of skin: Secondary | ICD-10-CM | POA: Diagnosis not present

## 2020-02-23 DIAGNOSIS — Z981 Arthrodesis status: Secondary | ICD-10-CM | POA: Diagnosis not present

## 2020-02-23 DIAGNOSIS — M4722 Other spondylosis with radiculopathy, cervical region: Secondary | ICD-10-CM

## 2020-02-23 DIAGNOSIS — M4322 Fusion of spine, cervical region: Secondary | ICD-10-CM | POA: Diagnosis not present

## 2020-03-05 ENCOUNTER — Telehealth: Payer: Self-pay

## 2020-03-07 DIAGNOSIS — M4322 Fusion of spine, cervical region: Secondary | ICD-10-CM | POA: Diagnosis not present

## 2020-03-07 DIAGNOSIS — M4722 Other spondylosis with radiculopathy, cervical region: Secondary | ICD-10-CM | POA: Diagnosis not present

## 2020-03-07 DIAGNOSIS — M4324 Fusion of spine, thoracic region: Secondary | ICD-10-CM | POA: Diagnosis not present

## 2020-03-07 DIAGNOSIS — Z6832 Body mass index (BMI) 32.0-32.9, adult: Secondary | ICD-10-CM | POA: Diagnosis not present

## 2020-03-18 ENCOUNTER — Encounter: Payer: Self-pay | Admitting: Neurology

## 2020-03-21 ENCOUNTER — Encounter: Payer: Self-pay | Admitting: Family Medicine

## 2020-03-21 MED ORDER — PANTOPRAZOLE SODIUM 40 MG PO TBEC
40.0000 mg | DELAYED_RELEASE_TABLET | Freq: Two times a day (BID) | ORAL | 2 refills | Status: DC
Start: 2020-03-21 — End: 2020-03-22

## 2020-03-22 ENCOUNTER — Telehealth: Payer: Self-pay | Admitting: *Deleted

## 2020-03-22 MED ORDER — PANTOPRAZOLE SODIUM 40 MG PO TBEC
40.0000 mg | DELAYED_RELEASE_TABLET | Freq: Two times a day (BID) | ORAL | 2 refills | Status: DC
Start: 2020-03-22 — End: 2021-06-02

## 2020-03-22 NOTE — Telephone Encounter (Signed)
Received request from pharmacy for PA on Pantoprazole BID.   PA submitted.   Dx: K21.9- GERD.  Received immediate determination.   [id number] approved 03/22/2020- 03/22/2021.  Pharmacy made aware.

## 2020-03-27 DIAGNOSIS — M4722 Other spondylosis with radiculopathy, cervical region: Secondary | ICD-10-CM | POA: Diagnosis not present

## 2020-03-27 DIAGNOSIS — I10 Essential (primary) hypertension: Secondary | ICD-10-CM | POA: Diagnosis not present

## 2020-03-27 DIAGNOSIS — Z6832 Body mass index (BMI) 32.0-32.9, adult: Secondary | ICD-10-CM | POA: Diagnosis not present

## 2020-03-27 DIAGNOSIS — M4324 Fusion of spine, thoracic region: Secondary | ICD-10-CM | POA: Diagnosis not present

## 2020-04-04 ENCOUNTER — Other Ambulatory Visit: Payer: Self-pay

## 2020-04-04 ENCOUNTER — Ambulatory Visit (INDEPENDENT_AMBULATORY_CARE_PROVIDER_SITE_OTHER): Payer: Medicare Other | Admitting: Nurse Practitioner

## 2020-04-04 DIAGNOSIS — Z23 Encounter for immunization: Secondary | ICD-10-CM | POA: Diagnosis not present

## 2020-04-04 NOTE — Patient Instructions (Signed)
Pt came in for flu shot, received in left arm, tolerated well.

## 2020-04-08 ENCOUNTER — Other Ambulatory Visit: Payer: Self-pay | Admitting: Family Medicine

## 2020-04-08 NOTE — Telephone Encounter (Signed)
Ok to refill 

## 2020-04-10 ENCOUNTER — Other Ambulatory Visit: Payer: Self-pay | Admitting: Family Medicine

## 2020-04-10 NOTE — Telephone Encounter (Signed)
Ok to refill??  Last office visit 02/06/2020.  Last refill 12/14/2019, #2 refills.

## 2020-04-15 DIAGNOSIS — M5412 Radiculopathy, cervical region: Secondary | ICD-10-CM | POA: Diagnosis not present

## 2020-04-19 ENCOUNTER — Telehealth (INDEPENDENT_AMBULATORY_CARE_PROVIDER_SITE_OTHER): Payer: No Typology Code available for payment source | Admitting: Family Medicine

## 2020-04-19 ENCOUNTER — Other Ambulatory Visit: Payer: Self-pay

## 2020-04-19 ENCOUNTER — Encounter: Payer: Self-pay | Admitting: Family Medicine

## 2020-04-19 DIAGNOSIS — F411 Generalized anxiety disorder: Secondary | ICD-10-CM | POA: Diagnosis not present

## 2020-04-19 DIAGNOSIS — Z720 Tobacco use: Secondary | ICD-10-CM | POA: Diagnosis not present

## 2020-04-19 MED ORDER — LORAZEPAM 0.5 MG PO TABS: 0.5000 mg | ORAL_TABLET | Freq: Two times a day (BID) | ORAL | 1 refills | Status: DC | PRN

## 2020-04-19 MED ORDER — BUPROPION HCL ER (XL) 150 MG PO TB24: 150.0000 mg | ORAL_TABLET | Freq: Every day | ORAL | 2 refills | Status: DC

## 2020-04-19 NOTE — Progress Notes (Signed)
Virtual Visit via Video Note  I connected with Derek [name] on 04/19/20 at 4pm   by a video enabled telemedicine application and verified that I am speaking with the correct person using two identifiers.  Location: Patient: at home  Provider: in office   I discussed the limitations of evaluation and management by telemedicine and the availability of in person appointments. The patient expressed understanding and agreed to proceed.  History of Present Illness: Pt called in due to anxiety.  He states that he feels like he is on edge all the time.  He is not sure if it is associated with his chronic pain or just his nerves are bad.  He feels like he gets worked up a lot and control the anxiety.  He has been dipping tobacco even though he quit smoking many years ago because it helps with his nerves.  He recently tried to stop dipping tobacco but can only last a couple hours before his skin starts to crawl in the anxiety creeps up.  He has not had a full-blown anxiety attack.  His sleep is fair some nights he sleeps okay but if he is in a significant amount of pain with his back and he will not sleep.  He is unable to take his pain medication around-the-clock because it makes him feel sick.  But he is taking his Lyrica and his Flexeril which she typically takes 15 mg at bedtime.  He does feel like sometimes he can go without the Ambien and has been trying to taper himself off of the Ambien.  When asked about other medication he has been on in the past.  He has been given benzos to take before MRIs but he never really saw significant improvement in the anxiety as he often had to take this with a muscle relaxer to have his scans done.  He was given Cymbalta in the past but he had significant side effects with that medication.   Observations/Objective: GEN-NAD, alert and oriented RESP-normal WOB Psych- Normal affect and mood, pleasant, not anxious appearing   Assessment and Plan: Patient with  generalized anxiety in the setting of his chronic pain disorder - likely go hand-in-hand as he has suffered for quite some time.  However he has been using chewing tobacco as a way to help with his anxiety and  would like to come off of this.  After further discussing the medication possibilities.  His son is currently on Wellbutrin and he has not had any issues with that medication.  We discussed trying Wellbutrin which may also help with curbing his addiction to the nicotine.  Another option was Zoloft which he has not tried as well.  At this time we will try him on Wellbutrin 150 mg extended release once a day.  We will bridge him with lorazepam 0.5 mg he can take twice a day as needed for anxiety or sleep as he has come off of the Ambien.  Follow back up in 3 to 4 weeks to recheck medications.  Discussed side effects.  Follow Up Instructions: Feb 8th, 4pm    I discussed the assessment and treatment plan with the patient. The patient was provided an opportunity to ask questions and all were answered. The patient agreed with the plan and demonstrated an understanding of the instructions.   The patient was advised to call back or seek an in-person evaluation if the symptoms worsen or if the condition fails to improve as anticipated.  I provided  15 minutes of non-face-to-face time during this encounter. End Time: 4:15pm  Vic Blackbird, MD

## 2020-04-29 ENCOUNTER — Encounter: Payer: Self-pay | Admitting: Family Medicine

## 2020-05-01 ENCOUNTER — Encounter: Payer: Self-pay | Admitting: Family Medicine

## 2020-05-01 ENCOUNTER — Other Ambulatory Visit: Payer: Self-pay

## 2020-05-01 ENCOUNTER — Ambulatory Visit (INDEPENDENT_AMBULATORY_CARE_PROVIDER_SITE_OTHER): Payer: No Typology Code available for payment source | Admitting: Family Medicine

## 2020-05-01 VITALS — HR 94 | Temp 98.6°F | Resp 18

## 2020-05-01 DIAGNOSIS — Z20822 Contact with and (suspected) exposure to covid-19: Secondary | ICD-10-CM

## 2020-05-01 DIAGNOSIS — J019 Acute sinusitis, unspecified: Secondary | ICD-10-CM | POA: Diagnosis not present

## 2020-05-01 MED ORDER — AMOXICILLIN-POT CLAVULANATE 875-125 MG PO TABS: 1.0000 | ORAL_TABLET | Freq: Two times a day (BID) | ORAL | 0 refills | Status: DC

## 2020-05-01 NOTE — Progress Notes (Signed)
   Subjective:    Patient ID: Derek [name], male    DOB: Oct 12, 1960, 60 y.o.   MRN: [id number]  Patient presents for Sinusitis Patient evaluated in the car secondary to COVID-19 pandemic in viral symptoms.  He started with sinus pressure drainage sore throat 2 days ago.  He has had minimal cough.  He does feel achy all over and also has a headache.  His wife has been sick with similar symptoms however her Covid test have been negative.  She works for the hospital. Is not had any GI symptoms.  He has had his Covid shots.   Review Of Systems:  GEN- +fatigue, fever, weight loss,weakness, recent illness HEENT- denies eye drainage, change in vision,  +nasal discharge, CVS- denies chest pain, palpitations RESP- denies SOB, +cough, wheeze ABD- denies N/V, change in stools, abd pain GU- denies dysuria, hematuria, dribbling, incontinence MSK- denies joint pain,+ muscle aches, injury Neuro- denies headache, dizziness, syncope, seizure activity       Objective:    Pulse 94   Temp 98.6 F (37 C)   Resp 18   SpO2 95%  GEN- NAD, alert and oriented x3 HEENT- PERRL, EOMI, non injected sclera, pink conjunctiva, MMM, oropharynx clear, TM clear bilat no effusion,  + maxillary sinus tenderness, inflammed turbinates,  Nasal drainage  Neck- Supple, no LAD CVS- RRR, no murmur RESP-CTAB EXT- No edema Pulses- Radial 2+         Assessment & Plan:      Problem List Items Addressed This Visit   None   Visit Diagnoses    Suspected COVID-19 virus infection    -  Primary   Relevant Orders   SARS-COV-2 RNA,(COVID-19) QUAL NAAT   Acute rhinosinusitis       Concer for covid based on symptoms, he does have sinusitis issues, so start augmentin,continue sinus regimen, nasal steroid, mucinex D/ antihistamine Discussed red flags    Relevant Medications   amoxicillin-clavulanate (AUGMENTIN) 875-125 MG tablet      Note: This dictation was prepared with Dragon dictation along with smaller phrase  technology. Any transcriptional errors that result from this process are unintentional.

## 2020-05-01 NOTE — Patient Instructions (Signed)
COVID test will results in Mychart  Continue mucinex

## 2020-05-03 LAB — SARS-COV-2 RNA,(COVID-19) QUALITATIVE NAAT: SARS CoV2 RNA: NOT DETECTED

## 2020-05-07 DEATH — deceased

## 2020-05-14 ENCOUNTER — Telehealth: Payer: Self-pay | Admitting: Family Medicine

## 2020-05-15 ENCOUNTER — Encounter: Payer: Self-pay | Admitting: Neurology

## 2020-05-15 ENCOUNTER — Telehealth: Payer: Self-pay

## 2020-05-15 ENCOUNTER — Encounter: Payer: Self-pay | Admitting: Family Medicine

## 2020-05-15 ENCOUNTER — Ambulatory Visit (INDEPENDENT_AMBULATORY_CARE_PROVIDER_SITE_OTHER): Payer: No Typology Code available for payment source | Admitting: Neurology

## 2020-05-15 ENCOUNTER — Other Ambulatory Visit: Payer: Self-pay

## 2020-05-15 VITALS — BP 127/81 | HR 89 | Ht 72.0 in | Wt 239.0 lb

## 2020-05-15 DIAGNOSIS — G43709 Chronic migraine without aura, not intractable, without status migrainosus: Secondary | ICD-10-CM | POA: Diagnosis not present

## 2020-05-15 DIAGNOSIS — G4733 Obstructive sleep apnea (adult) (pediatric): Secondary | ICD-10-CM

## 2020-05-15 MED ORDER — TOPIRAMATE 100 MG PO TABS: 100.0000 mg | ORAL_TABLET | Freq: Every day | ORAL | 3 refills | Status: DC

## 2020-05-15 NOTE — Telephone Encounter (Signed)
I would need to recheck his right ear to see if it is infected It could just be fluid from recent infection He can try a decongestant if he hasnt taken already such as PE  or Sudafed if tolerated

## 2020-05-15 NOTE — Patient Instructions (Signed)
Try to wean down the Topamax to 50 mg, monitor for worsening headaches Try to use the CPAP > 4 hours every night See you back in 1 year
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
## 2020-05-15 NOTE — Addendum Note (Signed)
Addended by: Suzzanne Cloud on: 05/15/2020 08:26 AM   Modules accepted: Orders

## 2020-05-15 NOTE — Telephone Encounter (Signed)
Sent message to front office to schedule pt for appt with pcp or np

## 2020-05-15 NOTE — Progress Notes (Signed)
Cm sent to [facility]

## 2020-05-16 ENCOUNTER — Ambulatory Visit (INDEPENDENT_AMBULATORY_CARE_PROVIDER_SITE_OTHER): Payer: No Typology Code available for payment source | Admitting: Nurse Practitioner

## 2020-05-16 ENCOUNTER — Encounter: Payer: Self-pay | Admitting: Nurse Practitioner

## 2020-05-16 ENCOUNTER — Other Ambulatory Visit: Payer: Self-pay

## 2020-05-16 VITALS — BP 120/86 | HR 97 | Temp 97.0°F | Ht 72.0 in | Wt 247.0 lb

## 2020-05-16 DIAGNOSIS — J309 Allergic rhinitis, unspecified: Secondary | ICD-10-CM

## 2020-05-16 MED ORDER — FLUTICASONE PROPIONATE 50 MCG/ACT NA SUSP: 2.0000 | Freq: Every day | NASAL | 6 refills | Status: DC

## 2020-05-16 MED ORDER — LORATADINE 10 MG PO TABS: 10.0000 mg | ORAL_TABLET | Freq: Every day | ORAL | 3 refills | Status: DC

## 2020-05-16 NOTE — Progress Notes (Signed)
Subjective:    Patient ID: Derek [name], male    DOB: 05/13/1960, 60 y.o.   MRN: [id number]  HPI: Derek [name] is a 60 y.o. male presenting for ear pain.  Chief Complaint  Patient presents with  . Follow-up    Right ear is giving him pain, covid neg, still coughing. Was given amoxicillin,took all of that. Doing otc for swimming ear, not working   EAR PAIN Reports throat is better; right ear is constantly hurting; cough is about the same. Duration: weeks Involved ear(s): right Severity:  moderate  Quality:  Sharp when swallowing, otherwise feels like there is water Fever: no Otorrhea: no Upper respiratory infection symptoms: no Pruritus: no Hearing loss: yes Water immersion no Using Q-tips: yes Recurrent otitis media: no Status: stable Treatments attempted: OTC swimmer's ear  UPPER RESPIRATORY TRACT INFECTION Onset: 2 weeks Worst symptom: tickly cough Fever: no Cough: dry, tickly cough Shortness of breath: no Wheezing: no Chest pain: no Chest tightness: no Chest congestion: no Nasal congestion: only when laying down at night Runny nose: no Post nasal drip: yes Sneezing: no Sore throat: no Swollen glands: no Sinus pressure: no Headache: no Face pain: no Toothache: no Ear pain: yes; R ear  Ear pressure: yes; feels like water  Eyes red/itching:no Eye drainage/crusting: no  Nausea: no  Vomiting: no Diarrhea: no  Change in appetite: no  Loss of taste/smell: no  Rash: no Fatigue: no Sick contacts: no Strep contacts: no  Context: stable Recurrent sinusitis: no Treatments attempted:OTC swimmer's ear drops  Relief with OTC medications: no  Allergies  Allergen Reactions  . Cymbalta [Duloxetine Hcl]     Change in moodmental     Outpatient Encounter Medications as of 05/16/2020  Medication Sig  . aspirin 325 MG tablet Take 81 mg by mouth daily.  [name] Kitchen buPROPion (WELLBUTRIN XL) 150 MG 24 hr tablet Take 1 tablet (150 mg total) by mouth daily.  .  butalbital-acetaminophen-caffeine (FIORICET) 50-325-40 MG tablet Take 1 tablet by mouth every 6 (six) hours as needed for headache.  . cyclobenzaprine (FLEXERIL) 5 MG tablet TAKE 1 TABLET BY MOUTH THREE TIMES DAILY AS NEEDED FOR MUSCLE SPASMS  . ezetimibe (ZETIA) 10 MG tablet Take 1 tablet (10 mg total) by mouth daily.  . fluticasone (FLONASE) 50 MCG/ACT nasal spray Place 2 sprays into both nostrils daily.  [name] Kitchen HYDROcodone-acetaminophen (NORCO) 10-325 MG tablet Take 1 tablet by mouth every 6 (six) hours as needed.  . linaclotide (LINZESS) 145 MCG CAPS capsule TAKE 1 CAPSULE BY MOUTH ONCE DAILY ON AN EMPTY STOMACH  . LORazepam (ATIVAN) 0.5 MG tablet Take 1 tablet (0.5 mg total) by mouth 2 (two) times daily as needed for anxiety.  . Multiple Vitamin (MULTIVITAMIN) tablet Take 1 tablet by mouth daily.  . pantoprazole (PROTONIX) 40 MG tablet Take 1 tablet (40 mg total) by mouth 2 (two) times daily.  . pregabalin (LYRICA) 150 MG capsule TAKE 1 CAPSULE(150 MG) BY MOUTH TWICE DAILY  . topiramate (TOPAMAX) 100 MG tablet Take 1 tablet (100 mg total) by mouth at bedtime.  . vitamin C (ASCORBIC ACID) 500 MG tablet Take 1,000 mg by mouth daily.   [name] Kitchen zolpidem (AMBIEN) 10 MG tablet TAKE 1 TABLET BY MOUTH EVERY NIGHT AT BEDTIME  . [DISCONTINUED] amoxicillin-clavulanate (AUGMENTIN) 875-125 MG tablet Take 1 tablet by mouth 2 (two) times daily.  . [DISCONTINUED] loratadine (CLARITIN) 10 MG tablet Take 1 tablet (10 mg total) by mouth daily. OTC  . loratadine (CLARITIN) 10  MG tablet Take 1 tablet (10 mg total) by mouth daily. OTC   No facility-administered encounter medications on file as of 05/16/2020.    Patient Active Problem List   Diagnosis Date Noted  . Chews tobacco 04/19/2020  . GAD (generalized anxiety disorder) 04/19/2020  . Chronic back pain 07/17/2019  . Migraines 07/17/2019  . Low testosterone 10/19/2017  . Situational depression 10/19/2017  . Status post spinal surgery 02/04/2016  . Claustrophobia  06/04/2015  . Diverticulosis of large intestine without perforation or abscess without bleeding 06/04/2015  . Thyroid cyst 05/17/2015  . History of colonic polyps   . Diverticulosis of colon without hemorrhage   . Obesity 07/18/2013  . Right knee pain 03/18/2013  . Insomnia 01/04/2013  . Benign prostatic hyperplasia 09/05/2012  . Sleep apnea 09/05/2012  . Degenerative disc disease, lumbar 04/20/2012  . Hypertriglyceridemia 04/20/2012  . GERD (gastroesophageal reflux disease) 04/20/2012  . Other specified complications of surgical and medical care, not elsewhere classified, initial encounter 04/20/2012  . Constipation 03/17/2011    Past Medical History:  Diagnosis Date  . Arthritis   . Back problem   . BPH (benign prostatic hypertrophy)   . Cystadenoma    of the pancreas s/p segmental resection of the pancreas by Dr. Romona Curls  . Diverticulosis   . ED (erectile dysfunction)   . Family history of colonic polyps 03/17/2011   Brother, age 79   . Gallstone pancreatitis   . GERD (gastroesophageal reflux disease)   . H. pylori infection 8/98   treated  . Hypercholesteremia   . Hyperlipidemia   . Pancreatic disease   . Sleep apnea   . Solitary kidney, congenital    abscent right kidney    Relevant past medical, surgical, family and social history reviewed and updated as indicated. Interim medical history since our last visit reviewed.  Review of Systems Per HPI unless specifically indicated above     Objective:    BP 120/86   Pulse 97   Temp (!) 97 F (36.1 C)   Ht 6' (1.829 m)   Wt 247 lb (112 kg)   SpO2 97%   BMI 33.50 kg/m   Wt Readings from Last 3 Encounters:  05/16/20 247 lb (112 kg)  05/15/20 239 lb (108.4 kg)  02/06/20 237 lb (107.5 kg)    Physical Exam Vitals and nursing note reviewed.  Constitutional:      General: He is not in acute distress.    Appearance: Normal appearance. He is not toxic-appearing.  HENT:     Head: Normocephalic and atraumatic.      Right Ear: Tympanic membrane, ear canal and external ear normal.     Left Ear: Tympanic membrane, ear canal and external ear normal.     Nose: Nose normal. No congestion.     Mouth/Throat:     Mouth: Mucous membranes are moist.     Pharynx: Oropharynx is clear. Posterior oropharyngeal erythema (mild) present.  Eyes:     General: No scleral icterus.    Extraocular Movements: Extraocular movements intact.  Pulmonary:     Effort: Pulmonary effort is normal. No respiratory distress.  Musculoskeletal:     Cervical back: Normal range of motion.  Lymphadenopathy:     Cervical: No cervical adenopathy.  Skin:    General: Skin is warm and dry.     Capillary Refill: Capillary refill takes less than 2 seconds.     Coloration: Skin is not jaundiced or pale.     Findings: No  erythema.  Neurological:     Mental Status: He is alert and oriented to person, place, and time.     Motor: No weakness.     Gait: Gait normal.  Psychiatric:        Mood and Affect: Mood normal.        Behavior: Behavior normal.        Thought Content: Thought content normal.        Judgment: Judgment normal.    Results for orders placed or performed in visit on 05/01/20  SARS-COV-2 RNA,(COVID-19) QUAL NAAT   Specimen: Respiratory  Result Value Ref Range   SARS CoV2 RNA Not Detected Not Detect      Assessment & Plan:  1. Allergic rhinitis, unspecified seasonality, unspecified trigger Acute, ongoing.  TMs without effusion or erythema today.   Question allergic response vs. Post viral post nasal drip.  Resume Claritin and start daily flonase.  If symptoms do not gradually improve, return to clinic.  - fluticasone (FLONASE) 50 MCG/ACT nasal spray; Place 2 sprays into both nostrils daily.  Dispense: 16 g; Refill: 6 - loratadine (CLARITIN) 10 MG tablet; Take 1 tablet (10 mg total) by mouth daily. OTC  Dispense: 90 tablet; Refill: 3    Follow up plan: Return if symptoms worsen or fail to improve.

## 2020-05-21 ENCOUNTER — Other Ambulatory Visit: Payer: Self-pay

## 2020-05-21 ENCOUNTER — Encounter: Payer: Self-pay | Admitting: Family Medicine

## 2020-05-21 ENCOUNTER — Telehealth (INDEPENDENT_AMBULATORY_CARE_PROVIDER_SITE_OTHER): Payer: No Typology Code available for payment source | Admitting: Family Medicine

## 2020-05-21 DIAGNOSIS — J309 Allergic rhinitis, unspecified: Secondary | ICD-10-CM

## 2020-05-21 DIAGNOSIS — Z6830 Body mass index (BMI) 30.0-30.9, adult: Secondary | ICD-10-CM

## 2020-05-21 DIAGNOSIS — F5101 Primary insomnia: Secondary | ICD-10-CM | POA: Diagnosis not present

## 2020-05-21 DIAGNOSIS — E6609 Other obesity due to excess calories: Secondary | ICD-10-CM | POA: Diagnosis not present

## 2020-05-21 DIAGNOSIS — F411 Generalized anxiety disorder: Secondary | ICD-10-CM | POA: Diagnosis not present

## 2020-05-21 MED ORDER — FLUTICASONE PROPIONATE 50 MCG/ACT NA SUSP: 2.0000 | Freq: Every day | NASAL | 6 refills | Status: DC

## 2020-05-21 MED ORDER — TOPIRAMATE 100 MG PO TABS
50.0000 mg | ORAL_TABLET | Freq: Every day | ORAL | 3 refills | Status: DC
Start: 2020-05-21 — End: 2020-06-05

## 2020-05-21 NOTE — Progress Notes (Signed)
Virtual Visit via Telephone Note  I connected with Derek [name] on 05/21/20 at  2:08 PM EST by telephone and verified that I am speaking with the correct person using two identifiers.  Called and LVM 2:04pm  Location: Patient: at home  Provider: in office    I discussed the limitations, risks, security and privacy concerns of performing an evaluation and management service by telephone and the availability of in person appointments. I also discussed with the patient that there may be a patient responsible charge related to this service. The patient expressed understanding and agreed to proceed.   History of Present Illness: Telehealth visit in the setting of COVID-19 pandemic.  Patient was following up with medications for his nicotine addiction and anxiety.  He has seen some improvement in his mood and he is reduced his dipping significantly.  He is also sleeping pretty good.  He does have lorazepam on hand to take this as needed.  He was recently seen for upper respiratory sinusitis that has now improved.  Flonase helped clear up the ear pressure and postnasal drip.  He would like to have this refilled.  He is still falling with neurology for his migraines.  He has been able to taper his Topamax down to 50 mg at bedtime.  Obesity we discussed he is trying to meal prep with his wife but he admits that he is eating more junk food than he should.  He is trying to eat at least 1 salad a day and increase his veggies.   Observations/Objective: NAD , alert and oriented x3 resp- NORMAL wob PSYCH- normal affect and mood   Assessment and Plan: Anxiety-continue Wellbutrin 150 mg once a day lorazepam as needed.  Ambien for insomnia.  Nicotine/vaping improved with Wellbutrin.  Obesity we discussed dietary changes.  He does have hypercholesteremia associated.  He will have labs done at his upcoming physical.  I do believe that he benefits from being on Topamax as this can help curb the appetite.   For now he is taking 50 mg at bedtime    Follow Up Instructions:    I discussed the assessment and treatment plan with the patient. The patient was provided an opportunity to ask questions and all were answered. The patient agreed with the plan and demonstrated an understanding of the instructions.   The patient was advised to call back or seek an in-person evaluation if the symptoms worsen or if the condition fails to improve as anticipated.  I provided 9  minutes of non-face-to-face time during this encounter. End Time 2:19pm   Vic Blackbird, MD

## 2020-05-27 ENCOUNTER — Other Ambulatory Visit: Payer: Self-pay | Admitting: Family Medicine

## 2020-05-29 DIAGNOSIS — M4322 Fusion of spine, cervical region: Secondary | ICD-10-CM | POA: Diagnosis not present

## 2020-05-29 DIAGNOSIS — M960 Pseudarthrosis after fusion or arthrodesis: Secondary | ICD-10-CM | POA: Diagnosis not present

## 2020-05-29 DIAGNOSIS — M5412 Radiculopathy, cervical region: Secondary | ICD-10-CM | POA: Diagnosis not present

## 2020-05-29 DIAGNOSIS — M4722 Other spondylosis with radiculopathy, cervical region: Secondary | ICD-10-CM | POA: Diagnosis not present

## 2020-06-03 ENCOUNTER — Encounter: Payer: Self-pay | Admitting: Neurology

## 2020-06-05 ENCOUNTER — Other Ambulatory Visit: Payer: Self-pay

## 2020-06-05 ENCOUNTER — Ambulatory Visit (INDEPENDENT_AMBULATORY_CARE_PROVIDER_SITE_OTHER): Payer: No Typology Code available for payment source | Admitting: Nurse Practitioner

## 2020-06-05 VITALS — BP 136/84 | HR 95 | Temp 97.5°F | Wt 247.0 lb

## 2020-06-05 DIAGNOSIS — R1012 Left upper quadrant pain: Secondary | ICD-10-CM | POA: Diagnosis not present

## 2020-06-05 LAB — CBC WITH DIFFERENTIAL/PLATELET
Absolute Monocytes: 832 cells/uL (ref 200–950)
Basophils Absolute: 51 cells/uL (ref 0–200)
Basophils Relative: 0.7 %
Eosinophils Absolute: 161 cells/uL (ref 15–500)
Eosinophils Relative: 2.2 %
HCT: 43.2 % (ref 38.5–50.0)
Hemoglobin: 15 g/dL (ref 13.2–17.1)
Lymphs Abs: 2307 cells/uL (ref 850–3900)
MCH: 32 pg (ref 27.0–33.0)
MCHC: 34.7 g/dL (ref 32.0–36.0)
MCV: 92.1 fL (ref 80.0–100.0)
MPV: 10 fL (ref 7.5–12.5)
Monocytes Relative: 11.4 %
Neutro Abs: 3949 cells/uL (ref 1500–7800)
Neutrophils Relative %: 54.1 %
Platelets: 221 10*3/uL (ref 140–400)
RBC: 4.69 10*6/uL (ref 4.20–5.80)
RDW: 12.6 % (ref 11.0–15.0)
Total Lymphocyte: 31.6 %
WBC: 7.3 10*3/uL (ref 3.8–10.8)

## 2020-06-05 LAB — URINALYSIS, ROUTINE W REFLEX MICROSCOPIC
Bilirubin Urine: NEGATIVE
Glucose, UA: NEGATIVE
Hgb urine dipstick: NEGATIVE
Ketones, ur: NEGATIVE
Leukocytes,Ua: NEGATIVE
Nitrite: NEGATIVE
Protein, ur: NEGATIVE
Specific Gravity, Urine: 1.02 (ref 1.001–1.03)
pH: 6 (ref 5.0–8.0)

## 2020-06-05 LAB — COMPLETE METABOLIC PANEL WITH GFR
AG Ratio: 2.1 (calc) (ref 1.0–2.5)
ALT: 59 U/L — ABNORMAL HIGH (ref 9–46)
AST: 42 U/L — ABNORMAL HIGH (ref 10–35)
Albumin: 4.4 g/dL (ref 3.6–5.1)
Alkaline phosphatase (APISO): 63 U/L (ref 35–144)
BUN: 15 mg/dL (ref 7–25)
CO2: 32 mmol/L (ref 20–32)
Calcium: 9.2 mg/dL (ref 8.6–10.3)
Chloride: 103 mmol/L (ref 98–110)
Creat: 1.08 mg/dL (ref 0.70–1.25)
GFR, Est African American: 85 mL/min/{1.73_m2} (ref >=60)
GFR, Est Non African American: 74 mL/min/{1.73_m2} (ref >=60)
Globulin: 2.1 g/dL (calc) (ref 1.9–3.7)
Glucose, Bld: 87 mg/dL (ref 65–99)
Potassium: 4 mmol/L (ref 3.5–5.3)
Sodium: 142 mmol/L (ref 135–146)
Total Bilirubin: 0.5 mg/dL (ref 0.2–1.2)
Total Protein: 6.5 g/dL (ref 6.1–8.1)

## 2020-06-05 LAB — AMYLASE: Amylase: 56 U/L (ref 21–101)

## 2020-06-05 LAB — LIPASE: Lipase: 48 U/L (ref 7–60)

## 2020-06-05 MED ORDER — FAMOTIDINE 20 MG PO TABS: 20.0000 mg | ORAL_TABLET | Freq: Two times a day (BID) | ORAL | 0 refills | Status: DC

## 2020-06-05 NOTE — Patient Instructions (Addendum)
F/u pending lab results

## 2020-06-05 NOTE — Progress Notes (Signed)
Subjective:    Patient ID: Derek [name], male    DOB: 1960-12-20, 59 y.o.   MRN: [id number]  HPI: Derek [name] is a 60 y.o. male presenting for pain on left side.  Chief Complaint  Patient presents with  . Pain    Pain on the left side, this has been happening for a few months. Taking hydrocodone, not working for pain. Did have gallbladder removed some yrs ago.   ABDOMINAL PAIN  Duration: months Onset: sudden Severity: medium to severe Quality: nagging, sharp pain Location:   Bottom of sternum and pain runs along ribs on left side; RUQ Episode duration: minutes Radiation: no Frequency: multiple times per day, randomly Alleviating factors: nothing Aggravating factors: soda or beer Status:stable Treatments attempted: Gas -Ex, Magnesium citrate, Fever: no Nausea: no Vomiting: no Weight loss: no Decreased appetite: no Diarrhea: no Constipation: no Blood in stool: no Heartburn: no Jaundice: no Rash: no Dysuria/urinary frequency: no  Hematuria: no History of sexually transmitted disease: no Recurrent NSAID use: no   Reports about 20 years ago, he had a "tumor" removed from his pancreas.  Allergies  Allergen Reactions  . Cymbalta [Duloxetine Hcl]     Change in moodmental     Outpatient Encounter Medications as of 06/05/2020  Medication Sig  . aspirin 325 MG tablet Take 81 mg by mouth daily.  [name] Kitchen buPROPion (WELLBUTRIN XL) 150 MG 24 hr tablet Take 1 tablet (150 mg total) by mouth daily.  . butalbital-acetaminophen-caffeine (FIORICET) 50-325-40 MG tablet Take 1 tablet by mouth every 6 (six) hours as needed for headache.  . cyclobenzaprine (FLEXERIL) 5 MG tablet TAKE 1 TABLET BY MOUTH THREE TIMES DAILY AS NEEDED FOR MUSCLE SPASMS  . ezetimibe (ZETIA) 10 MG tablet Take 1 tablet (10 mg total) by mouth daily.  . famotidine (PEPCID) 20 MG tablet Take 1 tablet (20 mg total) by mouth 2 (two) times daily.  . fluticasone (FLONASE) 50 MCG/ACT nasal spray Place 2 sprays into  both nostrils daily.  [name] Kitchen HYDROcodone-acetaminophen (NORCO) 10-325 MG tablet Take 1 tablet by mouth every 6 (six) hours as needed.  . linaclotide (LINZESS) 145 MCG CAPS capsule TAKE 1 CAPSULE BY MOUTH ONCE DAILY ON AN EMPTY STOMACH  . loratadine (CLARITIN) 10 MG tablet Take 1 tablet (10 mg total) by mouth daily. OTC  . LORazepam (ATIVAN) 0.5 MG tablet Take 1 tablet (0.5 mg total) by mouth 2 (two) times daily as needed for anxiety.  . Multiple Vitamin (MULTIVITAMIN) tablet Take 1 tablet by mouth daily.  . pantoprazole (PROTONIX) 40 MG tablet Take 1 tablet (40 mg total) by mouth 2 (two) times daily.  . pregabalin (LYRICA) 150 MG capsule TAKE 1 CAPSULE(150 MG) BY MOUTH TWICE DAILY  . vitamin C (ASCORBIC ACID) 500 MG tablet Take 1,000 mg by mouth daily.   [name] Kitchen zolpidem (AMBIEN) 10 MG tablet TAKE 1 TABLET BY MOUTH EVERY NIGHT AT BEDTIME  . [DISCONTINUED] topiramate (TOPAMAX) 100 MG tablet Take 0.5 tablets (50 mg total) by mouth at bedtime.   No facility-administered encounter medications on file as of 06/05/2020.    Patient Active Problem List   Diagnosis Date Noted  . Chews tobacco 04/19/2020  . GAD (generalized anxiety disorder) 04/19/2020  . Chronic back pain 07/17/2019  . Migraines 07/17/2019  . Low testosterone 10/19/2017  . Status post spinal surgery 02/04/2016  . Claustrophobia 06/04/2015  . Diverticulosis of large intestine without perforation or abscess without bleeding 06/04/2015  . Thyroid cyst 05/17/2015  . History  of colonic polyps   . Diverticulosis of colon without hemorrhage   . Obesity 07/18/2013  . Right knee pain 03/18/2013  . Insomnia 01/04/2013  . Benign prostatic hyperplasia 09/05/2012  . Sleep apnea 09/05/2012  . Degenerative disc disease, lumbar 04/20/2012  . Hypertriglyceridemia 04/20/2012  . GERD (gastroesophageal reflux disease) 04/20/2012  . Other specified complications of surgical and medical care, not elsewhere classified, initial encounter 04/20/2012  .  Constipation 03/17/2011    Past Medical History:  Diagnosis Date  . Arthritis   . Back problem   . BPH (benign prostatic hypertrophy)   . Cystadenoma    of the pancreas s/p segmental resection of the pancreas by Dr. Romona Curls  . Diverticulosis   . ED (erectile dysfunction)   . Family history of colonic polyps 03/17/2011   Brother, age 34   . Gallstone pancreatitis   . GERD (gastroesophageal reflux disease)   . H. pylori infection 8/98   treated  . Hypercholesteremia   . Hyperlipidemia   . Pancreatic disease   . Sleep apnea   . Solitary kidney, congenital    abscent right kidney    Relevant past medical, surgical, family and social history reviewed and updated as indicated. Interim medical history since our last visit reviewed.  Review of Systems  Constitutional: Negative.  Negative for activity change, appetite change, diaphoresis, fatigue, fever and unexpected weight change.  Gastrointestinal: Positive for abdominal pain. Negative for abdominal distention, anal bleeding, blood in stool, constipation, diarrhea, nausea, rectal pain and vomiting.  Skin: Negative.  Negative for color change and rash.  Neurological: Negative.   Psychiatric/Behavioral: Negative.    Per HPI unless specifically indicated above     Objective:    BP 136/84   Pulse 95   Temp (!) 97.5 F (36.4 C)   Wt 247 lb (112 kg)   BMI 33.50 kg/m   Wt Readings from Last 3 Encounters:  06/05/20 247 lb (112 kg)  05/16/20 247 lb (112 kg)  05/15/20 239 lb (108.4 kg)    Physical Exam Vitals and nursing note reviewed.  Constitutional:      General: He is not in acute distress.    Appearance: Normal appearance. He is obese. He is not toxic-appearing.  Eyes:     General: No scleral icterus.    Extraocular Movements: Extraocular movements intact.  Cardiovascular:     Rate and Rhythm: Regular rhythm.     Heart sounds: Normal heart sounds. No murmur heard.   Pulmonary:     Effort: Pulmonary effort is  normal. No respiratory distress.     Breath sounds: Normal breath sounds. No wheezing, rhonchi or rales.  Abdominal:     General: Abdomen is flat. Bowel sounds are normal. There is no distension or abdominal bruit.     Palpations: Abdomen is soft. There is no hepatomegaly, splenomegaly or mass.     Tenderness: There is no abdominal tenderness. There is no right CVA tenderness, left CVA tenderness or guarding.  Musculoskeletal:     Cervical back: Normal range of motion and neck supple.  Lymphadenopathy:     Cervical: No cervical adenopathy.  Skin:    General: Skin is warm and dry.     Capillary Refill: Capillary refill takes less than 2 seconds.     Coloration: Skin is not jaundiced or pale.     Findings: No erythema.  Neurological:     Mental Status: He is alert and oriented to person, place, and time.  Motor: No weakness.     Gait: Gait normal.  Psychiatric:        Mood and Affect: Mood normal.        Behavior: Behavior normal.        Thought Content: Thought content normal.        Judgment: Judgment normal.       Assessment & Plan:  1. LUQ pain Acute.  Given history of acid reflux and pain originates around epigastrium, will start famotidine 20 mg twice daily.  Will check blood counts to rule out spleen involvement, CMET with GFR to check liver enzymes and kidney function with electrolytes.  Check amylase and lipase given history of pancreatic tumor, although pancreatitis seems unlikely.  Check urinalysis to check for blood/possible kidney stone.  Follow up pending lab results.   - famotidine (PEPCID) 20 MG tablet; Take 1 tablet (20 mg total) by mouth 2 (two) times daily.  Dispense: 28 tablet; Refill: 0 - CBC with Differential - COMPLETE METABOLIC PANEL WITH GFR - Lipase - Amylase - Urinalysis, Routine w reflex microscopic    Follow up plan: Return if symptoms worsen or fail to improve.

## 2020-06-06 ENCOUNTER — Encounter: Payer: Self-pay | Admitting: Nurse Practitioner

## 2020-06-06 DIAGNOSIS — R7989 Other specified abnormal findings of blood chemistry: Secondary | ICD-10-CM

## 2020-06-06 DIAGNOSIS — R1012 Left upper quadrant pain: Secondary | ICD-10-CM

## 2020-06-06 DIAGNOSIS — Z90410 Acquired total absence of pancreas: Secondary | ICD-10-CM

## 2020-06-24 ENCOUNTER — Other Ambulatory Visit: Payer: Self-pay | Admitting: *Deleted

## 2020-06-24 DIAGNOSIS — R7989 Other specified abnormal findings of blood chemistry: Secondary | ICD-10-CM

## 2020-06-25 ENCOUNTER — Other Ambulatory Visit: Payer: Medicare Other

## 2020-06-25 ENCOUNTER — Other Ambulatory Visit: Payer: Self-pay

## 2020-06-25 DIAGNOSIS — R7989 Other specified abnormal findings of blood chemistry: Secondary | ICD-10-CM

## 2020-06-25 LAB — COMPLETE METABOLIC PANEL WITH GFR
AG Ratio: 2 (calc) (ref 1.0–2.5)
ALT: 31 U/L (ref 9–46)
AST: 27 U/L (ref 10–35)
Albumin: 4.4 g/dL (ref 3.6–5.1)
Alkaline phosphatase (APISO): 68 U/L (ref 35–144)
BUN: 10 mg/dL (ref 7–25)
CO2: 32 mmol/L (ref 20–32)
Calcium: 9.4 mg/dL (ref 8.6–10.3)
Chloride: 103 mmol/L (ref 98–110)
Creat: 1.1 mg/dL (ref 0.70–1.25)
GFR, Est African American: 84 mL/min/{1.73_m2} (ref >=60)
GFR, Est Non African American: 72 mL/min/{1.73_m2} (ref >=60)
Globulin: 2.2 g/dL (calc) (ref 1.9–3.7)
Glucose, Bld: 82 mg/dL (ref 65–99)
Potassium: 4.4 mmol/L (ref 3.5–5.3)
Sodium: 142 mmol/L (ref 135–146)
Total Bilirubin: 0.7 mg/dL (ref 0.2–1.2)
Total Protein: 6.6 g/dL (ref 6.1–8.1)

## 2020-07-03 ENCOUNTER — Other Ambulatory Visit: Payer: Self-pay | Admitting: Family Medicine

## 2020-07-03 NOTE — Telephone Encounter (Signed)
Ok to refill??  Last office visit 06/06/2020  Last refill 05/28/2020.

## 2020-07-06 MED ORDER — LOSARTAN POTASSIUM 50 MG PO TABS
ORAL_TABLET | 3 refills | 90.00000 days
Start: 2020-07-06 — End: ?

## 2020-07-08 ENCOUNTER — Encounter: Payer: Self-pay | Admitting: Nurse Practitioner

## 2020-07-08 ENCOUNTER — Encounter (HOSPITAL_COMMUNITY): Payer: Self-pay

## 2020-07-08 ENCOUNTER — Ambulatory Visit (HOSPITAL_COMMUNITY)
Admission: RE | Admit: 2020-07-08 | Discharge: 2020-07-08 | Disposition: A | Discharge status: Home / Self Care | Payer: No Typology Code available for payment source | Source: Ambulatory Visit | Attending: Nurse Practitioner | Admitting: Nurse Practitioner

## 2020-07-08 DIAGNOSIS — R7989 Other specified abnormal findings of blood chemistry: Secondary | ICD-10-CM | POA: Diagnosis not present

## 2020-07-08 DIAGNOSIS — R1012 Left upper quadrant pain: Secondary | ICD-10-CM | POA: Diagnosis not present

## 2020-07-08 DIAGNOSIS — Z90410 Acquired total absence of pancreas: Secondary | ICD-10-CM | POA: Diagnosis not present

## 2020-07-08 DIAGNOSIS — R109 Unspecified abdominal pain: Secondary | ICD-10-CM | POA: Diagnosis not present

## 2020-07-08 MED ORDER — IOHEXOL 300 MG/ML  SOLN
100.0000 mL | Freq: Once | INTRAMUSCULAR | Status: AC | PRN
  Administered 2020-07-08: 100 mL via INTRAVENOUS

## 2020-07-08 MED ORDER — LOSARTAN POTASSIUM 50 MG PO TABS
ORAL_TABLET | 0 refills | Status: AC
Start: 2020-07-08 — End: ?

## 2020-07-15 ENCOUNTER — Other Ambulatory Visit: Payer: Self-pay | Admitting: Family Medicine

## 2020-07-16 NOTE — Telephone Encounter (Signed)
Ok to refill??  Last office visit 05/21/2020.  Last refill on Flexeril 04/09/2020.  Last refill on Ativan 04/19/2020.  Last refill on Lyrica 04/10/2020.

## 2020-07-24 MED ORDER — ATORVASTATIN CALCIUM 80 MG PO TABS
ORAL_TABLET | 3 refills | Status: AC
Start: 2020-07-24 — End: ?

## 2020-07-24 MED ORDER — HYDROCHLOROTHIAZIDE 25 MG PO TABS
ORAL_TABLET | 3 refills | Status: AC
Start: 2020-07-24 — End: ?

## 2020-07-24 MED ORDER — METFORMIN HCL ER 500 MG PO TB24
ORAL_TABLET | 3 refills | Status: AC
Start: 2020-07-24 — End: ?

## 2020-07-30 ENCOUNTER — Encounter: Payer: Self-pay | Admitting: Neurology

## 2020-07-30 ENCOUNTER — Other Ambulatory Visit: Payer: Self-pay

## 2020-07-30 ENCOUNTER — Encounter: Payer: Self-pay | Admitting: Family Medicine

## 2020-07-30 ENCOUNTER — Ambulatory Visit (INDEPENDENT_AMBULATORY_CARE_PROVIDER_SITE_OTHER): Payer: No Typology Code available for payment source | Admitting: Family Medicine

## 2020-07-30 VITALS — BP 132/82 | HR 72 | Temp 98.2°F | Resp 16 | Ht 72.0 in | Wt 248.0 lb

## 2020-07-30 DIAGNOSIS — M545 Low back pain, unspecified: Secondary | ICD-10-CM

## 2020-07-30 DIAGNOSIS — Z125 Encounter for screening for malignant neoplasm of prostate: Secondary | ICD-10-CM | POA: Diagnosis not present

## 2020-07-30 DIAGNOSIS — Z9889 Other specified postprocedural states: Secondary | ICD-10-CM

## 2020-07-30 DIAGNOSIS — G8929 Other chronic pain: Secondary | ICD-10-CM

## 2020-07-30 DIAGNOSIS — F411 Generalized anxiety disorder: Secondary | ICD-10-CM | POA: Diagnosis not present

## 2020-07-30 DIAGNOSIS — Z720 Tobacco use: Secondary | ICD-10-CM

## 2020-07-30 DIAGNOSIS — Z0000 Encounter for general adult medical examination without abnormal findings: Secondary | ICD-10-CM

## 2020-07-30 DIAGNOSIS — E7800 Pure hypercholesterolemia, unspecified: Secondary | ICD-10-CM | POA: Diagnosis not present

## 2020-07-30 DIAGNOSIS — F5101 Primary insomnia: Secondary | ICD-10-CM | POA: Diagnosis not present

## 2020-07-30 DIAGNOSIS — Z0001 Encounter for general adult medical examination with abnormal findings: Secondary | ICD-10-CM

## 2020-07-30 MED ORDER — ZOLPIDEM TARTRATE 10 MG PO TABS
10.0000 mg | ORAL_TABLET | Freq: Every day | ORAL | 3 refills | Status: DC
Start: 2020-07-30 — End: 2021-02-04
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
## 2020-07-31 LAB — LIPID PANEL
Cholesterol: 224 mg/dL — ABNORMAL HIGH (ref <200)
HDL: 41 mg/dL (ref >=40)
LDL Cholesterol (Calc): 141 mg/dL (calc) — ABNORMAL HIGH
Non-HDL Cholesterol (Calc): 183 mg/dL (calc) — ABNORMAL HIGH (ref <130)
Total CHOL/HDL Ratio: 5.5 (calc) — ABNORMAL HIGH (ref <5.0)
Triglycerides: 273 mg/dL — ABNORMAL HIGH (ref <150)

## 2020-07-31 LAB — PSA: PSA: 0.27 ng/mL (ref <=4.00)

## 2020-08-06 MED ORDER — LOSARTAN POTASSIUM 50 MG PO TABS
ORAL_TABLET | 0 refills | Status: AC
Start: 2020-08-06 — End: ?

## 2020-08-26 ENCOUNTER — Ambulatory Visit: Payer: BLUE CROSS/BLUE SHIELD

## 2020-09-07 ENCOUNTER — Telehealth: Payer: BLUE CROSS/BLUE SHIELD

## 2020-09-07 ENCOUNTER — Ambulatory Visit: Payer: BLUE CROSS/BLUE SHIELD

## 2020-09-07 DIAGNOSIS — I10 Essential (primary) hypertension: Secondary | ICD-10-CM

## 2020-09-07 DIAGNOSIS — E119 Type 2 diabetes mellitus without complications: Secondary | ICD-10-CM

## 2020-09-07 LAB — Albumin/Creatinine Ratio,Urine: CREATININE,RANDOM URINE: 129.7 mg/dL (ref <30.0)

## 2020-09-07 LAB — Lipid Panel: TRIGLYCERIDES: 71 mg/dL (ref >40–<150)

## 2020-09-07 LAB — Comprehensive Metabolic Panel
ANION GAP: 13 mmol/L (ref 8–19)
POTASSIUM: 3.9 mmol/L (ref 3.6–5.3)

## 2020-09-07 NOTE — Telephone Encounter
Pt came in for labs that was scheduled by [facility]. Per patient he was told to come in to do labs from the [facility] a few days ago for an upcoming appt with Dr.Ram. Orders in the system are expired. Patient is upset that he was misinformed and he does not have any more medication for his diabetes. Pt was offered an appt for today.

## 2020-09-07 NOTE — Telephone Encounter
Patient came in

## 2020-09-07 NOTE — Progress Notes
Name: Keith Cabrera  Medical Record Number: [id number]  Date of Service: 09/07/2020    Chief Complaint:   Chief Complaint   Patient presents with    Follow-up     Patient states he here to have blood work done.       History of Present Illness: Keith Cabrera is a 60 y.o. male who presents for blood work on multiple medical issues:  1. DMII; stable on current meds, will check levels.  2. HL: stable, cont current meds, will check FLP  3. HTN: stable on current meds    Past Medical History:   Diagnosis Date    Coronary artery disease     Diabetes mellitus, type 2 (HCC/RAF)     Hyperlipidemia     Hypertension        Review of Systems:   Gen - no acute distress  CV - no chest pain, no palpitations  Skin - no rash    Physical Exam:   Vitals:   Last Recorded Vital Signs:    09/07/20 0905   BP: 124/89   Pulse: 67   Resp: 16   Temp: (!) 35.6 C (96.1 F)   SpO2: 98%        General: No acute distress,  HEENT: Normocephalic, atraumatic, PERRL, conjunctiva/corneas clear, EOM's intact bilaterally  Lungs: Clear to auscultation bilaterally, respirations unlabored, no rales/rhonchi/wheezing  Heart: Regular rate and rhythm, S1, S2 normal, no murmurs, rubs or gallops  Neurologic: No focal neurologic deficits    Assessment and Plan:   The patient is a 60 y.o. year old male with:    Diagnoses and all orders for this visit:    Type 2 diabetes mellitus without complication, without long-term current use of insulin (HCC/RAF)  -stable, cont current meds  -     Albumin/Creat Ratio Ur; Future  -     Hgb A1c; Future    Mixed hyperlipidemia  -stable, cont current meds   -     Lipid Panel; Future    Essential hypertension  -stable, cont current meds   -     Comprehensive Metabolic Panel; Future      Marcelene Butte, MD  [facility]  [facility]

## 2020-09-10 ENCOUNTER — Ambulatory Visit: Payer: BLUE CROSS/BLUE SHIELD

## 2020-09-10 DIAGNOSIS — E1169 Type 2 diabetes mellitus with other specified complication: Secondary | ICD-10-CM

## 2020-09-10 DIAGNOSIS — E785 Hyperlipidemia, unspecified: Secondary | ICD-10-CM

## 2020-09-10 DIAGNOSIS — I2510 Atherosclerotic heart disease of native coronary artery without angina pectoris: Secondary | ICD-10-CM

## 2020-09-10 DIAGNOSIS — Z0100 Encounter for examination of eyes and vision without abnormal findings: Secondary | ICD-10-CM

## 2020-09-10 DIAGNOSIS — Z1159 Encounter for screening for other viral diseases: Secondary | ICD-10-CM

## 2020-09-10 DIAGNOSIS — E119 Type 2 diabetes mellitus without complications: Secondary | ICD-10-CM

## 2020-09-10 DIAGNOSIS — E1159 Type 2 diabetes mellitus with other circulatory complications: Secondary | ICD-10-CM

## 2020-09-10 DIAGNOSIS — I152 Hypertension secondary to endocrine disorders: Secondary | ICD-10-CM

## 2020-09-10 DIAGNOSIS — Z114 Encounter for screening for human immunodeficiency virus [HIV]: Secondary | ICD-10-CM

## 2020-09-10 LAB — Hgb A1c: HGB A1C - HPLC: 7 — ABNORMAL HIGH (ref <5.7)

## 2020-09-10 NOTE — Progress Notes
PROGRESS NOTE    Patient:  Keith Cabrera    Medical record number:  [id number]    Date of birth:  1961-01-16  Date of service:  09/10/2020      Chief complaint:    Chief Complaint   Patient presents with   ? Follow-up       History of present illness:           Keith Cabrera is a 60 y.o. male          Who presents with the following history of present illness:    He restarted taking jardiance   He has started to change his diet more   He has been more aware of his diet recently       Past medical history:           Patient Active Problem List   Diagnosis   ? Type 2 diabetes mellitus without complication (HCC/RAF)   ? History of coronary artery stent placement   ? Coronary artery disease involving native coronary artery without angina pectoris   ? Other and unspecified hyperlipidemia   ? Essential hypertension   ? Mixed hyperlipidemia   ? Ingrown toenail   ? Skin lesions   ? History of colonoscopy with polypectomy   ? Smokes cigars            Past Medical History:   Diagnosis Date   ? Coronary artery disease    ? Diabetes mellitus, type 2 (HCC/RAF)    ? Hyperlipidemia    ? Hypertension      Past surgical history:         Past Surgical History:   Procedure Laterality Date   ? COLONOSCOPY N/A 02/21/2016   ? CORONARY STENT PLACEMENT  03/13/13       Medications:         Medications that the patient states to be currently taking   Medication Sig   ? aspirin 81 mg EC tablet Take 81 mg by mouth daily.   ? ATORVASTATIN 80 mg tablet TAKE 1 TABLET BY MOUTH DAILY   ? ezetimibe 10 mg tablet TAKE 1 TABLET BY MOUTH DAILY   ? HYDROCHLOROTHIAZIDE 25 mg tablet TAKE 1 TABLET BY MOUTH DAILY   ? JARDIANCE 25 MG tablet TAKE 1 TABLET(25 MG) BY MOUTH EVERY MORNING   ? LOSARTAN 50 mg tablet TAKE 1 TABLET(50 MG) BY MOUTH DAILY   ? metFORMIN (FORTAMET) 500 mg PO ER 24 hr tablet Take 500 mg by mouth.   ? metFORMIN (GLUCOPHAGE XR) 500 mg PO ER 24 hr tablet TAKE 2 TABLETS BY MOUTH TWICE DAILY. FOLLOW UP   ? Omega-3 Fatty Acids (FISH OIL) 1000 mg capsule Take 1 g by mouth daily.   ? ONETOUCH DELICA FINE lancets USE 1 LANCET TWICE A DAY   ? ONETOUCH ULTRA BLUE test strip USE 1 STRIP TWICE A DAY   ? vitamin E 100 unit capsule Take 100 Units by mouth daily.       Allergy:       No Known Allergies    Family history:         Family History   Problem Relation Age of Onset   ? Alzheimer's disease Mother    ? Heart disease Father        Social history:         Social History     Tobacco Use   ? Smoking status: Former Smoker   ? Smokeless  tobacco: Never Used   ? Tobacco comment: occ. cigar   Substance Use Topics   ? Alcohol use: Not on file                     PE:         BP 116/68  ~ Pulse 59  ~ Temp 36.6 ?C (97.8 ?F) (Tympanic)  ~ Resp 14  ~ Ht 5' 11'' (1.803 m)  ~ Wt 214 lb (97.1 kg)  ~ BMI 29.85 kg/m?           Constitutional:    Appearance:   Well nourished, well developed, in no acute distress  Respiratory:  Clear to auscultation, non labored breathing  Cardiovascular:  Regular rate and rhythm, no murmer, gallop or rub  Psychiatric:   Mood normal, affect appropriate    Assessment/ Plan:    1. Type 2 diabetes mellitus without complication, without long-term current use of insulin (HCC/RAF)  - will continue Metformin and Jardiacne for now   - A1c has improved but not at goal yet  - we discussed continuing dietary modification   - CBC & Auto Differential; Future  - Hgb A1c; Future    2. Hyperlipidemia associated with type 2 diabetes mellitus (HCC/RAF)  - controlled with statin   - Lipid Panel; Future    3. Hypertension associated with diabetes (HCC/RAF)  - controlled   - will continue lisinopril     4. Coronary artery disease involving native coronary artery of native heart without angina pectoris  - Comprehensive Metabolic Panel; Future  -  Cardiology    5. Diabetic eye exam (HCC/RAF)  - [facility] Referral to Ophthalmology    6. Need for hepatitis B screening test  - HBS Antigen; Future    7. Need for hepatitis C screening test  - HCV Antibody Screen with Reflex to Quantitative PCR/Genotype; Future    8. Screening for HIV (human immunodeficiency virus)  - HIV-1/2 Ag/Ab 4th Generation with Reflex Confirmation; Future       The above recommendation were discussed with the patient.  The patient has all questions answered satisfactorily and is in agreement with this recommended plan of care.    Author:       Erling Cruz, MD  10:13 AM

## 2020-09-29 ENCOUNTER — Other Ambulatory Visit: Payer: Self-pay | Admitting: Family Medicine

## 2020-09-30 NOTE — Telephone Encounter (Signed)
Ok to refill 

## 2020-10-22 ENCOUNTER — Other Ambulatory Visit: Payer: Self-pay

## 2020-10-23 ENCOUNTER — Other Ambulatory Visit: Payer: Self-pay | Admitting: *Deleted

## 2020-10-23 ENCOUNTER — Other Ambulatory Visit: Payer: Self-pay | Admitting: Family Medicine

## 2020-10-23 NOTE — Telephone Encounter (Signed)
Received fax requesting refill on Lyrica.  Ok to refill??  Last office visit 07/30/2020.  Last refill 07/16/2020, #2 refills.

## 2020-10-24 ENCOUNTER — Other Ambulatory Visit: Payer: Self-pay | Admitting: Family Medicine

## 2020-10-24 MED ORDER — PREGABALIN 150 MG PO CAPS
ORAL_CAPSULE | ORAL | 2 refills | Status: DC
Start: 2020-10-24 — End: 2020-10-24

## 2020-10-24 MED ORDER — PREGABALIN 150 MG PO CAPS
ORAL_CAPSULE | ORAL | 2 refills | Status: DC
Start: 2020-10-24 — End: 2021-01-24

## 2020-10-24 NOTE — Addendum Note (Signed)
Addended by: Amalia Hailey on: 10/24/2020 08:18 AM   Modules accepted: Orders

## 2020-10-24 NOTE — Telephone Encounter (Signed)
Sent to provider 

## 2020-10-30 ENCOUNTER — Ambulatory Visit: Payer: BLUE CROSS/BLUE SHIELD

## 2020-11-20 ENCOUNTER — Other Ambulatory Visit: Payer: Self-pay

## 2020-11-20 ENCOUNTER — Telehealth: Payer: Self-pay

## 2020-11-20 ENCOUNTER — Ambulatory Visit (INDEPENDENT_AMBULATORY_CARE_PROVIDER_SITE_OTHER): Payer: No Typology Code available for payment source | Admitting: Nurse Practitioner

## 2020-11-20 ENCOUNTER — Encounter: Payer: Self-pay | Admitting: Nurse Practitioner

## 2020-11-20 VITALS — BP 138/68 | HR 88 | Temp 99.1°F | Resp 14 | Ht 72.0 in | Wt 248.0 lb

## 2020-11-20 DIAGNOSIS — M79642 Pain in left hand: Secondary | ICD-10-CM | POA: Diagnosis not present

## 2020-11-20 DIAGNOSIS — M79641 Pain in right hand: Secondary | ICD-10-CM

## 2020-11-20 NOTE — Progress Notes (Signed)
Subjective:    Patient ID: Derek [name], male    DOB: September 10, 1960, 60 y.o.   MRN: [id number]  HPI: Derek [name] is a 60 y.o. male presenting for hand pain.  Chief Complaint  Patient presents with   Arthralgia    X1 year- stiffness in joints and B hands will go numb   HAND PAIN Reports he sees Dr. Patrice Paradise for his neck and has been having pain in his hands getting worse.  Thought it was coming from his neck but is now getting worse.  Has had carpal tunnel surgery years ago.  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  Duration: months Involved hand: bilateral Mechanism of injury: arthritis Location: hands and down to wrists Onset: sudden, constant Severity: moderate to severe Quality: sharp, shooting Frequency: constant, worse when grabbing Radiation: no Aggravating factors: driving and grabbing steering wheel, mowing Alleviating factors: nothing  Treatments attempted: topical NSAID, Tylenol Relief with NSAIDs?: cannot take Weakness: no Numbness: yes Redness: no Swelling:no Bruising: no Fevers: no  Allergies  Allergen Reactions   Cymbalta [Duloxetine Hcl]     Change in moodmental     Outpatient Encounter Medications as of 11/20/2020  Medication Sig   buPROPion (WELLBUTRIN XL) 150 MG 24 hr tablet TAKE 1 TABLET(150 MG) BY MOUTH DAILY   butalbital-acetaminophen-caffeine (FIORICET) 50-325-40 MG tablet Take 1 tablet by mouth every 6 (six) hours as needed for headache.   cyclobenzaprine (FLEXERIL) 5 MG tablet TAKE 1 TABLET BY MOUTH THREE TIMES DAILY AS NEEDED FOR MUSCLE SPASMS   ezetimibe (ZETIA) 10 MG tablet Take 1 tablet (10 mg total) by mouth daily.   famotidine (PEPCID) 20 MG tablet Take 1 tablet (20 mg total) by mouth 2 (two) times daily.   fluticasone (FLONASE) 50 MCG/ACT nasal spray Place 2 sprays into both nostrils daily.   HYDROcodone-acetaminophen (NORCO) 10-325 MG tablet Take 1 tablet by mouth every 6 (six) hours as needed.   linaclotide  (LINZESS) 145 MCG CAPS capsule TAKE 1 CAPSULE BY MOUTH ONCE DAILY ON AN EMPTY STOMACH   loratadine (CLARITIN) 10 MG tablet Take 1 tablet (10 mg total) by mouth daily. OTC   LORazepam (ATIVAN) 0.5 MG tablet TAKE 1 TABLET(0.5 MG) BY MOUTH TWICE DAILY AS NEEDED FOR ANXIETY   Multiple Vitamin (MULTIVITAMIN) tablet Take 1 tablet by mouth daily.   pantoprazole (PROTONIX) 40 MG tablet Take 1 tablet (40 mg total) by mouth 2 (two) times daily.   pregabalin (LYRICA) 150 MG capsule TAKE 1 CAPSULE(150 MG) BY MOUTH TWICE DAILY   zolpidem (AMBIEN) 10 MG tablet Take 1 tablet (10 mg total) by mouth at bedtime.   No facility-administered encounter medications on file as of 11/20/2020.    Patient Active Problem List   Diagnosis Date Noted   Chews tobacco 04/19/2020   GAD (generalized anxiety disorder) 04/19/2020   Chronic back pain 07/17/2019   Migraines 07/17/2019   Low testosterone 10/19/2017   Status post spinal surgery 02/04/2016   Claustrophobia 06/04/2015   Diverticulosis of large intestine without perforation or abscess without bleeding 06/04/2015   Thyroid cyst 05/17/2015   History of colonic polyps    Diverticulosis of colon without hemorrhage    Obesity 07/18/2013   Right knee pain 03/18/2013   Insomnia 01/04/2013   Benign prostatic hyperplasia 09/05/2012   Sleep apnea 09/05/2012   Degenerative disc disease, lumbar 04/20/2012   Hypertriglyceridemia 04/20/2012   GERD (gastroesophageal reflux disease) 04/20/2012   Other specified complications of  surgical and medical care, not elsewhere classified, initial encounter 04/20/2012   Constipation 03/17/2011    Past Medical History:  Diagnosis Date   Arthritis    Back problem    BPH (benign prostatic hypertrophy)    Cystadenoma    of the pancreas s/p segmental resection of the pancreas by Dr. Romona Curls   Diverticulosis    ED (erectile dysfunction)    Family history of colonic polyps 03/17/2011   Brother, age 38    Gallstone pancreatitis     GERD (gastroesophageal reflux disease)    H. pylori infection 8/98   treated   Hypercholesteremia    Hyperlipidemia    Pancreatic disease    Sleep apnea    Solitary kidney, congenital    abscent right kidney    Relevant past medical, surgical, family and social history reviewed and updated as indicated. Interim medical history since our last visit reviewed.  Review of Systems Per HPI unless specifically indicated above     Objective:    BP 138/68   Pulse 88   Temp 99.1 F (37.3 C) (Temporal)   Resp 14   Ht 6' (1.829 m)   Wt 248 lb (112.5 kg)   SpO2 97%   BMI 33.63 kg/m   Wt Readings from Last 3 Encounters:  11/20/20 248 lb (112.5 kg)  07/30/20 248 lb (112.5 kg)  06/05/20 247 lb (112 kg)    Physical Exam Vitals and nursing note reviewed.  Constitutional:      General: He is not in acute distress.    Appearance: Normal appearance. He is not toxic-appearing.  Musculoskeletal:        General: Normal range of motion.     Right wrist: Bony tenderness present. No swelling. Normal range of motion. Normal pulse.     Left wrist: Bony tenderness present. No swelling. Normal range of motion. Normal pulse.     Right hand: Bony tenderness present. No swelling or deformity. Normal range of motion. Normal strength. Normal sensation. Normal capillary refill. Normal pulse.     Left hand: Bony tenderness present. No swelling or deformity. Normal range of motion. Normal strength. Normal sensation. Normal capillary refill. Normal pulse.  Skin:    General: Skin is warm and dry.     Capillary Refill: Capillary refill takes less than 2 seconds.     Coloration: Skin is not jaundiced or pale.     Findings: No erythema.  Neurological:     Mental Status: He is alert and oriented to person, place, and time.     Motor: No weakness.     Gait: Gait normal.  Psychiatric:        Mood and Affect: Mood normal.        Behavior: Behavior normal.        Thought Content: Thought content normal.         Judgment: Judgment normal.      Assessment & Plan:  1. Bilateral hand pain Acute on chronic.  I do not appreciate abnormality on examination of hands today besides tenderness to palpation, suspect possible arthritis.  Encouraged regular use of Tylenol, rest, heat/ice, and can try compression gloves or bracing.  Patient desires referral to previous hand surgeon who performed carpal tunnel surgery to discuss if this may be related.  Referral placed.  - Ambulatory referral to Hand Surgery    Follow up plan: Return if symptoms worsen or fail to improve.

## 2020-11-26 DIAGNOSIS — M79642 Pain in left hand: Secondary | ICD-10-CM | POA: Diagnosis not present

## 2020-11-26 DIAGNOSIS — M79641 Pain in right hand: Secondary | ICD-10-CM | POA: Diagnosis not present

## 2020-12-07 ENCOUNTER — Ambulatory Visit: Payer: BLUE CROSS/BLUE SHIELD

## 2020-12-08 ENCOUNTER — Institutional Professional Consult (permissible substitution): Payer: BLUE CROSS/BLUE SHIELD

## 2020-12-08 DIAGNOSIS — E119 Type 2 diabetes mellitus without complications: Secondary | ICD-10-CM

## 2020-12-08 DIAGNOSIS — I2510 Atherosclerotic heart disease of native coronary artery without angina pectoris: Secondary | ICD-10-CM

## 2020-12-08 DIAGNOSIS — Z114 Encounter for screening for human immunodeficiency virus [HIV]: Secondary | ICD-10-CM

## 2020-12-08 DIAGNOSIS — E785 Hyperlipidemia, unspecified: Secondary | ICD-10-CM

## 2020-12-08 DIAGNOSIS — Z1159 Encounter for screening for other viral diseases: Secondary | ICD-10-CM

## 2020-12-08 DIAGNOSIS — E1169 Type 2 diabetes mellitus with other specified complication: Secondary | ICD-10-CM

## 2020-12-09 LAB — Comprehensive Metabolic Panel
SODIUM: 137 mmol/L (ref 135–146)
TOTAL PROTEIN: 7.3 g/dL (ref 6.1–8.2)
UREA NITROGEN: 18 mg/dL (ref 7–22)

## 2020-12-09 LAB — HBs Ag: HEPATITIS B SURFACE ANTIGEN: NONREACTIVE

## 2020-12-09 LAB — HCV Ab Screen: HCV ANTIBODY SCREEN: NONREACTIVE

## 2020-12-09 LAB — CBC: NUCLEATED RBC%, AUTOMATED: 0 (ref 9.3–13.0)

## 2020-12-09 LAB — Differential Automated: ABSOLUTE IMMATURE GRAN COUNT: 0.01 10*3/uL (ref 0.00–0.04)

## 2020-12-09 LAB — Lipid Panel: NON-HDL,CHOLESTEROL,CALC: 56 mg/dL (ref >40–<130)

## 2020-12-10 LAB — HIV-1/2 Ag/Ab 4th Generation with Reflex Confirmation: HIV-1/2 AG/AB 4TH GENERATION WITH REFLEX CONFIRMATION: NONREACTIVE

## 2020-12-10 LAB — Hgb A1c: HGB A1C - HPLC: 7.3 — ABNORMAL HIGH (ref <5.7)

## 2020-12-11 ENCOUNTER — Ambulatory Visit: Payer: BLUE CROSS/BLUE SHIELD

## 2020-12-11 DIAGNOSIS — E1165 Type 2 diabetes mellitus with hyperglycemia: Secondary | ICD-10-CM

## 2020-12-11 DIAGNOSIS — Z23 Encounter for immunization: Secondary | ICD-10-CM

## 2020-12-11 MED ORDER — LOSARTAN POTASSIUM 50 MG PO TABS
50 mg | ORAL_TABLET | Freq: Every day | ORAL | 3 refills | Status: AC
Start: 2020-12-11 — End: ?

## 2020-12-11 MED ORDER — METFORMIN HCL ER 500 MG PO TB24
1000 mg | ORAL_TABLET | Freq: Two times a day (BID) | ORAL | 3 refills | Status: AC
Start: 2020-12-11 — End: ?

## 2020-12-11 MED ORDER — HYDROCHLOROTHIAZIDE 25 MG PO TABS
25 mg | ORAL_TABLET | Freq: Every day | ORAL | 3 refills | Status: AC
Start: 2020-12-11 — End: ?

## 2020-12-11 MED ORDER — JARDIANCE 25 MG PO TABS
25 mg | ORAL_TABLET | Freq: Every morning | ORAL | 3 refills | Status: AC
Start: 2020-12-11 — End: ?

## 2020-12-11 MED ORDER — ATORVASTATIN CALCIUM 80 MG PO TABS
80 mg | ORAL_TABLET | Freq: Every evening | ORAL | 3 refills | Status: AC
Start: 2020-12-11 — End: ?

## 2020-12-11 NOTE — Progress Notes
PROGRESS NOTE    Patient:  Keith Cabrera    Medical record number:  [id number]    Date of birth:  24-Oct-1960  Date of service:  12/11/2020      Chief complaint:    Chief Complaint   Patient presents with   ? Results       History of present illness:           Keith Cabrera is a 60 y.o. male          Who presents with the following history of present illness:    He is here for blood test results   He missed taking Jardiance for about 6 weeks   He hasn't been adherent with his diet     Past medical history:           Patient Active Problem List   Diagnosis   ? Type 2 diabetes mellitus without complication (HCC/RAF)   ? History of coronary artery stent placement   ? Coronary artery disease involving native coronary artery without angina pectoris   ? Other and unspecified hyperlipidemia   ? Hypertension associated with diabetes (HCC/RAF)   ? Mixed hyperlipidemia   ? Ingrown toenail   ? Skin lesions   ? History of colonoscopy with polypectomy   ? Smokes cigars   ? Hyperlipidemia associated with type 2 diabetes mellitus (HCC/RAF)            Past Medical History:   Diagnosis Date   ? Coronary artery disease    ? Diabetes mellitus, type 2 (HCC/RAF)    ? Hyperlipidemia    ? Hypertension      Past surgical history:         Past Surgical History:   Procedure Laterality Date   ? COLONOSCOPY N/A 02/21/2016   ? CORONARY STENT PLACEMENT  03/13/13       Medications:         Medications that the patient states to be currently taking   Medication Sig   ? aspirin 81 mg EC tablet Take 81 mg by mouth daily.   ? ATORVASTATIN 80 mg tablet TAKE 1 TABLET BY MOUTH DAILY   ? ezetimibe 10 mg tablet TAKE 1 TABLET BY MOUTH DAILY   ? HYDROCHLOROTHIAZIDE 25 mg tablet TAKE 1 TABLET BY MOUTH DAILY   ? JARDIANCE 25 MG tablet TAKE 1 TABLET(25 MG) BY MOUTH EVERY MORNING   ? LOSARTAN 50 mg tablet TAKE 1 TABLET(50 MG) BY MOUTH DAILY   ? metFORMIN (FORTAMET) 500 mg PO ER 24 hr tablet Take 500 mg by mouth.   ? metFORMIN (GLUCOPHAGE XR) 500 mg PO ER 24 hr tablet TAKE 2 TABLETS BY MOUTH TWICE DAILY. FOLLOW UP   ? Omega-3 Fatty Acids (FISH OIL) 1000 mg capsule Take 1 g by mouth daily.   ? ONETOUCH DELICA FINE lancets USE 1 LANCET TWICE A DAY   ? ONETOUCH ULTRA BLUE test strip USE 1 STRIP TWICE A DAY   ? vitamin E 100 unit capsule Take 100 Units by mouth daily.       Allergy:       No Known Allergies    Family history:         Family History   Problem Relation Age of Onset   ? Alzheimer's disease Mother    ? Heart disease Father        Social history:         Social History  Tobacco Use   ? Smoking status: Former Smoker   ? Smokeless tobacco: Never Used   ? Tobacco comment: occ. cigar   Substance Use Topics   ? Alcohol use: Not on file                     PE:         BP 125/71  ~ Pulse 77  ~ Temp 36.9 ?C (98.5 ?F) (Tympanic)  ~ Resp 14  ~ Ht 5' 11'' (1.803 m)  ~ Wt 218 lb (98.9 kg)  ~ BMI 30.40 kg/m?           Constitutional:    Appearance:   Well nourished, well developed, in no acute distress  Respiratory:  Clear to auscultation, non labored breathing  Cardiovascular:  Regular rate and rhythm, no murmer, gallop or rub  Psychiatric:   Mood normal, affect appropriate    Assessment/ Plan:    1. Uncontrolled type 2 diabetes mellitus with hyperglycemia (HCC/RAF)  - reviewed his medication list and his dietary intake   - I offered to change medication and start Trulicity  - pt prefers to improve his diet first and he will be compliant with jardiance   - will recheck A1c in 3 months   - metFORMIN (GLUCOPHAGE XR) 500 mg PO ER 24 hr tablet; Take 2 tablets (1,000 mg total) by mouth two (2) times daily.  Dispense: 360 tablet; Refill: 3  - empagliflozin (JARDIANCE) 25 mg tablet; Take 1 tablet (25 mg total) by mouth every morning.  Dispense: 90 tablet; Refill: 3  - Hgb A1c; Future    2. Hypertension associated with diabetes (HCC/RAF)  - stable   - losartan 50 mg tablet; Take 1 tablet (50 mg total) by mouth daily.  Dispense: 90 tablet; Refill: 3  - hydroCHLOROthiazide 25 mg tablet; Take 1 tablet (25 mg total) by mouth daily.  Dispense: 90 tablet; Refill: 3  - Comprehensive Metabolic Panel; Future    3. Hyperlipidemia associated with type 2 diabetes mellitus (HCC/RAF)  - controlled   - atorvastatin 80 mg tablet; Take 1 tablet (80 mg total) by mouth at bedtime.  Dispense: 90 tablet; Refill: 3  - Lipid Panel; Future       The above recommendation were discussed with the patient.  The patient has all questions answered satisfactorily and is in agreement with this recommended plan of care.    Author:       Erling Cruz, MD  10:48 AM

## 2020-12-19 ENCOUNTER — Other Ambulatory Visit: Payer: Self-pay | Admitting: Nurse Practitioner

## 2020-12-19 NOTE — Telephone Encounter (Signed)
Ok to refill 

## 2020-12-27 ENCOUNTER — Other Ambulatory Visit: Payer: Self-pay | Admitting: Neurology

## 2021-01-17 ENCOUNTER — Other Ambulatory Visit: Payer: Self-pay | Admitting: Family Medicine

## 2021-01-24 ENCOUNTER — Other Ambulatory Visit: Payer: Self-pay | Admitting: Family Medicine

## 2021-01-24 NOTE — Telephone Encounter (Signed)
Ok to refill??  Last office visit  11/20/2020.  Last refill 10/24/2020, #2 refills.

## 2021-01-29 ENCOUNTER — Ambulatory Visit: Payer: Medicare Other

## 2021-01-31 ENCOUNTER — Other Ambulatory Visit: Payer: Self-pay

## 2021-01-31 ENCOUNTER — Other Ambulatory Visit: Payer: Medicare Other

## 2021-01-31 ENCOUNTER — Ambulatory Visit: Payer: Medicare Other | Admitting: Family Medicine

## 2021-01-31 DIAGNOSIS — Z136 Encounter for screening for cardiovascular disorders: Secondary | ICD-10-CM | POA: Diagnosis not present

## 2021-01-31 DIAGNOSIS — Z13220 Encounter for screening for lipoid disorders: Secondary | ICD-10-CM | POA: Diagnosis not present

## 2021-01-31 DIAGNOSIS — E781 Pure hyperglyceridemia: Secondary | ICD-10-CM | POA: Diagnosis not present

## 2021-01-31 DIAGNOSIS — E7800 Pure hypercholesterolemia, unspecified: Secondary | ICD-10-CM | POA: Diagnosis not present

## 2021-01-31 LAB — COMPLETE METABOLIC PANEL WITH GFR
AG Ratio: 2.3 (calc) (ref 1.0–2.5)
ALT: 26 U/L (ref 9–46)
AST: 20 U/L (ref 10–35)
Albumin: 4.5 g/dL (ref 3.6–5.1)
Alkaline phosphatase (APISO): 79 U/L (ref 35–144)
BUN: 13 mg/dL (ref 7–25)
CO2: 28 mmol/L (ref 20–32)
Calcium: 9.3 mg/dL (ref 8.6–10.3)
Chloride: 108 mmol/L (ref 98–110)
Creat: 1.17 mg/dL (ref 0.70–1.35)
Globulin: 2 g/dL (calc) (ref 1.9–3.7)
Glucose, Bld: 87 mg/dL (ref 65–99)
Potassium: 4 mmol/L (ref 3.5–5.3)
Sodium: 143 mmol/L (ref 135–146)
Total Bilirubin: 0.3 mg/dL (ref 0.2–1.2)
Total Protein: 6.5 g/dL (ref 6.1–8.1)
eGFR: 71 mL/min/{1.73_m2} (ref >=60)

## 2021-01-31 LAB — LIPID PANEL
Cholesterol: 196 mg/dL (ref <200)
HDL: 38 mg/dL — ABNORMAL LOW (ref >=40)
LDL Cholesterol (Calc): 117 mg/dL (calc) — ABNORMAL HIGH
Non-HDL Cholesterol (Calc): 158 mg/dL (calc) — ABNORMAL HIGH (ref <130)
Total CHOL/HDL Ratio: 5.2 (calc) — ABNORMAL HIGH (ref <5.0)
Triglycerides: 308 mg/dL — ABNORMAL HIGH (ref <150)

## 2021-01-31 LAB — CBC WITH DIFFERENTIAL/PLATELET
Absolute Monocytes: 599 cells/uL (ref 200–950)
Basophils Absolute: 40 cells/uL (ref 0–200)
Basophils Relative: 0.7 %
Eosinophils Absolute: 160 cells/uL (ref 15–500)
Eosinophils Relative: 2.8 %
HCT: 46.5 % (ref 38.5–50.0)
Hemoglobin: 15.2 g/dL (ref 13.2–17.1)
Lymphs Abs: 2160 cells/uL (ref 850–3900)
MCH: 30.8 pg (ref 27.0–33.0)
MCHC: 32.7 g/dL (ref 32.0–36.0)
MCV: 94.1 fL (ref 80.0–100.0)
MPV: 9.7 fL (ref 7.5–12.5)
Monocytes Relative: 10.5 %
Neutro Abs: 2742 cells/uL (ref 1500–7800)
Neutrophils Relative %: 48.1 %
Platelets: 237 10*3/uL (ref 140–400)
RBC: 4.94 10*6/uL (ref 4.20–5.80)
RDW: 12.3 % (ref 11.0–15.0)
Total Lymphocyte: 37.9 %
WBC: 5.7 10*3/uL (ref 3.8–10.8)

## 2021-02-03 ENCOUNTER — Other Ambulatory Visit: Payer: Self-pay

## 2021-02-03 ENCOUNTER — Ambulatory Visit (INDEPENDENT_AMBULATORY_CARE_PROVIDER_SITE_OTHER): Payer: No Typology Code available for payment source | Admitting: Family Medicine

## 2021-02-03 ENCOUNTER — Encounter: Payer: Self-pay | Admitting: Family Medicine

## 2021-02-03 ENCOUNTER — Other Ambulatory Visit: Payer: Self-pay | Admitting: Family Medicine

## 2021-02-03 VITALS — BP 134/72 | HR 94 | Temp 97.9°F | Resp 16 | Ht 72.0 in | Wt 248.0 lb

## 2021-02-03 DIAGNOSIS — Z23 Encounter for immunization: Secondary | ICD-10-CM | POA: Diagnosis not present

## 2021-02-03 DIAGNOSIS — E7800 Pure hypercholesterolemia, unspecified: Secondary | ICD-10-CM

## 2021-02-03 MED ORDER — ROSUVASTATIN CALCIUM 5 MG PO TABS: 5.0000 mg | ORAL_TABLET | Freq: Every day | ORAL | 3 refills | Status: AC

## 2021-02-03 NOTE — Telephone Encounter (Signed)
Ok to refill??  Last office visit 02/03/2021.  Last refill 07/30/2020, #3 refills.
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
## 2021-02-05 ENCOUNTER — Other Ambulatory Visit: Payer: Self-pay | Admitting: Family Medicine

## 2021-02-12 DIAGNOSIS — M130 Polyarthritis, unspecified: Secondary | ICD-10-CM | POA: Diagnosis not present

## 2021-02-13 ENCOUNTER — Ambulatory Visit (INDEPENDENT_AMBULATORY_CARE_PROVIDER_SITE_OTHER): Payer: No Typology Code available for payment source | Admitting: Family Medicine

## 2021-02-13 ENCOUNTER — Encounter: Payer: Self-pay | Admitting: Family Medicine

## 2021-02-13 ENCOUNTER — Other Ambulatory Visit: Payer: Self-pay

## 2021-02-13 VITALS — BP 122/82 | HR 64 | Temp 98.3°F | Resp 18 | Ht 72.0 in | Wt 246.0 lb

## 2021-02-13 DIAGNOSIS — M25561 Pain in right knee: Secondary | ICD-10-CM
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
## 2021-02-19 ENCOUNTER — Encounter: Payer: Self-pay | Admitting: Family Medicine

## 2021-02-19 DIAGNOSIS — M25561 Pain in right knee: Secondary | ICD-10-CM

## 2021-02-19 NOTE — Telephone Encounter (Signed)
Please advise, thanks.

## 2021-02-24 DIAGNOSIS — M25561 Pain in right knee: Secondary | ICD-10-CM | POA: Diagnosis not present

## 2021-03-01 ENCOUNTER — Other Ambulatory Visit: Payer: Self-pay | Admitting: Family Medicine

## 2021-03-04 ENCOUNTER — Other Ambulatory Visit: Payer: Self-pay | Admitting: *Deleted

## 2021-03-04 MED ORDER — EZETIMIBE 10 MG PO TABS: 10.0000 mg | ORAL_TABLET | Freq: Every day | ORAL | 3 refills | Status: DC

## 2021-03-06 ENCOUNTER — Ambulatory Visit: Payer: BLUE CROSS/BLUE SHIELD

## 2021-03-08 NOTE — Progress Notes (Signed)
Chart reviewed, agree above plan ?

## 2021-03-11 DIAGNOSIS — M25461 Effusion, right knee: Secondary | ICD-10-CM | POA: Diagnosis not present

## 2021-03-11 DIAGNOSIS — M67961 Unspecified disorder of synovium and tendon, right lower leg: Secondary | ICD-10-CM | POA: Diagnosis not present

## 2021-03-11 DIAGNOSIS — S83281A Other tear of lateral meniscus, current injury, right knee, initial encounter: Secondary | ICD-10-CM | POA: Diagnosis not present

## 2021-03-11 DIAGNOSIS — S83241A Other tear of medial meniscus, current injury, right knee, initial encounter: Secondary | ICD-10-CM | POA: Diagnosis not present

## 2021-03-11 DIAGNOSIS — M65861 Other synovitis and tenosynovitis, right lower leg: Secondary | ICD-10-CM | POA: Diagnosis not present

## 2021-03-11 DIAGNOSIS — M1711 Unilateral primary osteoarthritis, right knee: Secondary | ICD-10-CM | POA: Diagnosis not present

## 2021-03-11 DIAGNOSIS — M7121 Synovial cyst of popliteal space [Baker], right knee: Secondary | ICD-10-CM | POA: Diagnosis not present

## 2021-03-12 ENCOUNTER — Ambulatory Visit: Payer: BLUE CROSS/BLUE SHIELD

## 2021-03-13 DIAGNOSIS — M25561 Pain in right knee: Secondary | ICD-10-CM | POA: Diagnosis not present

## 2021-03-31 ENCOUNTER — Telehealth: Payer: BLUE CROSS/BLUE SHIELD

## 2021-03-31 DIAGNOSIS — E1165 Type 2 diabetes mellitus with hyperglycemia: Secondary | ICD-10-CM

## 2021-03-31 MED ORDER — JARDIANCE 25 MG PO TABS
25 mg | ORAL_TABLET | Freq: Every morning | ORAL | 0 refills | Status: AC
Start: 2021-03-31 — End: ?

## 2021-03-31 NOTE — Telephone Encounter
Done

## 2021-03-31 NOTE — Telephone Encounter
Patient has an appointment in February he would like a refill on his medication Jadiance 25mg  please advise thank you     [phone] thank you

## 2021-04-01 NOTE — Telephone Encounter
Called patient, no answer, lvm to return my call.

## 2021-04-03 ENCOUNTER — Other Ambulatory Visit: Payer: Self-pay | Admitting: Nurse Practitioner

## 2021-04-03 DIAGNOSIS — J309 Allergic rhinitis, unspecified: Secondary | ICD-10-CM

## 2021-04-06 HISTORY — PX: KNEE SURGERY: SHX244

## 2021-04-10 ENCOUNTER — Telehealth: Payer: Self-pay | Admitting: Family Medicine

## 2021-04-10 ENCOUNTER — Ambulatory Visit (INDEPENDENT_AMBULATORY_CARE_PROVIDER_SITE_OTHER): Payer: No Typology Code available for payment source | Admitting: Family Medicine

## 2021-04-10 ENCOUNTER — Encounter: Payer: Self-pay | Admitting: Family Medicine

## 2021-04-10 ENCOUNTER — Other Ambulatory Visit: Payer: Self-pay

## 2021-04-10 VITALS — BP 120/72 | HR 88 | Temp 97.9°F | Ht 72.0 in | Wt 245.0 lb

## 2021-04-10 DIAGNOSIS — J069 Acute upper respiratory infection, unspecified: Secondary | ICD-10-CM | POA: Diagnosis not present

## 2021-04-10 MED ORDER — AMOXICILLIN 875 MG PO TABS: 875.0000 mg | ORAL_TABLET | Freq: Two times a day (BID) | ORAL | 0 refills | Status: AC

## 2021-04-10 NOTE — Telephone Encounter (Signed)
Patient disputing bill from [facility] 11/20/20. Requesting for bill to be resubmitted to insurance. [place]alling is primary and Medicare is secondary.   Please advise at [phone].

## 2021-04-10 NOTE — Progress Notes (Signed)
Subjective:    Patient ID: Derek [name], male    DOB: May 24, 1960, 61 y.o.   MRN: [id number]  HPI  Patient states that he has been sick for over a week.  Symptoms include nasal congestion, sinus pressure in his maxillary and frontal sinuses, rhinorrhea, postnasal drip, and a nonproductive.  He denies any fevers or chills.  He denies any myalgias but he does report fatigue.  He took a home COVID test that was negative.  He denies any shortness of breath or chest pain or pleurisy.  He has surgery scheduled in 6 days to repair right meniscal tear. Past Medical History:  Diagnosis Date   Arthritis    Back problem    BPH (benign prostatic hypertrophy)    Cystadenoma    of the pancreas s/p segmental resection of the pancreas by Dr. Romona Curls   Diverticulosis    ED (erectile dysfunction)    Family history of colonic polyps 03/17/2011   Brother, age 48    Gallstone pancreatitis    GERD (gastroesophageal reflux disease)    H. pylori infection 8/98   treated   Hypercholesteremia    Hyperlipidemia    Pancreatic disease    Sleep apnea    Solitary kidney, congenital    abscent right kidney   .psh Current Outpatient Medications on File Prior to Visit  Medication Sig Dispense Refill   buPROPion (WELLBUTRIN XL) 150 MG 24 hr tablet TAKE 1 TABLET(150 MG) BY MOUTH DAILY 30 tablet 2   butalbital-acetaminophen-caffeine (FIORICET) 50-325-40 MG tablet TAKE 1 TABLET BY MOUTH EVERY 6 HOURS AS NEEDED FOR HEADACHE 12 tablet 3   cyclobenzaprine (FLEXERIL) 5 MG tablet TAKE 1 TABLET BY MOUTH THREE TIMES DAILY AS NEEDED FOR MUSCLE SPASMS 60 tablet 2   ezetimibe (ZETIA) 10 MG tablet Take 1 tablet (10 mg total) by mouth daily. 90 tablet 3   fluticasone (FLONASE) 50 MCG/ACT nasal spray Place 2 sprays into both nostrils daily. 16 g 6   HYDROcodone-acetaminophen (NORCO) 10-325 MG tablet Take 1 tablet by mouth every 6 (six) hours as needed.     linaclotide (LINZESS) 145 MCG CAPS capsule TAKE 1 CAPSULE BY MOUTH  ONCE DAILY ON AN EMPTY STOMACH 90 capsule 3   loratadine (CLARITIN) 10 MG tablet TAKE 1 TABLET(10 MG) BY MOUTH DAILY 90 tablet 0   LORazepam (ATIVAN) 0.5 MG tablet TAKE 1 TABLET(0.5 MG) BY MOUTH TWICE DAILY AS NEEDED FOR ANXIETY 45 tablet 2   magnesium oxide (MAG-OX) 400 MG tablet Take 400 mg by mouth daily.     Multiple Vitamin (MULTIVITAMIN) tablet Take 1 tablet by mouth daily.     pantoprazole (PROTONIX) 40 MG tablet Take 1 tablet (40 mg total) by mouth 2 (two) times daily. 180 tablet 2   pregabalin (LYRICA) 150 MG capsule TAKE 1 CAPSULE(150 MG) BY MOUTH TWICE DAILY 60 capsule 2   rosuvastatin (CRESTOR) 5 MG tablet Take 1 tablet (5 mg total) by mouth daily. 90 tablet 3   topiramate (TOPAMAX) 100 MG tablet Take 100 mg by mouth daily.     zolpidem (AMBIEN) 10 MG tablet TAKE 1 TABLET(10 MG) BY MOUTH AT BEDTIME 30 tablet 3   No current facility-administered medications on file prior to visit.   Allergies  Allergen Reactions   Cymbalta [Duloxetine Hcl]     Change in moodmental    Social History   Socioeconomic History   Marital status: Married    Spouse name: Mandy Fitzwater   Number of children: 2  Years of education: 67   Highest education level: High school graduate  Occupational History   Occupation: disability    Comment: back     Employer: [employer]  Tobacco Use   Smoking status: Former    Packs/day: 2.00    Years: 30.00    Pack years: 60.00    Types: Cigarettes    Quit date: 09/15/2007    Years since quitting: 13.5   Smokeless tobacco: Current    Types: Snuff  Substance and Sexual Activity   Alcohol use: Yes    Comment: 18 pack of beer each week   Drug use: No   Sexual activity: Yes  Other Topics Concern   Not on file  Social History Narrative   Lives at home with wife.   Right-handed.   1-2 cups caffeine per day.   Social Determinants of Health   Financial Resource Strain: Not on file  Food Insecurity: Not on file  Transportation Needs: Not on file   Physical Activity: Not on file  Stress: Not on file  Social Connections: Not on file  Intimate Partner Violence: Not on file     Review of Systems  All other systems reviewed and are negative.     Objective:   Physical Exam Constitutional:      General: He is not in acute distress.    Appearance: Normal appearance. He is obese. He is not ill-appearing or toxic-appearing.  HENT:     Head: Normocephalic and atraumatic.     Right Ear: Tympanic membrane and ear canal normal.     Left Ear: Tympanic membrane and ear canal normal.     Nose: Congestion and rhinorrhea present.     Right Sinus: No maxillary sinus tenderness or frontal sinus tenderness.     Left Sinus: No maxillary sinus tenderness or frontal sinus tenderness.     Mouth/Throat:     Pharynx: No oropharyngeal exudate or posterior oropharyngeal erythema.  Cardiovascular:     Rate and Rhythm: Normal rate and regular rhythm.     Heart sounds: Normal heart sounds.  Pulmonary:     Effort: Pulmonary effort is normal.     Breath sounds: Normal breath sounds.  Neurological:     Mental Status: He is alert.          Assessment & Plan:  Viral upper respiratory tract infection Patient appears to be suffering from a viral uri.  I suspect RSV.  Recommended Sudafed 30 to 60 mg every 6 hours as needed for head congestion and tincture of time.  Anticipate symptoms will be better on Monday.  If symptoms worsen he can start amoxicillin 875 mg twice daily for 10 days for secondary bacterial sinus infection if symptoms persist longer than 10 days and if he develops sinus pain.

## 2021-04-16 DIAGNOSIS — S83231A Complex tear of medial meniscus, current injury, right knee, initial encounter: Secondary | ICD-10-CM | POA: Diagnosis not present

## 2021-04-16 DIAGNOSIS — S83281A Other tear of lateral meniscus, current injury, right knee, initial encounter: Secondary | ICD-10-CM | POA: Diagnosis not present

## 2021-04-16 DIAGNOSIS — S83241A Other tear of medial meniscus, current injury, right knee, initial encounter: Secondary | ICD-10-CM | POA: Diagnosis not present

## 2021-04-16 DIAGNOSIS — M1711 Unilateral primary osteoarthritis, right knee: Secondary | ICD-10-CM | POA: Diagnosis not present

## 2021-04-16 DIAGNOSIS — S83271A Complex tear of lateral meniscus, current injury, right knee, initial encounter: Secondary | ICD-10-CM | POA: Diagnosis not present

## 2021-04-16 DIAGNOSIS — G8918 Other acute postprocedural pain: Secondary | ICD-10-CM | POA: Diagnosis not present

## 2021-04-21 DIAGNOSIS — M6281 Muscle weakness (generalized): Secondary | ICD-10-CM | POA: Diagnosis not present

## 2021-04-21 DIAGNOSIS — M25461 Effusion, right knee: Secondary | ICD-10-CM | POA: Diagnosis not present

## 2021-04-21 DIAGNOSIS — S83271D Complex tear of lateral meniscus, current injury, right knee, subsequent encounter: Secondary | ICD-10-CM | POA: Diagnosis not present

## 2021-04-21 DIAGNOSIS — M25661 Stiffness of right knee, not elsewhere classified: Secondary | ICD-10-CM | POA: Diagnosis not present

## 2021-04-21 DIAGNOSIS — R262 Difficulty in walking, not elsewhere classified: Secondary | ICD-10-CM | POA: Diagnosis not present

## 2021-04-24 DIAGNOSIS — R262 Difficulty in walking, not elsewhere classified: Secondary | ICD-10-CM | POA: Diagnosis not present

## 2021-04-24 DIAGNOSIS — M25461 Effusion, right knee: Secondary | ICD-10-CM | POA: Diagnosis not present

## 2021-04-24 DIAGNOSIS — M6281 Muscle weakness (generalized): Secondary | ICD-10-CM | POA: Diagnosis not present

## 2021-04-24 DIAGNOSIS — S83271D Complex tear of lateral meniscus, current injury, right knee, subsequent encounter: Secondary | ICD-10-CM | POA: Diagnosis not present

## 2021-04-24 DIAGNOSIS — M25661 Stiffness of right knee, not elsewhere classified: Secondary | ICD-10-CM | POA: Diagnosis not present

## 2021-04-30 ENCOUNTER — Other Ambulatory Visit: Payer: Self-pay | Admitting: Family Medicine

## 2021-04-30 NOTE — Telephone Encounter (Signed)
Lyrica refill request, flexeril refill request.  Last seen 04/10/2021, last filled 03/03/2021

## 2021-05-03 ENCOUNTER — Other Ambulatory Visit: Payer: Self-pay | Admitting: Family Medicine

## 2021-05-03 DIAGNOSIS — J309 Allergic rhinitis, unspecified: Secondary | ICD-10-CM

## 2021-05-08 NOTE — Telephone Encounter (Signed)
Sending message to billing team.
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
## 2021-05-15 ENCOUNTER — Ambulatory Visit (INDEPENDENT_AMBULATORY_CARE_PROVIDER_SITE_OTHER): Payer: No Typology Code available for payment source | Admitting: Neurology

## 2021-05-15 ENCOUNTER — Encounter: Payer: Self-pay | Admitting: Neurology

## 2021-05-15 VITALS — BP 143/102 | HR 76 | Ht 72.0 in | Wt 250.5 lb

## 2021-05-15 DIAGNOSIS — G43709 Chronic migraine without aura, not intractable, without status migrainosus: Secondary | ICD-10-CM | POA: Diagnosis not present

## 2021-05-15 DIAGNOSIS — G4733 Obstructive sleep apnea (adult) (pediatric): Secondary | ICD-10-CM | POA: Diagnosis not present

## 2021-05-15 NOTE — Telephone Encounter (Signed)
Erroneous encounter. Please disregard.

## 2021-05-15 NOTE — Patient Instructions (Signed)
Make sure using CPAP every night > 4 hours Cut back Topamax to 50 mg daily for several weeks, if you have no return of headache stop it  Can continue Fioricet as needed for significant headache Discuss the mood with Dr. Dennard Schaumann  I will pull a CPAP download in 6 weeks to make sure usage has improved See you back in 1 year

## 2021-05-15 NOTE — Progress Notes (Signed)
CPAP orders sent to [facility].

## 2021-05-16 ENCOUNTER — Ambulatory Visit (INDEPENDENT_AMBULATORY_CARE_PROVIDER_SITE_OTHER): Payer: No Typology Code available for payment source | Admitting: Family Medicine

## 2021-05-16 ENCOUNTER — Other Ambulatory Visit: Payer: Self-pay

## 2021-05-16 ENCOUNTER — Encounter: Payer: Self-pay | Admitting: Family Medicine

## 2021-05-16 VITALS — BP 124/98 | HR 91 | Temp 97.9°F | Resp 18 | Ht 72.0 in | Wt 250.0 lb

## 2021-05-16 DIAGNOSIS — F321 Major depressive disorder, single episode, moderate: Secondary | ICD-10-CM

## 2021-05-16 MED ORDER — ESCITALOPRAM OXALATE 10 MG PO TABS: 10.0000 mg | ORAL_TABLET | Freq: Every day | ORAL | 11 refills | Status: DC

## 2021-05-16 MED ORDER — HYDROCODONE-ACETAMINOPHEN 5-325 MG PO TABS: 1.0000 | ORAL_TABLET | ORAL | 0 refills | Status: DC | PRN

## 2021-05-16 MED ORDER — LINACLOTIDE 145 MCG PO CAPS: ORAL_CAPSULE | ORAL | 3 refills | Status: DC
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
## 2021-05-21 ENCOUNTER — Ambulatory Visit: Payer: BLUE CROSS/BLUE SHIELD

## 2021-05-31 ENCOUNTER — Other Ambulatory Visit: Payer: Self-pay | Admitting: Family Medicine

## 2021-06-02 ENCOUNTER — Encounter: Payer: Self-pay | Admitting: Family Medicine

## 2021-06-05 ENCOUNTER — Other Ambulatory Visit: Payer: Self-pay | Admitting: Family Medicine

## 2021-06-05 MED ORDER — VENLAFAXINE HCL ER 75 MG PO CP24: 75.0000 mg | ORAL_CAPSULE | Freq: Every day | ORAL | 3 refills | Status: DC

## 2021-06-05 NOTE — Telephone Encounter (Signed)
Patient thought he was to start Effexor but no rx has been sent in yet. ? ?Please advise, thanks! ?

## 2021-06-10 MED ORDER — PANTOPRAZOLE SODIUM 40 MG PO TBEC: 40.0000 mg | DELAYED_RELEASE_TABLET | Freq: Every day | ORAL | 3 refills | Status: DC

## 2021-06-11 ENCOUNTER — Telehealth: Payer: Self-pay

## 2021-06-11 ENCOUNTER — Ambulatory Visit: Payer: BLUE CROSS/BLUE SHIELD

## 2021-06-11 DIAGNOSIS — E119 Type 2 diabetes mellitus without complications: Secondary | ICD-10-CM

## 2021-06-11 DIAGNOSIS — Z23 Encounter for immunization: Secondary | ICD-10-CM

## 2021-06-11 DIAGNOSIS — I2510 Atherosclerotic heart disease of native coronary artery without angina pectoris: Secondary | ICD-10-CM

## 2021-06-11 MED ORDER — JARDIANCE 25 MG PO TABS
25 mg | ORAL_TABLET | Freq: Every morning | ORAL | 0 refills | Status: AC
Start: 2021-06-11 — End: ?

## 2021-06-11 NOTE — Progress Notes
PROGRESS NOTE    Patient:  Keith Cabrera    Medical record number:  [id number]    Date of birth:  March 17, 1961  Date of service:  06/11/2021      Chief complaint:    Chief Complaint   Patient presents with   ? Follow-up     Uncontrolled type 2 diabetes mellitus with hyperglycemia        History of present illness:           Keith Cabrera is a 61 y.o. male          Who presents with the following history of present illness:    He is here for DM f/u   He has been cutting back on his alcohol to weekends only       Past medical history:           Patient Active Problem List   Diagnosis   ? Type 2 diabetes mellitus without complication (HCC/RAF)   ? History of coronary artery stent placement   ? Coronary artery disease involving native coronary artery without angina pectoris   ? Hypertension associated with diabetes (HCC/RAF)   ? Mixed hyperlipidemia   ? Ingrown toenail   ? History of colonoscopy with polypectomy   ? Smokes cigars   ? Hyperlipidemia associated with type 2 diabetes mellitus (HCC/RAF)            Past Medical History:   Diagnosis Date   ? Coronary artery disease    ? Diabetes mellitus, type 2 (HCC/RAF)    ? Hyperlipidemia    ? Hypertension      Past surgical history:         Past Surgical History:   Procedure Laterality Date   ? COLONOSCOPY N/A 02/21/2016   ? CORONARY STENT PLACEMENT  03/13/13       Medications:         No outpatient medications have been marked as taking for the 06/11/21 encounter (Office Visit) with Erling Cruz., MD.       Allergy:       No Known Allergies    Family history:         Family History   Problem Relation Age of Onset   ? Alzheimer's disease Mother    ? Heart disease Father        Social history:         Social History     Tobacco Use   ? Smoking status: Former   ? Smokeless tobacco: Never   ? Tobacco comments:     occ. cigar   Substance Use Topics   ? Alcohol use: Not on file                     PE:         BP 127/72  ~ Pulse 62  ~ Temp 36.3 ?C (97.3 ?F) (Tympanic)  ~ Resp 15  ~ Ht 5' 11'' (1.803 m)  ~ Wt 211 lb (95.7 kg)  ~ BMI 29.43 kg/m?           Constitutional:    Appearance:   Well nourished, well developed, in no acute distress  Psychiatric:   Mood normal, affect appropriate    Assessment/ Plan:    1. Uncontrolled type 2 diabetes mellitus with hypoglycemia without coma (HCC/RAF)  - we had detail conversation  regarding his Diabetes and healthy diet   - advised to cut back on alcohol  and carbs  - I advsied we need to introduce Trulicity to his regimen  - he would like to wait another 3 months before going to injection   - Hgb A1c; Future  - Albumin/Creat Ratio Ur, CLINIC COLLECT TODAY; Future  - empagliflozin (JARDIANCE) 25 mg tablet; Take 1 tablet (25 mg total) by mouth every morning.  Dispense: 90 tablet; Refill: 0    2. Hypertension associated with diabetes (HCC/RAF)  - Comprehensive Metabolic Panel; Future    3. Hyperlipidemia associated with type 2 diabetes mellitus (HCC/RAF)  - Lipid Panel; Future    4. Coronary artery disease involving native coronary artery of native heart without angina pectoris  - he will be doing stress test with Dr. Work     5. Need for prophylactic vaccination against Streptococcus pneumoniae (pneumococcus)  - pneumococcal conjugate vaccine 20-valent (PREVNAR 20) - preferred vaccine for all individuals >65 or who meet one of the following conditions: asplenia, cochlear implants, CSF leaks or are immunocompromised.    The above recommendation were discussed with the patient.  The patient has all questions answered satisfactorily and is in agreement with this recommended plan of care.    Author:       Erling Cruz, MD  8:45 AM                 [phone] Established patient (30-39 minutes) I spent 32 minutes.  When time (rather than medical decision making) is used for determination of Level of Service for this encounter, the time includes the minutes that were spent personally be me today on this encounter which include today's pre-visit review of the chart, time spent during the visit, and today's time spent after the visit documenting and coordinating care. The time documented was exclusive of the time spent on any separately billed procedure.

## 2021-06-11 NOTE — Patient Instructions
[facility]    [place]  [address], [address], [place], [place]  [place], [place]  [address]  [place], [place] [address]  [phone] Phone  [phone] Fax    Open Monday to Friday, 8:00 am to 5:00 pm    [place]  [address], [place], [place] [address]  [place], [place]  [address]  [place], [place] [address]  [phone] Phone  [phone] Fax    Open Monday to Friday, 8:00 am to 5:00 pm      [facility]  [address], [address], [place], [place] [address]  [place], [place]  [address]  [place], [place] [address]  [phone] Phone  [phone] Fax    Open Monday to Friday, 8:00 am to 5:00 pm      [place]  [place]  [address], [place], [place]  [address], [address]  [place], [place] [address]  [phone] Phone  [phone] Fax    Open Monday to Friday, 8:00 am to 6:00 pm      [address]. [place], [place] area.  [address], [address]  [place], [place] [address]  [phone] Phone  [phone] Fax    Open Monday to Friday, 6:00 am to 7:00 pm  Open weekends and holidays, 7:00 am to 3:30 pm, except Thanksgiving Day, Christmas Day, New Year's Day        [place]  [facility] [address]: [address], [place], [place]  [facility] [place] [facility]  [address]  [place], [place] [address]  [phone] Phone  [phone] Fax    Open Monday to Friday, 8:00 am to 6:00 pm      [place]  [facility] ([facility]) in [place], [place]  [place], [place]  [address]  [place], [place] [address]  [phone] Phone  [phone] Fax    Open Monday to Friday, 8:00 am to 4:30 pm        [place]  [facility] ([facility]) in [place], [place]  [place], [place]  [address]  [place] [address]  [phone] Phone  [phone] Fax    Open Monday to Friday, 8:00 am to 5:00 pm

## 2021-06-11 NOTE — Telephone Encounter (Signed)
Per [facility] Pantoprazole is not on patient's formulary list.  ? ?Please advise of alternative, thanks! ?

## 2021-06-12 MED ORDER — OMEPRAZOLE 40 MG PO CPDR
40.0000 mg | DELAYED_RELEASE_CAPSULE | Freq: Every day | ORAL | 3 refills | Status: DC
Start: 2021-06-12 — End: 2022-06-16

## 2021-06-12 NOTE — Telephone Encounter (Signed)
Omeprazole sent to pharmacy.  ? ?[place] to advise patient ?

## 2021-06-12 NOTE — Telephone Encounter (Signed)
Patient spoke with [name] and made aware of new rx. Nothing further needed at this time.  ? ?

## 2021-06-18 ENCOUNTER — Telehealth: Payer: BLUE CROSS/BLUE SHIELD

## 2021-06-18 NOTE — Telephone Encounter
Keith Cabrera from  [facility] Manus Rudd Work office called requesting a referral . Patient is schedule for an echocardiogram on 03/21/20223 and they need a referral for patient. Phone [phone] fax [phone]

## 2021-06-23 DIAGNOSIS — M25461 Effusion, right knee: Secondary | ICD-10-CM | POA: Diagnosis not present

## 2021-06-26 NOTE — Telephone Encounter
Spoke to Molino at Dr.Work's office and advise her health plan does not require referral from PCP

## 2021-06-30 ENCOUNTER — Encounter: Payer: Self-pay | Admitting: Neurology

## 2021-07-11 ENCOUNTER — Other Ambulatory Visit: Payer: Self-pay | Admitting: Family Medicine

## 2021-07-14 NOTE — Telephone Encounter (Signed)
LOV 05/16/21 ?Last refill 02/04/21, #30, 3 refills ? ?Please review, thanks! ? ?

## 2021-07-15 ENCOUNTER — Other Ambulatory Visit: Payer: Self-pay | Admitting: Family Medicine

## 2021-07-22 DIAGNOSIS — S83281D Other tear of lateral meniscus, current injury, right knee, subsequent encounter: Secondary | ICD-10-CM | POA: Diagnosis not present

## 2021-07-22 DIAGNOSIS — S83241D Other tear of medial meniscus, current injury, right knee, subsequent encounter: Secondary | ICD-10-CM | POA: Diagnosis not present

## 2021-07-24 MED ORDER — ATORVASTATIN CALCIUM 80 MG PO TABS
ORAL_TABLET | 3 refills
Start: 2021-07-24 — End: ?

## 2021-07-24 MED ORDER — HYDROCHLOROTHIAZIDE 25 MG PO TABS
ORAL_TABLET | 3 refills
Start: 2021-07-24 — End: ?

## 2021-07-24 MED ORDER — METFORMIN HCL ER 500 MG PO TB24
ORAL_TABLET | 3 refills
Start: 2021-07-24 — End: ?

## 2021-07-25 MED ORDER — HYDROCHLOROTHIAZIDE 25 MG PO TABS
ORAL_TABLET | 3 refills | Status: AC
Start: 2021-07-25 — End: ?

## 2021-07-25 MED ORDER — METFORMIN HCL ER 500 MG PO TB24
ORAL_TABLET | 3 refills | Status: AC
Start: 2021-07-25 — End: ?

## 2021-07-25 MED ORDER — ATORVASTATIN CALCIUM 80 MG PO TABS
ORAL_TABLET | 3 refills | Status: AC
Start: 2021-07-25 — End: ?

## 2021-07-31 ENCOUNTER — Telehealth: Payer: Self-pay

## 2021-07-31 ENCOUNTER — Ambulatory Visit (INDEPENDENT_AMBULATORY_CARE_PROVIDER_SITE_OTHER): Payer: No Typology Code available for payment source

## 2021-07-31 ENCOUNTER — Other Ambulatory Visit: Payer: Self-pay | Admitting: Family Medicine

## 2021-07-31 VITALS — Ht 72.0 in | Wt 250.0 lb

## 2021-07-31 DIAGNOSIS — Z0000 Encounter for general adult medical examination without abnormal findings: Secondary | ICD-10-CM | POA: Diagnosis not present

## 2021-07-31 MED ORDER — HYDROCODONE-ACETAMINOPHEN 5-325 MG PO TABS: 1.0000 | ORAL_TABLET | Freq: Four times a day (QID) | ORAL | 0 refills | Status: DC | PRN
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
## 2021-07-31 NOTE — Patient Instructions (Signed)
Derek [name] , ?Thank you for taking time to come for your Medicare Wellness Visit. I appreciate your ongoing commitment to your health goals. Please review the following plan we discussed and let me know if I can assist you in the future.  ? ?Screening recommendations/referrals: ?Colonoscopy: Done 08/02/2014 Repeat in 10 years ? ?Recommended yearly ophthalmology/optometry visit for glaucoma screening and checkup ?Recommended yearly dental visit for hygiene and checkup ? ?Vaccinations: ?Influenza vaccine: Done 02/03/2021 Repeat annually ? ?Pneumococcal vaccine: Discussed. ?Tdap vaccine: Done 01/04/2013 Repeat in 10 years ? ?Shingles vaccine: Discussed.   ?Covid-19: Done 08/01/2019, 06/29/2019 ? ?Advanced directives: Please bring a copy of your health care power of attorney and living will to the office to be added to your chart at your convenience. ? ? ?Conditions/risks identified: Aim for 30 minutes of exercise or brisk walking, 6-8 glasses of water, and 5 servings of fruits and vegetables each day. ?KEEP UP THE GOOD WORK!! ? ?Next appointment: Follow up in one year for your annual wellness visit 08/06/2022 @ 9:30 AM.  ? ?Preventive Care 40-64 Years, Male ?Preventive care refers to lifestyle choices and visits with your health care provider that can promote health and wellness. ?What does preventive care include? ?A yearly physical exam. This is also called an annual well check. ?Dental exams once or twice a year. ?Routine eye exams. Ask your health care provider how often you should have your eyes checked. ?Personal lifestyle choices, including: ?Daily care of your teeth and gums. ?Regular physical activity. ?Eating a healthy diet. ?Avoiding tobacco and drug use. ?Limiting alcohol use. ?Practicing safe sex. ?Taking low-dose aspirin every day starting at age 84. ?What happens during an annual well check? ?The services and screenings done by your health care provider during your annual well check will depend on your age,  overall health, lifestyle risk factors, and family history of disease. ?Counseling  ?Your health care provider may ask you questions about your: ?Alcohol use. ?Tobacco use. ?Drug use. ?Emotional well-being. ?Home and relationship well-being. ?Sexual activity. ?Eating habits. ?Work and work Statistician. ?Screening  ?You may have the following tests or measurements: ?Height, weight, and BMI. ?Blood pressure. ?Lipid and cholesterol levels. These may be checked every 5 years, or more frequently if you are over 30 years old. ?Skin check. ?Lung cancer screening. You may have this screening every year starting at age 78 if you have a 30-pack-year history of smoking and currently smoke or have quit within the past 15 years. ?Fecal occult blood test (FOBT) of the stool. You may have this test every year starting at age 32. ?Flexible sigmoidoscopy or colonoscopy. You may have a sigmoidoscopy every 5 years or a colonoscopy every 10 years starting at age 34. ?Prostate cancer screening. Recommendations will vary depending on your family history and other risks. ?Hepatitis C blood test. ?Hepatitis B blood test. ?Sexually transmitted disease (STD) testing. ?Diabetes screening. This is done by checking your blood sugar (glucose) after you have not eaten for a while (fasting). You may have this done every 1-3 years. ?Discuss your test results, treatment options, and if necessary, the need for more tests with your health care provider. ?Vaccines  ?Your health care provider may recommend certain vaccines, such as: ?Influenza vaccine. This is recommended every year. ?Tetanus, diphtheria, and acellular pertussis (Tdap, Td) vaccine. You may need a Td booster every 10 years. ?Zoster vaccine. You may need this after age 72. ?Pneumococcal 13-valent conjugate (PCV13) vaccine. You may need this if you have certain conditions and  have not been vaccinated. ?Pneumococcal polysaccharide (PPSV23) vaccine. You may need one or two doses if you smoke  cigarettes or if you have certain conditions. ?Talk to your health care provider about which screenings and vaccines you need and how often you need them. ?This information is not intended to replace advice given to you by your health care provider. Make sure you discuss any questions you have with your health care provider. ?Document Released: 04/19/2015 Document Revised: 12/11/2015 Document Reviewed: 01/22/2015 ?Elsevier Interactive Patient Education ? 2017 [place]. ? ?Fall Prevention in the Home ?Falls can cause injuries. They can happen to people of all ages. There are many things you can do to make your home safe and to help prevent falls. ?What can I do on the outside of my home? ?Regularly fix the edges of walkways and driveways and fix any cracks. ?Remove anything that might make you trip as you walk through a door, such as a raised step or threshold. ?Trim any bushes or trees on the path to your home. ?Use bright outdoor lighting. ?Clear any walking paths of anything that might make someone trip, such as rocks or tools. ?Regularly check to see if handrails are loose or broken. Make sure that both sides of any steps have handrails. ?Any raised decks and porches should have guardrails on the edges. ?Have any leaves, snow, or ice cleared regularly. ?Use sand or salt on walking paths during winter. ?Clean up any spills in your garage right away. This includes oil or grease spills. ?What can I do in the bathroom? ?Use night lights. ?Install grab bars by the toilet and in the tub and shower. Do not use towel bars as grab bars. ?Use non-skid mats or decals in the tub or shower. ?If you need to sit down in the shower, use a plastic, non-slip stool. ?Keep the floor dry. Clean up any water that spills on the floor as soon as it happens. ?Remove soap buildup in the tub or shower regularly. ?Attach bath mats securely with double-sided non-slip rug tape. ?Do not have throw rugs and other things on the floor that  can make you trip. ?What can I do in the bedroom? ?Use night lights. ?Make sure that you have a light by your bed that is easy to reach. ?Do not use any sheets or blankets that are too big for your bed. They should not hang down onto the floor. ?Have a firm chair that has side arms. You can use this for support while you get dressed. ?Do not have throw rugs and other things on the floor that can make you trip. ?What can I do in the kitchen? ?Clean up any spills right away. ?Avoid walking on wet floors. ?Keep items that you use a lot in easy-to-reach places. ?If you need to reach something above you, use a strong step stool that has a grab bar. ?Keep electrical cords out of the way. ?Do not use floor polish or wax that makes floors slippery. If you must use wax, use non-skid floor wax. ?Do not have throw rugs and other things on the floor that can make you trip. ?What can I do with my stairs? ?Do not leave any items on the stairs. ?Make sure that there are handrails on both sides of the stairs and use them. Fix handrails that are broken or loose. Make sure that handrails are as long as the stairways. ?Check any carpeting to make sure that it is firmly attached to the stairs.  Fix any carpet that is loose or worn. ?Avoid having throw rugs at the top or bottom of the stairs. If you do have throw rugs, attach them to the floor with carpet tape. ?Make sure that you have a light switch at the top of the stairs and the bottom of the stairs. If you do not have them, ask someone to add them for you. ?What else can I do to help prevent falls? ?Wear shoes that: ?Do not have high heels. ?Have rubber bottoms. ?Are comfortable and fit you well. ?Are closed at the toe. Do not wear sandals. ?If you use a stepladder: ?Make sure that it is fully opened. Do not climb a closed stepladder. ?Make sure that both sides of the stepladder are locked into place. ?Ask someone to hold it for you, if possible. ?Clearly mark and make sure that you  can see: ?Any grab bars or handrails. ?First and last steps. ?Where the edge of each step is. ?Use tools that help you move around (mobility aids) if they are needed. These include: ?Canes. ?Walkers. ?[place]

## 2021-07-31 NOTE — Telephone Encounter (Signed)
In speaking with patient today, he states he is awaiting repeat knee surgery due to a second torn meniscus. Pt states that his Orthopedic MD wanted to change pt over to Tramadol from his Hydrocodone. Pt states he is taking the Tramadol but is just makes him sleepy. Pt asks if a refill on the Hydrocodone 5/325 could be called in for him to take as needed?  ?

## 2021-08-04 ENCOUNTER — Ambulatory Visit (INDEPENDENT_AMBULATORY_CARE_PROVIDER_SITE_OTHER): Payer: No Typology Code available for payment source | Admitting: Family Medicine

## 2021-08-04 VITALS — BP 130/90 | HR 74 | Temp 98.0°F | Ht 73.0 in | Wt 247.2 lb

## 2021-08-04 DIAGNOSIS — Z125 Encounter for screening for malignant neoplasm of prostate: Secondary | ICD-10-CM

## 2021-08-04 DIAGNOSIS — R350 Frequency of micturition: Secondary | ICD-10-CM

## 2021-08-04 DIAGNOSIS — E7800 Pure hypercholesterolemia, unspecified: Secondary | ICD-10-CM | POA: Diagnosis not present

## 2021-08-04 DIAGNOSIS — N401 Enlarged prostate with lower urinary tract symptoms: Secondary | ICD-10-CM

## 2021-08-04 MED ORDER — TRIAMCINOLONE ACETONIDE 0.1 % EX CREA: 1.0000 "application " | TOPICAL_CREAM | Freq: Two times a day (BID) | CUTANEOUS | 0 refills | Status: DC
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
## 2021-08-05 LAB — COMPLETE METABOLIC PANEL WITH GFR
AG Ratio: 2 (calc) (ref 1.0–2.5)
ALT: 37 U/L (ref 9–46)
AST: 28 U/L (ref 10–35)
Albumin: 4.6 g/dL (ref 3.6–5.1)
Alkaline phosphatase (APISO): 64 U/L (ref 35–144)
BUN: 15 mg/dL (ref 7–25)
CO2: 31 mmol/L (ref 20–32)
Calcium: 9.8 mg/dL (ref 8.6–10.3)
Chloride: 103 mmol/L (ref 98–110)
Creat: 0.97 mg/dL (ref 0.70–1.35)
Globulin: 2.3 g/dL (calc) (ref 1.9–3.7)
Glucose, Bld: 92 mg/dL (ref 65–99)
Potassium: 4.8 mmol/L (ref 3.5–5.3)
Sodium: 143 mmol/L (ref 135–146)
Total Bilirubin: 0.5 mg/dL (ref 0.2–1.2)
Total Protein: 6.9 g/dL (ref 6.1–8.1)
eGFR: 88 mL/min/{1.73_m2} (ref >=60)

## 2021-08-05 LAB — LIPID PANEL
Cholesterol: 174 mg/dL (ref <200)
HDL: 43 mg/dL (ref >=40)
LDL Cholesterol (Calc): 101 mg/dL (calc) — ABNORMAL HIGH
Non-HDL Cholesterol (Calc): 131 mg/dL (calc) — ABNORMAL HIGH (ref <130)
Total CHOL/HDL Ratio: 4 (calc) (ref <5.0)
Triglycerides: 182 mg/dL — ABNORMAL HIGH (ref <150)

## 2021-08-05 LAB — CBC WITH DIFFERENTIAL/PLATELET
Absolute Monocytes: 767 cells/uL (ref 200–950)
Basophils Absolute: 58 cells/uL (ref 0–200)
Basophils Relative: 0.8 %
Eosinophils Absolute: 117 cells/uL (ref 15–500)
Eosinophils Relative: 1.6 %
HCT: 46 % (ref 38.5–50.0)
Hemoglobin: 15.5 g/dL (ref 13.2–17.1)
Lymphs Abs: 2124 cells/uL (ref 850–3900)
MCH: 32 pg (ref 27.0–33.0)
MCHC: 33.7 g/dL (ref 32.0–36.0)
MCV: 95 fL (ref 80.0–100.0)
MPV: 10 fL (ref 7.5–12.5)
Monocytes Relative: 10.5 %
Neutro Abs: 4234 cells/uL (ref 1500–7800)
Neutrophils Relative %: 58 %
Platelets: 277 10*3/uL (ref 140–400)
RBC: 4.84 10*6/uL (ref 4.20–5.80)
RDW: 12.7 % (ref 11.0–15.0)
Total Lymphocyte: 29.1 %
WBC: 7.3 10*3/uL (ref 3.8–10.8)

## 2021-08-05 LAB — PSA: PSA: 0.21 ng/mL (ref <=4.00)

## 2021-08-12 DIAGNOSIS — S83281D Other tear of lateral meniscus, current injury, right knee, subsequent encounter: Secondary | ICD-10-CM | POA: Diagnosis not present

## 2021-08-12 DIAGNOSIS — S83241D Other tear of medial meniscus, current injury, right knee, subsequent encounter: Secondary | ICD-10-CM | POA: Diagnosis not present

## 2021-08-19 ENCOUNTER — Ambulatory Visit (HOSPITAL_COMMUNITY): Payer: No Typology Code available for payment source

## 2021-09-04 ENCOUNTER — Encounter: Payer: Self-pay | Admitting: Family Medicine

## 2021-09-08 ENCOUNTER — Encounter: Payer: Self-pay | Admitting: Family Medicine

## 2021-09-09 ENCOUNTER — Other Ambulatory Visit: Payer: Self-pay | Admitting: Family Medicine

## 2021-09-09 NOTE — Telephone Encounter (Signed)
Requested medication (s) are due for refill today -yes  Requested medication (s) are on the active medication list -yes  Future visit scheduled -no  Last refill: 05/01/21 #60 3RF  Notes to clinic: non delegated Rx  Requested Prescriptions  Pending Prescriptions Disp Refills   pregabalin (LYRICA) 150 MG capsule [Pharmacy Med Name: PREGABALIN '150MG'$  CAPSULES] 60 capsule     Sig: TAKE 1 CAPSULE(150 MG) BY MOUTH TWICE DAILY     Not Delegated - Neurology:  Anticonvulsants - Controlled - pregabalin Failed - 09/09/2021 11:43 AM      Failed - This refill cannot be delegated      Passed - Cr in normal range and within 360 days    Creat  Date Value Ref Range Status  08/04/2021 0.97 0.70 - 1.35 mg/dL Final         Passed - Completed PHQ-2 or PHQ-9 in the last 360 days      Passed - Valid encounter within last 12 months    Recent Outpatient Visits           1 month ago Pure hypercholesterolemia   [place], Cammie Mcgee, MD   3 months ago Current moderate episode of major depressive disorder without prior episode ([place])   [place] Pickard, Cammie Mcgee, MD   5 months ago Viral upper respiratory tract infection   [place] Susy Frizzle, MD   6 months ago Acute pain of right knee   [place] Dennard Schaumann, Cammie Mcgee, MD   7 months ago Need for immunization against influenza   [place] Pickard, Cammie Mcgee, MD                  Requested Prescriptions  Pending Prescriptions Disp Refills   pregabalin (LYRICA) 150 MG capsule [Pharmacy Med Name: PREGABALIN '150MG'$  CAPSULES] 60 capsule     Sig: TAKE 1 CAPSULE(150 MG) BY MOUTH TWICE DAILY     Not Delegated - Neurology:  Anticonvulsants - Controlled - pregabalin Failed - 09/09/2021 11:43 AM      Failed - This refill cannot be delegated      Passed - Cr in normal range and within 360 days    Creat  Date Value Ref Range Status  08/04/2021 0.97  0.70 - 1.35 mg/dL Final         Passed - Completed PHQ-2 or PHQ-9 in the last 360 days      Passed - Valid encounter within last 12 months    Recent Outpatient Visits           1 month ago Pure hypercholesterolemia   [place] Pickard, Cammie Mcgee, MD   3 months ago Current moderate episode of major depressive disorder without prior episode ([place])   [place] Pickard, Cammie Mcgee, MD   5 months ago Viral upper respiratory tract infection   [place], Warren T, MD   6 months ago Acute pain of right knee   [place] Pickard, Cammie Mcgee, MD   7 months ago Need for immunization against influenza   [place], Cammie Mcgee, MD

## 2021-09-11 ENCOUNTER — Ambulatory Visit: Payer: BLUE CROSS/BLUE SHIELD

## 2021-09-11 DIAGNOSIS — E11649 Type 2 diabetes mellitus with hypoglycemia without coma: Secondary | ICD-10-CM

## 2021-09-11 DIAGNOSIS — Z125 Encounter for screening for malignant neoplasm of prostate: Secondary | ICD-10-CM

## 2021-09-11 MED ORDER — TRULICITY 0.75 MG/0.5ML SC SOPN
0.75 mg | SUBCUTANEOUS | 3 refills | Status: AC
Start: 2021-09-11 — End: ?

## 2021-09-11 NOTE — Progress Notes
PROGRESS NOTE    Patient:  Keith Cabrera    Medical record number:  [id number]    Date of birth:  13-Sep-1960  Date of service:  09/11/2021      Chief complaint:    Chief Complaint   Patient presents with   ? Follow-up     Uncontrolled type 2 diabetes mellitus with hypoglycemia without coma        History of present illness:           Keith Cabrera is a 61 y.o. male          Who presents with the following history of present illness:    He is here for DM f/u     Past medical history:           Patient Active Problem List   Diagnosis   ? Type 2 diabetes mellitus without complication (HCC/RAF)   ? History of coronary artery stent placement   ? Coronary artery disease involving native coronary artery without angina pectoris   ? Hypertension associated with diabetes (HCC/RAF)   ? Mixed hyperlipidemia   ? Ingrown toenail   ? History of colonoscopy with polypectomy   ? Smokes cigars   ? Hyperlipidemia associated with type 2 diabetes mellitus (HCC/RAF)            Past Medical History:   Diagnosis Date   ? Coronary artery disease    ? Diabetes mellitus, type 2 (HCC/RAF)    ? Hyperlipidemia    ? Hypertension      Past surgical history:         Past Surgical History:   Procedure Laterality Date   ? COLONOSCOPY N/A 02/21/2016   ? CORONARY STENT PLACEMENT  03/13/13       Medications:         No outpatient medications have been marked as taking for the 09/11/21 encounter (Office Visit) with Erling Cruz., MD.       Allergy:       No Known Allergies    Family history:         Family History   Problem Relation Age of Onset   ? Alzheimer's disease Mother    ? Heart disease Father        Social history:         Social History     Tobacco Use   ? Smoking status: Former   ? Smokeless tobacco: Never   ? Tobacco comments:     occ. cigar   Substance Use Topics   ? Alcohol use: Not on file           Lab Results   Component Value Date    HCT 46.4 12/08/2020    HGB 15.5 12/08/2020    MCV 92.6 12/08/2020    PLT 236 12/08/2020    WBC 7.07 12/08/2020     Lab Results   Component Value Date    BUN 22 09/07/2021    CL 101 09/07/2021    CO2 23 09/07/2021    CREAT 0.93 09/07/2021    K 4.0 09/07/2021    NA 140 09/07/2021     Lab Results   Component Value Date    HGBA1C 7.8 (H) 09/07/2021     Lab Results   Component Value Date    ALKPHOS 57 09/07/2021    ALT 23 09/07/2021    AST 20 09/07/2021    BILITOT 0.7 09/07/2021     Lab Results  Component Value Date    TSH 1.9 08/28/2017     Lab Results   Component Value Date    CALCIUM 9.5 09/07/2021     Lab Results   Component Value Date    CHOL 148 09/07/2021    CHOLDLCAL 85 09/07/2021    CHOLHDL 44 09/07/2021    TRIGLY 94 09/07/2021                 PE:         BP 132/67  ~ Pulse 73  ~ Temp 36.5 ?C (97.7 ?F) (Tympanic)  ~ Resp 14  ~ Ht 5' 11'' (1.803 m)  ~ Wt 219 lb (99.3 kg)  ~ BMI 30.54 kg/m?           Constitutional:    Appearance:   Well nourished, well developed, in no acute distress  Psychiatric:   Mood normal, affect appropriate    Assessment/ Plan:    1. Uncontrolled type 2 diabetes mellitus with hypoglycemia without coma (HCC/RAF)  - not at goal  - start Trulicity  - discussed in detail dietary changes   -ofered to see dietician but he wants to change his diet on his own   - dulaglutide (TRULICITY) 0.75 mg/0.5 mL pen; Inject 0.5 mLs (0.75 mg total) under the skin every seven (7) days.  Dispense: 6 mL; Refill: 3  - Comprehensive Metabolic Panel; Future  - Hgb A1c; Future  - CBC & Auto Differential; Future  - TSH with reflex FT4, FT3; Future    2. Hypertension associated with diabetes (HCC/RAF)  - stable     3. Hyperlipidemia associated with type 2 diabetes mellitus (HCC/RAF)  - Lipid Panel; Future    4. Prostate cancer screening  - PSA,Total; Future       The above recommendation were discussed with the patient.  The patient has all questions answered satisfactorily and is in agreement with this recommended plan of care.    Author:       Erling Cruz, MD  8:47 AM

## 2021-09-11 NOTE — Patient Instructions
[facility]    [place]  [address], [address]  [place], [place] [address]  [phone] Phone  [phone] Fax  Open Monday to Friday, 8:00 am to 5:00 pm    [place]  [address]  [place], [place] [address]  [phone] Phone  [phone] Fax  Open Monday to Friday, 8:00 am to 5:00 pm      [place]  [address], [address]  , [place] [address]  [phone] Phone  [phone] Fax  Open Monday to Friday, 8:00 am to 5:00 pm      [place]  [address], [address]  [place], [place] [address]  [phone] Phone  [phone] Fax  Open Monday to Friday, 8:00 am to 6:00 pm      [address]. [place].  [address], [address]  [place], [place] [address]  [phone] Phone  [phone] Fax  Open Monday to Friday, 6:00 am to 7:00 pm  Open weekends and holidays, 7:00 am to 3:30 pm, except Thanksgiving Day, Christmas Day, New Year's Day      [place]  [facility]  [address], [address]  [place], [place] [address]  [phone] Phone  [phone] Fax  Open Monday to Friday, 8:00 am to 6:00 pm      [place]  [place] [place]  [address]  [place], [place] [address]  [phone] Phone  [phone] Fax  Open Monday to Friday, 8:00 am to 4:30 pm      [place]  [address], [address]  [place], [place] [address]  [phone] Phone  [phone] Fax  Open Monday to Friday, 8:00 am to 5:00 pm

## 2021-09-12 ENCOUNTER — Ambulatory Visit: Payer: Self-pay | Admitting: Physician Assistant

## 2021-09-12 DIAGNOSIS — G8929 Other chronic pain: Secondary | ICD-10-CM
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
## 2021-09-14 ENCOUNTER — Other Ambulatory Visit: Payer: Self-pay | Admitting: Family Medicine

## 2021-09-14 MED ORDER — AMLODIPINE BESYLATE 5 MG PO TABS: 5.0000 mg | ORAL_TABLET | Freq: Every day | ORAL | 1 refills | Status: DC

## 2021-09-15 ENCOUNTER — Other Ambulatory Visit (HOSPITAL_COMMUNITY): Payer: Medicare Other

## 2021-09-15 ENCOUNTER — Other Ambulatory Visit: Payer: Self-pay

## 2021-09-15 MED ORDER — AMLODIPINE BESYLATE 5 MG PO TABS: 5.0000 mg | ORAL_TABLET | Freq: Every day | ORAL | 0 refills | Status: DC

## 2021-09-18 NOTE — Progress Notes (Addendum)
Anesthesia Review:  PCP: Jenna Luo LOV 08/04/21  Cardiologist : Neuro- Idamae Lusher, NP LVO 05/15/21  Chest x-ray : EKG : Echo : Stress test: Cardiac Cath :  Activity level:  Sleep Study/ CPAP : Fasting Blood Sugar :      / Checks Blood Sugar -- times a day:   Blood Thinner/ Instructions /Last Dose: ASA / Instructions/ Last Dose :

## 2021-09-18 NOTE — Progress Notes (Signed)
DUE TO COVID-19 ONLY  2  VISITOR IS ALLOWED TO COME WITH YOU AND STAY IN THE WAITING ROOM ONLY DURING PRE OP AND PROCEDURE DAY OF SURGERY.   4 VISITOR  MAY VISIT WITH YOU AFTER SURGERY IN YOUR PRIVATE ROOM DURING VISITING HOURS ONLY! YOU MAY HAVE ONE PERSON SPEND THE NITE WITH YOU IN YOUR ROOM AFTER SURGERY.    ING.    Your procedure is scheduled on:                  09/26/21   Report to [facility] Main  Entrance   Report to admitting at    0515             AM DO NOT [place], PICTURE ID OR WALLET DAY OF SURGERY.      Call this number if you have problems the morning of surgery [phone]    REMEMBER: NO  SOLID FOODS , CANDY, GUM OR MINTS AFTER [place] .       [name] Kitchen CLEAR LIQUIDS UNTIL       0430am          DAY OF SURGERY.      PLEASE FINISH ENSURE DRINK PER SURGEON ORDER  WHICH NEEDS TO BE COMPLETED AT     0430am     MORNING OF SURGERY.       CLEAR LIQUID DIET   Foods Allowed      WATER BLACK COFFEE ( SUGAR OK, NO MILK, CREAM OR CREAMER) REGULAR AND DECAF  TEA ( SUGAR OK NO MILK, CREAM, OR CREAMER) REGULAR AND DECAF  PLAIN JELLO ( NO RED)  FRUIT ICES ( NO RED, NO FRUIT PULP)  POPSICLES ( NO RED)  JUICE- APPLE, WHITE GRAPE AND WHITE CRANBERRY  SPORT DRINK LIKE GATORADE ( NO RED)  CLEAR BROTH ( VEGETABLE , CHICKEN OR BEEF)                                                                     BRUSH YOUR TEETH MORNING OF SURGERY AND RINSE YOUR MOUTH OUT, NO CHEWING GUM CANDY OR MINTS.     Take these medicines the morning of surgery with A SIP OF WATER:  amlodipine, wellbutrin, flonase, claritin, omeprazole, lyrica, effexor    DO NOT TAKE ANY DIABETIC MEDICATIONS DAY OF YOUR SURGERY                               You may not have any metal on your body including hair pins and              piercings  Do not wear jewelry, make-up, lotions, powders or perfumes, deodorant             Do not wear nail polish on your fingernails.               IF YOU ARE A MALE AND WANT TO SHAVE UNDER ARMS OR LEGS PRIOR TO SURGERY YOU MUST DO SO AT LEAST 48 HOURS PRIOR TO SURGERY.              Men may shave face and neck.   Do not bring  valuables to the hospital. [place].  Contacts, dentures or bridgework may not be worn into surgery.  Leave suitcase in the car. After surgery it may be brought to your room.     Patients discharged the day of surgery will not be allowed to drive home. IF YOU ARE HAVING SURGERY AND GOING HOME THE SAME DAY, YOU MUST HAVE AN ADULT TO DRIVE YOU HOME AND BE WITH YOU FOR 24 HOURS. YOU MAY GO HOME BY TAXI OR UBER OR ORTHERWISE, BUT AN ADULT MUST ACCOMPANY YOU HOME AND STAY WITH YOU FOR 24 HOURS.                Please read over the following fact sheets you were given: _____________________________________________________________________  [facility] - Preparing for Surgery Before surgery, you can play an important role.  Because skin is not sterile, your skin needs to be as free of germs as possible.  You can reduce the number of germs on your skin by washing with CHG (chlorahexidine gluconate) soap before surgery.  CHG is an antiseptic cleaner which kills germs and bonds with the skin to continue killing germs even after washing. Please DO NOT use if you have an allergy to CHG or antibacterial soaps.  If your skin becomes reddened/irritated stop using the CHG and inform your nurse when you arrive at Short Stay. Do not shave (including legs and underarms) for at least 48 hours prior to the first CHG shower.  You may shave your face/neck. Please follow these instructions carefully:  1.  Shower with CHG Soap the night before surgery and the  morning of Surgery.  2.  If you choose to wash your hair, wash your hair first as usual with your  normal  shampoo.  3.  After you shampoo, rinse your hair and body thoroughly to remove the  shampoo.                           4.  Use  CHG as you would any other liquid soap.  You can apply chg directly  to the skin and wash                       Gently with a scrungie or clean washcloth.  5.  Apply the CHG Soap to your body ONLY FROM THE NECK DOWN.   Do not use on face/ open                           Wound or open sores. Avoid contact with eyes, ears mouth and genitals (private parts).                       Wash face,  Genitals (private parts) with your normal soap.             6.  Wash thoroughly, paying special attention to the area where your surgery  will be performed.  7.  Thoroughly rinse your body with warm water from the neck down.  8.  DO NOT shower/wash with your normal soap after using and rinsing off  the CHG Soap.                9.  Pat yourself dry with a clean towel.  10.  Wear clean pajamas.            11.  Place clean sheets on your bed the night of your first shower and do not  sleep with pets. Day of Surgery : Do not apply any lotions/deodorants the morning of surgery.  Please wear clean clothes to the hospital/surgery center.  FAILURE TO FOLLOW THESE INSTRUCTIONS MAY RESULT IN THE CANCELLATION OF YOUR SURGERY PATIENT SIGNATURE_________________________________  NURSE SIGNATURE__________________________________  ________________________________________________________________________

## 2021-09-22 ENCOUNTER — Other Ambulatory Visit: Payer: Self-pay | Admitting: Family Medicine

## 2021-09-22 ENCOUNTER — Telehealth: Payer: Self-pay

## 2021-09-22 MED ORDER — VALSARTAN 320 MG PO TABS: 320.0000 mg | ORAL_TABLET | Freq: Every day | ORAL | 3 refills | Status: DC

## 2021-09-22 MED ORDER — AMLODIPINE BESYLATE 10 MG PO TABS: 10.0000 mg | ORAL_TABLET | Freq: Every day | ORAL | 3 refills | Status: DC

## 2021-09-22 NOTE — Telephone Encounter (Signed)
Per Dr. Dennard Schaumann, spoke with pt to increase his amlodipine from '5mg'$  to '10mg'$  plus to start Valsartan '320mg'$ .   Advice pt to come in on Friday to recheck his BP before he is clear for knee surgery.   Pt voiced understanding.

## 2021-09-22 NOTE — Telephone Encounter (Signed)
[facility], called stating that pt is going to have knee surgery on Friday(09/26/21) however, he feels that the pt's b/p is still high. Base on the last readings per PA/pt, it was 149/106 with meds for 5 days.   Pt pre-op is tom.   Please advice

## 2021-09-23 ENCOUNTER — Other Ambulatory Visit: Payer: Self-pay

## 2021-09-23 ENCOUNTER — Encounter (HOSPITAL_COMMUNITY)
Admission: RE | Admit: 2021-09-23 | Discharge: 2021-09-23 | Disposition: A | Discharge status: Home / Self Care | Payer: No Typology Code available for payment source | Source: Ambulatory Visit | Attending: Orthopedic Surgery | Admitting: Orthopedic Surgery

## 2021-09-23 ENCOUNTER — Encounter (HOSPITAL_COMMUNITY): Payer: Self-pay

## 2021-09-23 VITALS — BP 154/91 | HR 79 | Temp 98.1°F | Resp 16 | Ht 72.0 in | Wt 245.0 lb

## 2021-09-23 DIAGNOSIS — G8929 Other chronic pain: Secondary | ICD-10-CM | POA: Diagnosis not present

## 2021-09-23 DIAGNOSIS — M25561 Pain in right knee: Secondary | ICD-10-CM | POA: Insufficient documentation

## 2021-09-23 DIAGNOSIS — Z01818 Encounter for other preprocedural examination: Secondary | ICD-10-CM | POA: Diagnosis present

## 2021-09-23 HISTORY — DX: Essential (primary) hypertension: I10

## 2021-09-23 HISTORY — DX: Anxiety disorder, unspecified: F41.9

## 2021-09-23 HISTORY — DX: Dyspnea, unspecified: R06.00

## 2021-09-23 HISTORY — DX: Depression, unspecified: F32.A

## 2021-09-23 HISTORY — DX: Pneumonia, unspecified organism: J18.9

## 2021-09-23 LAB — CBC WITH DIFFERENTIAL/PLATELET
Abs Immature Granulocytes: 0.02 10*3/uL (ref 0.00–0.07)
Basophils Absolute: 0.1 10*3/uL (ref 0.0–0.1)
Basophils Relative: 1 %
Eosinophils Absolute: 0.2 10*3/uL (ref 0.0–0.5)
Eosinophils Relative: 3 %
HCT: 47.3 % (ref 39.0–52.0)
Hemoglobin: 15.4 g/dL (ref 13.0–17.0)
Immature Granulocytes: 0 %
Lymphocytes Relative: 35 %
Lymphs Abs: 1.9 10*3/uL (ref 0.7–4.0)
MCH: 31.4 pg (ref 26.0–34.0)
MCHC: 32.6 g/dL (ref 30.0–36.0)
MCV: 96.5 fL (ref 80.0–100.0)
Monocytes Absolute: 0.6 10*3/uL (ref 0.1–1.0)
Monocytes Relative: 11 %
Neutro Abs: 2.8 10*3/uL (ref 1.7–7.7)
Neutrophils Relative %: 50 %
Platelets: 271 10*3/uL (ref 150–400)
RBC: 4.9 MIL/uL (ref 4.22–5.81)
RDW: 13.1 % (ref 11.5–15.5)
WBC: 5.6 10*3/uL (ref 4.0–10.5)
nRBC: 0 % (ref 0.0–0.2)

## 2021-09-23 LAB — COMPREHENSIVE METABOLIC PANEL
ALT: 40 U/L (ref 0–44)
AST: 32 U/L (ref 15–41)
Albumin: 4.4 g/dL (ref 3.5–5.0)
Alkaline Phosphatase: 67 U/L (ref 38–126)
Anion gap: 7 (ref 5–15)
BUN: 21 mg/dL (ref 8–23)
CO2: 28 mmol/L (ref 22–32)
Calcium: 9.4 mg/dL (ref 8.9–10.3)
Chloride: 105 mmol/L (ref 98–111)
Creatinine, Ser: 0.9 mg/dL (ref 0.61–1.24)
GFR, Estimated: >60 mL/min (ref >60)
Glucose, Bld: 110 mg/dL — ABNORMAL HIGH (ref 70–99)
Potassium: 4.4 mmol/L (ref 3.5–5.1)
Sodium: 140 mmol/L (ref 135–145)
Total Bilirubin: 0.9 mg/dL (ref 0.3–1.2)
Total Protein: 7.6 g/dL (ref 6.5–8.1)

## 2021-09-23 LAB — SURGICAL PCR SCREEN
MRSA, PCR: NEGATIVE
Staphylococcus aureus: POSITIVE — AB

## 2021-09-25 ENCOUNTER — Ambulatory Visit: Payer: Self-pay | Admitting: Physician Assistant

## 2021-09-25 MED ORDER — TRANEXAMIC ACID 1000 MG/10ML IV SOLN
2000.0000 mg | INTRAVENOUS | Status: DC
  Filled 2021-09-25: qty 20

## 2021-09-25 MED ORDER — VANCOMYCIN HCL 10 G IV SOLR
1000.0000 mg | Freq: Once | INTRAVENOUS | Status: AC
  Administered 2021-09-26: 1000 mg via INTRAVENOUS

## 2021-09-25 NOTE — Anesthesia Preprocedure Evaluation (Signed)
Anesthesia Evaluation    Reviewed: Allergy & Precautions, Patient's Chart, lab work & pertinent test results  Airway        Dental   Pulmonary sleep apnea , former smoker,           Cardiovascular hypertension,      Neuro/Psych  Headaches,    GI/Hepatic Neg liver ROS, GERD  ,  Endo/Other    Renal/GU Lab Results      Component                Value               Date                      CREATININE               0.90                09/23/2021                BUN                      21                  09/23/2021                NA                       140                 09/23/2021                K                        4.4                 09/23/2021                           Musculoskeletal  (+) Arthritis , Osteoarthritis,    Abdominal   Peds  Hematology Lab Results      Component                Value               Date                      WBC                      5.6                 09/23/2021                HGB                      15.4                09/23/2021                HCT                      47.3                09/23/2021                MCV  96.5                09/23/2021                PLT                      271                 09/23/2021              Anesthesia Other Findings All: duloxetine   Reproductive/Obstetrics                            Anesthesia Physical Anesthesia Plan  ASA: 2  Anesthesia Plan: Spinal and Regional   Post-op Pain Management: Regional block*   Induction:   PONV Risk Score and Plan: 2 and Treatment may vary due to age or medical condition and Ondansetron  Airway Management Planned: Nasal Cannula and Natural Airway  Additional Equipment: None  Intra-op Plan:   Post-operative Plan:   Informed Consent:     Dental advisory given  Plan Discussed with:   Anesthesia Plan Comments: (Spinal w R adductor)         Anesthesia Quick Evaluation

## 2021-09-26 ENCOUNTER — Ambulatory Visit (HOSPITAL_BASED_OUTPATIENT_CLINIC_OR_DEPARTMENT_OTHER): Payer: No Typology Code available for payment source | Admitting: Anesthesiology

## 2021-09-26 ENCOUNTER — Encounter (INDEPENDENT_AMBULATORY_CARE_PROVIDER_SITE_OTHER): Payer: Self-pay | Admitting: *Deleted

## 2021-09-26 ENCOUNTER — Encounter: Payer: Self-pay | Admitting: *Deleted

## 2021-09-26 ENCOUNTER — Encounter (HOSPITAL_COMMUNITY)
Admission: RE | Disposition: A | Discharge status: Home / Self Care | Payer: Self-pay | Source: Home / Self Care | Attending: Orthopedic Surgery

## 2021-09-26 ENCOUNTER — Ambulatory Visit (HOSPITAL_COMMUNITY)
Admission: RE | Admit: 2021-09-26 | Discharge: 2021-09-26 | Disposition: A | Discharge status: Home / Self Care | Payer: No Typology Code available for payment source | Attending: Orthopedic Surgery | Admitting: Orthopedic Surgery

## 2021-09-26 ENCOUNTER — Other Ambulatory Visit: Payer: Self-pay | Admitting: Family Medicine

## 2021-09-26 ENCOUNTER — Ambulatory Visit (HOSPITAL_COMMUNITY): Payer: No Typology Code available for payment source | Admitting: Physician Assistant

## 2021-09-26 ENCOUNTER — Other Ambulatory Visit: Payer: Self-pay

## 2021-09-26 ENCOUNTER — Encounter (HOSPITAL_COMMUNITY): Payer: Self-pay | Admitting: Orthopedic Surgery

## 2021-09-26 DIAGNOSIS — K219 Gastro-esophageal reflux disease without esophagitis: Secondary | ICD-10-CM | POA: Diagnosis not present

## 2021-09-26 DIAGNOSIS — G8918 Other acute postprocedural pain: Secondary | ICD-10-CM | POA: Diagnosis not present

## 2021-09-26 DIAGNOSIS — Z87891 Personal history of nicotine dependence: Secondary | ICD-10-CM | POA: Diagnosis not present

## 2021-09-26 DIAGNOSIS — I10 Essential (primary) hypertension: Secondary | ICD-10-CM | POA: Diagnosis not present

## 2021-09-26 DIAGNOSIS — M1711 Unilateral primary osteoarthritis, right knee: Secondary | ICD-10-CM

## 2021-09-26 DIAGNOSIS — Z01818 Encounter for other preprocedural examination: Secondary | ICD-10-CM

## 2021-09-26 DIAGNOSIS — G4730 Sleep apnea, unspecified: Secondary | ICD-10-CM | POA: Diagnosis not present

## 2021-09-26 HISTORY — PX: TOTAL KNEE ARTHROPLASTY: SHX125

## 2021-09-26 LAB — TYPE AND SCREEN
ABO/RH(D): O NEG
Antibody Screen: NEGATIVE

## 2021-09-26 LAB — ABO/RH: ABO/RH(D): O NEG

## 2021-09-26 SURGERY — ARTHROPLASTY, KNEE, TOTAL
Anesthesia: Regional | Site: Knee | Laterality: Right

## 2021-09-26 MED ORDER — HYDROMORPHONE HCL 1 MG/ML IJ SOLN
0.2500 mg | INTRAMUSCULAR | Status: DC | PRN
  Administered 2021-09-26 (×3): 0.5 mg via INTRAVENOUS

## 2021-09-26 MED ORDER — AMISULPRIDE (ANTIEMETIC) 5 MG/2ML IV SOLN: 10.0000 mg | Freq: Once | INTRAVENOUS | Status: DC | PRN

## 2021-09-26 MED ORDER — TRANEXAMIC ACID-NACL 1000-0.7 MG/100ML-% IV SOLN
1000.0000 mg | Freq: Once | INTRAVENOUS | Status: AC
  Administered 2021-09-26: 1000 mg via INTRAVENOUS

## 2021-09-26 MED ORDER — ACETAMINOPHEN 10 MG/ML IV SOLN: 1000.0000 mg | Freq: Once | INTRAVENOUS | Status: DC | PRN

## 2021-09-26 MED ORDER — PROPOFOL 10 MG/ML IV BOLUS
INTRAVENOUS | Status: DC | PRN
  Administered 2021-09-26: 150 mg via INTRAVENOUS
  Administered 2021-09-26: 20 mg via INTRAVENOUS
  Administered 2021-09-26: 10 mg via INTRAVENOUS
  Administered 2021-09-26: 20 mg via INTRAVENOUS

## 2021-09-26 MED ORDER — PHENYLEPHRINE HCL (PRESSORS) 10 MG/ML IV SOLN
INTRAVENOUS | Status: AC
Start: 2021-09-26 — End: ?
  Filled 2021-09-26: qty 1

## 2021-09-26 MED ORDER — MIDAZOLAM HCL 2 MG/2ML IJ SOLN
INTRAMUSCULAR | Status: DC | PRN
  Administered 2021-09-26: 2 mg via INTRAVENOUS

## 2021-09-26 MED ORDER — OXYCODONE HCL 5 MG PO TABS
ORAL_TABLET | ORAL | Status: AC
  Filled 2021-09-26: qty 1

## 2021-09-26 MED ORDER — ONDANSETRON HCL 4 MG PO TABS: 4.0000 mg | ORAL_TABLET | Freq: Four times a day (QID) | ORAL | Status: DC | PRN

## 2021-09-26 MED ORDER — ACETAMINOPHEN 500 MG PO TABS: 1000.0000 mg | ORAL_TABLET | Freq: Four times a day (QID) | ORAL | 2 refills | Status: AC | PRN

## 2021-09-26 MED ORDER — BUPIVACAINE IN DEXTROSE 0.75-8.25 % IT SOLN
INTRATHECAL | Status: DC | PRN
  Administered 2021-09-26: 1.8 mg via INTRATHECAL

## 2021-09-26 MED ORDER — ROPIVACAINE HCL 5 MG/ML IJ SOLN
INTRAMUSCULAR | Status: DC | PRN
  Administered 2021-09-26: 30 mL via PERINEURAL

## 2021-09-26 MED ORDER — VANCOMYCIN HCL IN DEXTROSE 1-5 GM/200ML-% IV SOLN: 1000.0000 mg | Freq: Two times a day (BID) | INTRAVENOUS | Status: DC

## 2021-09-26 MED ORDER — ASPIRIN 81 MG PO TBEC: 81.0000 mg | DELAYED_RELEASE_TABLET | Freq: Two times a day (BID) | ORAL | 0 refills | Status: AC

## 2021-09-26 MED ORDER — BUPIVACAINE-EPINEPHRINE (PF) 0.25% -1:200000 IJ SOLN
INTRAMUSCULAR | Status: AC
  Filled 2021-09-26: qty 30

## 2021-09-26 MED ORDER — BUPIVACAINE LIPOSOME 1.3 % IJ SUSP
INTRAMUSCULAR | Status: DC | PRN
  Administered 2021-09-26: 20 mL

## 2021-09-26 MED ORDER — SODIUM CHLORIDE 0.9 % IR SOLN
Status: DC | PRN
  Administered 2021-09-26: 1000 mL

## 2021-09-26 MED ORDER — HYDROMORPHONE HCL 1 MG/ML IJ SOLN
INTRAMUSCULAR | Status: AC
  Filled 2021-09-26: qty 2

## 2021-09-26 MED ORDER — POVIDONE-IODINE 10 % EX SWAB
1.0000 | Freq: Once | CUTANEOUS | Status: AC
  Administered 2021-09-26: 1 via TOPICAL

## 2021-09-26 MED ORDER — ACETAMINOPHEN 500 MG PO TABS
1000.0000 mg | ORAL_TABLET | Freq: Once | ORAL | Status: AC
  Administered 2021-09-26: 1000 mg via ORAL
  Filled 2021-09-26: qty 2

## 2021-09-26 MED ORDER — VANCOMYCIN HCL IN DEXTROSE 1-5 GM/200ML-% IV SOLN
INTRAVENOUS | Status: AC
  Filled 2021-09-26: qty 200

## 2021-09-26 MED ORDER — TRANEXAMIC ACID-NACL 1000-0.7 MG/100ML-% IV SOLN
1000.0000 mg | INTRAVENOUS | Status: AC
  Administered 2021-09-26: 1000 mg via INTRAVENOUS
  Filled 2021-09-26: qty 100

## 2021-09-26 MED ORDER — PHENYLEPHRINE 80 MCG/ML (10ML) SYRINGE FOR IV PUSH (FOR BLOOD PRESSURE SUPPORT)
PREFILLED_SYRINGE | INTRAVENOUS | Status: DC | PRN
  Administered 2021-09-26 (×2): 80 ug via INTRAVENOUS

## 2021-09-26 MED ORDER — BUPIVACAINE LIPOSOME 1.3 % IJ SUSP
INTRAMUSCULAR | Status: AC
  Filled 2021-09-26: qty 20

## 2021-09-26 MED ORDER — CLONIDINE HCL (ANALGESIA) 100 MCG/ML EP SOLN
EPIDURAL | Status: DC | PRN
  Administered 2021-09-26: 100 ug

## 2021-09-26 MED ORDER — ACETAMINOPHEN 325 MG PO TABS: 325.0000 mg | ORAL_TABLET | Freq: Four times a day (QID) | ORAL | Status: DC | PRN

## 2021-09-26 MED ORDER — OXYCODONE HCL 5 MG/5ML PO SOLN: 5.0000 mg | Freq: Once | ORAL | Status: AC | PRN

## 2021-09-26 MED ORDER — OXYCODONE HCL 5 MG PO TABS
5.0000 mg | ORAL_TABLET | ORAL | Status: DC | PRN
  Administered 2021-09-26: 5 mg via ORAL

## 2021-09-26 MED ORDER — WATER FOR IRRIGATION, STERILE IR SOLN
Status: DC | PRN
  Administered 2021-09-26: 2000 mL

## 2021-09-26 MED ORDER — SODIUM CHLORIDE 0.9 % IV SOLN: INTRAVENOUS | Status: DC

## 2021-09-26 MED ORDER — BUPIVACAINE LIPOSOME 1.3 % IJ SUSP: 20.0000 mL | Freq: Once | INTRAMUSCULAR | Status: DC

## 2021-09-26 MED ORDER — SODIUM CHLORIDE 0.9 % IV SOLN
INTRAVENOUS | Status: DC | PRN
  Administered 2021-09-26: 1000 mL

## 2021-09-26 MED ORDER — PROPOFOL 10 MG/ML IV BOLUS
INTRAVENOUS | Status: AC
  Filled 2021-09-26: qty 20

## 2021-09-26 MED ORDER — PROPOFOL 1000 MG/100ML IV EMUL
INTRAVENOUS | Status: AC
  Filled 2021-09-26: qty 100

## 2021-09-26 MED ORDER — FENTANYL CITRATE (PF) 100 MCG/2ML IJ SOLN
INTRAMUSCULAR | Status: AC
  Filled 2021-09-26: qty 2

## 2021-09-26 MED ORDER — BUPIVACAINE-EPINEPHRINE (PF) 0.25% -1:200000 IJ SOLN
INTRAMUSCULAR | Status: DC | PRN
  Administered 2021-09-26: 30 mL

## 2021-09-26 MED ORDER — LACTATED RINGERS IV BOLUS
500.0000 mL | Freq: Once | INTRAVENOUS | Status: AC
  Administered 2021-09-26: 500 mL via INTRAVENOUS

## 2021-09-26 MED ORDER — EPHEDRINE 5 MG/ML INJ
INTRAVENOUS | Status: AC
  Filled 2021-09-26: qty 5

## 2021-09-26 MED ORDER — OXYCODONE HCL 5 MG PO TABS: ORAL_TABLET | ORAL | 0 refills | Status: DC

## 2021-09-26 MED ORDER — TRANEXAMIC ACID-NACL 1000-0.7 MG/100ML-% IV SOLN
INTRAVENOUS | Status: AC
  Filled 2021-09-26: qty 100

## 2021-09-26 MED ORDER — ONDANSETRON HCL 4 MG/2ML IJ SOLN
INTRAMUSCULAR | Status: DC | PRN
  Administered 2021-09-26: 4 mg via INTRAVENOUS

## 2021-09-26 MED ORDER — MIDAZOLAM HCL 2 MG/2ML IJ SOLN
INTRAMUSCULAR | Status: AC
  Filled 2021-09-26: qty 2

## 2021-09-26 MED ORDER — VANCOMYCIN HCL 1500 MG/300ML IV SOLN
1500.0000 mg | Freq: Once | INTRAVENOUS | Status: DC
  Filled 2021-09-26: qty 300

## 2021-09-26 MED ORDER — ONDANSETRON HCL 4 MG/2ML IJ SOLN: 4.0000 mg | Freq: Four times a day (QID) | INTRAMUSCULAR | Status: DC | PRN

## 2021-09-26 MED ORDER — METOCLOPRAMIDE HCL 5 MG PO TABS: 5.0000 mg | ORAL_TABLET | Freq: Three times a day (TID) | ORAL | Status: DC | PRN

## 2021-09-26 MED ORDER — DEXAMETHASONE SODIUM PHOSPHATE 10 MG/ML IJ SOLN
INTRAMUSCULAR | Status: AC
  Filled 2021-09-26: qty 1

## 2021-09-26 MED ORDER — HYDROMORPHONE HCL 1 MG/ML IJ SOLN: 0.5000 mg | INTRAMUSCULAR | Status: DC | PRN

## 2021-09-26 MED ORDER — TRANEXAMIC ACID 1000 MG/10ML IV SOLN
INTRAVENOUS | Status: DC | PRN
  Administered 2021-09-26: 2000 mg via TOPICAL

## 2021-09-26 MED ORDER — PHENYLEPHRINE HCL (PRESSORS) 10 MG/ML IV SOLN
INTRAVENOUS | Status: AC
  Filled 2021-09-26: qty 1

## 2021-09-26 MED ORDER — VANCOMYCIN HCL 1000 MG IV SOLR: 1000.0000 mg | Freq: Once | INTRAVENOUS | Status: DC

## 2021-09-26 MED ORDER — ACETAMINOPHEN 500 MG PO TABS: 1000.0000 mg | ORAL_TABLET | Freq: Four times a day (QID) | ORAL | Status: DC

## 2021-09-26 MED ORDER — SODIUM CHLORIDE (PF) 0.9 % IJ SOLN
INTRAMUSCULAR | Status: AC
  Filled 2021-09-26: qty 50

## 2021-09-26 MED ORDER — OXYCODONE HCL 5 MG PO TABS
5.0000 mg | ORAL_TABLET | Freq: Once | ORAL | Status: AC | PRN
  Administered 2021-09-26: 5 mg via ORAL

## 2021-09-26 MED ORDER — ORAL CARE MOUTH RINSE: 15.0000 mL | Freq: Once | OROMUCOSAL | Status: AC

## 2021-09-26 MED ORDER — LACTATED RINGERS IV SOLN
INTRAVENOUS | Status: DC
  Administered 2021-09-26: 1000 mL via INTRAVENOUS

## 2021-09-26 MED ORDER — PROPOFOL 500 MG/50ML IV EMUL
INTRAVENOUS | Status: DC | PRN
  Administered 2021-09-26: 125 ug/kg/min via INTRAVENOUS

## 2021-09-26 MED ORDER — FENTANYL CITRATE (PF) 100 MCG/2ML IJ SOLN
INTRAMUSCULAR | Status: DC | PRN
  Administered 2021-09-26: 50 ug via INTRAVENOUS

## 2021-09-26 MED ORDER — CEFAZOLIN SODIUM-DEXTROSE 2-4 GM/100ML-% IV SOLN
2.0000 g | INTRAVENOUS | Status: AC
  Administered 2021-09-26: 2 g via INTRAVENOUS
  Filled 2021-09-26: qty 100

## 2021-09-26 MED ORDER — PHENYLEPHRINE HCL-NACL 20-0.9 MG/250ML-% IV SOLN
INTRAVENOUS | Status: DC | PRN
  Administered 2021-09-26: 30 ug/min via INTRAVENOUS

## 2021-09-26 MED ORDER — LACTATED RINGERS IV BOLUS
250.0000 mL | Freq: Once | INTRAVENOUS | Status: AC
  Administered 2021-09-26: 250 mL via INTRAVENOUS

## 2021-09-26 MED ORDER — DEXAMETHASONE SODIUM PHOSPHATE 10 MG/ML IJ SOLN
INTRAMUSCULAR | Status: DC | PRN
  Administered 2021-09-26: 8 mg via INTRAVENOUS

## 2021-09-26 MED ORDER — CHLORHEXIDINE GLUCONATE 0.12 % MT SOLN
15.0000 mL | Freq: Once | OROMUCOSAL | Status: AC
  Administered 2021-09-26: 15 mL via OROMUCOSAL

## 2021-09-26 MED ORDER — ONDANSETRON HCL 4 MG/2ML IJ SOLN
INTRAMUSCULAR | Status: AC
  Filled 2021-09-26: qty 2

## 2021-09-26 MED ORDER — EPHEDRINE SULFATE-NACL 50-0.9 MG/10ML-% IV SOSY
PREFILLED_SYRINGE | INTRAVENOUS | Status: DC | PRN
  Administered 2021-09-26: 10 mg via INTRAVENOUS
  Administered 2021-09-26: 5 mg via INTRAVENOUS
  Administered 2021-09-26: 10 mg via INTRAVENOUS
  Administered 2021-09-26 (×2): 5 mg via INTRAVENOUS

## 2021-09-26 MED ORDER — SODIUM CHLORIDE 0.9% FLUSH
INTRAVENOUS | Status: DC | PRN
  Administered 2021-09-26: 50 mL

## 2021-09-26 MED ORDER — METOCLOPRAMIDE HCL 5 MG/ML IJ SOLN: 5.0000 mg | Freq: Three times a day (TID) | INTRAMUSCULAR | Status: DC | PRN

## 2021-09-26 MED ORDER — ONDANSETRON HCL 4 MG/2ML IJ SOLN: 4.0000 mg | Freq: Once | INTRAMUSCULAR | Status: DC | PRN

## 2021-09-26 SURGICAL SUPPLY — 59 items
ATTUNE MED DOME PAT 41 KNEE (Knees) ×1 IMPLANT
ATTUNE PS FEM RT SZ 7 CEM KNEE (Femur) ×1 IMPLANT
ATTUNE PSRP INSR SZ7 5 KNEE (Insert) ×1 IMPLANT
BAG COUNTER SPONGE SURGICOUNT (BAG) ×2 IMPLANT
BAG DECANTER FOR FLEXI CONT (MISCELLANEOUS) ×2 IMPLANT
BAG ZIPLOCK 12X15 (MISCELLANEOUS) ×2 IMPLANT
BASE TIBIAL ROT PLAT SZ 7 KNEE (Knees) IMPLANT
BLADE SAGITTAL 25.0X1.19X90 (BLADE) ×2 IMPLANT
BLADE SAW SGTL 13X75X1.27 (BLADE) ×2 IMPLANT
BLADE SURG 15 STRL LF DISP TIS (BLADE) ×1 IMPLANT
BLADE SURG 15 STRL SS (BLADE) ×2
BLADE SURG SZ10 CARB STEEL (BLADE) ×4 IMPLANT
BNDG ELASTIC 6X10 VLCR STRL LF (GAUZE/BANDAGES/DRESSINGS) ×1 IMPLANT
BNDG ELASTIC 6X15 VLCR STRL LF (GAUZE/BANDAGES/DRESSINGS) ×2 IMPLANT
BONE CEMENT GENTAMICIN (Cement) ×4 IMPLANT
BOWL SMART MIX CTS (DISPOSABLE) ×2 IMPLANT
CEMENT BONE GENTAMICIN 40 (Cement) IMPLANT
CLSR STERI-STRIP ANTIMIC 1/2X4 (GAUZE/BANDAGES/DRESSINGS) ×4 IMPLANT
COVER SURGICAL LIGHT HANDLE (MISCELLANEOUS) ×2 IMPLANT
CUFF TOURN SGL QUICK 34 (TOURNIQUET CUFF) ×2
CUFF TRNQT CYL 34X4.125X (TOURNIQUET CUFF) ×1 IMPLANT
DRAPE INCISE IOBAN 66X45 STRL (DRAPES) ×2 IMPLANT
DRAPE U-SHAPE 47X51 STRL (DRAPES) ×2 IMPLANT
DRESSING AQUACEL AG SP 3.5X10 (GAUZE/BANDAGES/DRESSINGS) ×1 IMPLANT
DRSG AQUACEL AG SP 3.5X10 (GAUZE/BANDAGES/DRESSINGS) ×2
DURAPREP 26ML APPLICATOR (WOUND CARE) ×4 IMPLANT
ELECT REM PT RETURN 15FT ADLT (MISCELLANEOUS) ×2 IMPLANT
GLOVE BIOGEL PI IND STRL 8 (GLOVE) ×2 IMPLANT
GLOVE BIOGEL PI INDICATOR 8 (GLOVE) ×2
GLOVE SURG ORTHO 8.0 STRL STRW (GLOVE) ×3 IMPLANT
GLOVE SURG POLYISO LF SZ7.5 (GLOVE) ×4 IMPLANT
GOWN STRL REUS W/ TWL XL LVL3 (GOWN DISPOSABLE) ×2 IMPLANT
GOWN STRL REUS W/TWL XL LVL3 (GOWN DISPOSABLE) ×4
HANDPIECE INTERPULSE COAX TIP (DISPOSABLE) ×2
HOLDER FOLEY CATH W/STRAP (MISCELLANEOUS) ×1 IMPLANT
HOOD PEEL AWAY FLYTE STAYCOOL (MISCELLANEOUS) ×2 IMPLANT
IMMOBILIZER KNEE 20 (SOFTGOODS) ×2
IMMOBILIZER KNEE 20 THIGH 36 (SOFTGOODS) ×1 IMPLANT
KIT TURNOVER KIT A (KITS) ×1 IMPLANT
MANIFOLD NEPTUNE II (INSTRUMENTS) ×2 IMPLANT
NEEDLE HYPO 22GX1.5 SAFETY (NEEDLE) ×4 IMPLANT
NS IRRIG 1000ML POUR BTL (IV SOLUTION) ×2 IMPLANT
PACK TOTAL KNEE CUSTOM (KITS) ×2 IMPLANT
PROTECTOR NERVE ULNAR (MISCELLANEOUS) ×2 IMPLANT
SET HNDPC FAN SPRY TIP SCT (DISPOSABLE) ×1 IMPLANT
SPIKE FLUID TRANSFER (MISCELLANEOUS) ×4 IMPLANT
STRIP CLOSURE SKIN 1/2X4 (GAUZE/BANDAGES/DRESSINGS) ×2 IMPLANT
SUT ETHIBOND NAB CT1 #1 30IN (SUTURE) ×4 IMPLANT
SUT MNCRL AB 3-0 PS2 18 (SUTURE) ×2 IMPLANT
SUT VIC AB 0 CT1 36 (SUTURE) ×2 IMPLANT
SUT VIC AB 2-0 CT1 27 (SUTURE) ×4
SUT VIC AB 2-0 CT1 TAPERPNT 27 (SUTURE) ×2 IMPLANT
SYR CONTROL 10ML LL (SYRINGE) ×6 IMPLANT
TIBIAL BASE ROT PLAT SZ 7 KNEE (Knees) ×2 IMPLANT
TOWEL OR 17X26 10 PK STRL BLUE (TOWEL DISPOSABLE) ×2 IMPLANT
TRAY FOLEY MTR SLVR 16FR STAT (SET/KITS/TRAYS/PACK) ×2 IMPLANT
TUBE SUCTION HIGH CAP CLEAR NV (SUCTIONS) ×2 IMPLANT
WATER STERILE IRR 1000ML POUR (IV SOLUTION) ×4 IMPLANT
WRAP KNEE MAXI GEL POST OP (GAUZE/BANDAGES/DRESSINGS) ×2 IMPLANT

## 2021-09-26 NOTE — Op Note (Signed)
[name], Derek [name] MEDICAL RECORD NO: [id number] ACCOUNT NO: [id number] DATE OF BIRTH: 16-Oct-1960 FACILITY: [facility] LOCATION: [facility] PHYSICIAN: W D. Valeta Harms., MD  Operative Report   DATE OF PROCEDURE: 09/26/2021  PREOPERATIVE DIAGNOSES:  Severe osteoarthritis, right knee.  POSTOPERATIVE DIAGNOSES:  Severe osteoarthritis, right knee.  PROCEDURE PERFORMED:  Right total knee replacement (Attune DePuy cemented knee, size 7 femur, size 7 tibia, 5 mm bearing with 41 mm all poly patella.  SURGEON:  Dr. French Ana.  ASSISTANT:  [place], PA.  TOURNIQUET TIME:  61 minutes.  ANESTHETIC:  Spinal/block/general.  DESCRIPTION OF PROCEDURE:  Supine position exsanguination of the leg, inflation of thigh tourniquet to 350.  Straight skin incision with a medial parapatellar approach to the knee was made.  We did a 5-degree, 10 mm valgus cut on the femur followed by  cutting about 3 mm below the most diseased lateral compartment, followed by second provisional cut on the tibia, 1 mm to obtain full extension.  Femur was sized to be a size 7.  Placement of the awl and one cutting block in the appropriate degree of  external rotation, accomplishing the anterior, posterior and chamfer cuts.  Complete release of the PCL was done as well as removal of all meniscal remnants and removal of posterior osteophytes.  The capsular and subcutaneous tissues were injected with a  mixture of Marcaine and Exparel.  Tibia was sized to be a size 7, placed in the keel cut for the tibia and the tibial trial followed by the box cut on the femur and placement of the femoral trial.  Patella was cut, resecting about 9.5 mm of bone off the  patella.  Placement of the all poly trial.  The patient was noted to have full extension with excellent balancing of the ligaments.  Cement was prepared on the back table with antibiotic impregnated cement done due to the patient being Staph positive.   Cement was inserted. The components were  inserted in the tibia followed by femur and patella with the trial bearing.  Cement was allowed to harden.  Trial bearing was removed, no excess cement was noted in the posterior knee.  The tourniquet was released  under direct vision, no excessive bleeding was noted.  Small bleeders were coagulated.  Final bearing was placed.  Closure was affected with #1 Ethibond, 2-0 Vicryl and Monocryl in the skin.  A light dressing was applied.  Taken to recovery room in  stable condition.   MUK D: 09/26/2021 9:25:51 am T: 09/26/2021 10:10:00 am  JOB: [id number]/ [id number]

## 2021-09-26 NOTE — Evaluation (Signed)
Physical Therapy Evaluation Patient Details [name] MRN: [id number] DOB: 16-Sep-1960 Today's Date: 09/26/2021  History of Present Illness  Pt s/p R TKR and with hx of obesity, DDD and multiple back surgeries  Clinical Impression  Pt s/p R TKR and presents with decreased R LE strength/ROM and post op pain limiting functional mobility.  Pt should progress to dc home with family assist and reports first OP PT scheduled for 09/29/21.  Pt BP noted in initial standing 86/69 but raised to 104/75 with minimal activity and pt able to ambulate in hall and negotiate single step.  Session terminated with onset of significant bleeding at surgery site and pt returned to chair and RN attending.  Will follow up to complete session with pt hoping for dc this date.     Recommendations for follow up therapy are one component of a multi-disciplinary discharge planning process, led by the attending physician.  Recommendations may be updated based on patient status, additional functional criteria and insurance authorization.  Follow Up Recommendations Follow physician's recommendations for discharge plan and follow up therapies      Assistance Recommended at Discharge Frequent or constant Supervision/Assistance  Patient can return home with the following  A little help with walking and/or transfers;A little help with bathing/dressing/bathroom;Assistance with cooking/housework;Assist for transportation;Help with stairs or ramp for entrance    Equipment Recommendations None recommended by PT  Recommendations for Other Services       Functional Status Assessment Patient has had a recent decline in their functional status and demonstrates the ability to make significant improvements in function in a reasonable and predictable amount of time.     Precautions / Restrictions Precautions Precautions: Knee;Fall Required Braces or Orthoses: Knee Immobilizer - Right Knee Immobilizer - Right: Discontinue  once straight leg raise with < 10 degree lag (Pt performed IND SLR this date) Restrictions Weight Bearing Restrictions: No Other Position/Activity Restrictions: WBAT      Mobility  Bed Mobility Overal bed mobility: Needs Assistance Bed Mobility: Supine to Sit     Supine to sit: Supervision     General bed mobility comments: sup for safety but no physical assist required    Transfers Overall transfer level: Needs assistance Equipment used: Rolling walker (2 wheels) Transfers: Sit to/from Stand Sit to Stand: Min guard           General transfer comment: cues for LE management and use of UEs to self assist.    Ambulation/Gait Ambulation/Gait assistance: Min guard Gait Distance (Feet): 85 Feet Assistive device: Rolling walker (2 wheels) Gait Pattern/deviations: Step-to pattern, Decreased step length - right, Decreased step length - left, Shuffle, Trunk flexed Gait velocity: decr     General Gait Details: cues for sequence, posture and position from RW  Stairs Stairs: Yes Stairs assistance: Min assist Stair Management: No rails, Forwards, Step to pattern, With walker Number of Stairs: 1 General stair comments: cues for sequence  Wheelchair Mobility    Modified Rankin (Stroke Patients Only)       Balance Overall balance assessment: Mild deficits observed, not formally tested                                           Pertinent Vitals/Pain Pain Assessment Pain Assessment: 0-10 Pain Score: 5  Pain Location: R knee with activity Pain Descriptors / Indicators: Aching, Sore Pain Intervention(s): Limited activity within patient's  tolerance, Monitored during session, Premedicated before session    Home Living Family/patient expects to be discharged to:: Private residence Living Arrangements: Spouse/significant other Available Help at Discharge: Family;Available 24 hours/day Type of Home: House Home Access: Stairs to enter Entrance  Stairs-Rails: Right;Left;Can reach both Entrance Stairs-Number of Steps: 2+1   Home Layout: One level Home Equipment: Agricultural consultant (2 wheels);Cane - single point Additional Comments: Spouse is an ICU nurse    Prior Function Prior Level of Function : Independent/Modified Independent             Mobility Comments: using cane occasionally       Hand Dominance        Extremity/Trunk Assessment   Upper Extremity Assessment Upper Extremity Assessment: Overall WFL for tasks assessed    Lower Extremity Assessment Lower Extremity Assessment: RLE deficits/detail RLE Deficits / Details: AAROM at knee -5 - 65 with 3/5 quads and performing IND SLR    Cervical / Trunk Assessment Cervical / Trunk Assessment: Normal  Communication   Communication: No difficulties  Cognition Arousal/Alertness: Awake/alert Behavior During Therapy: WFL for tasks assessed/performed Overall Cognitive Status: Within Functional Limits for tasks assessed                                          General Comments      Exercises Total Joint Exercises Ankle Circles/Pumps: AROM, Both, 15 reps, Supine Quad Sets: AROM, Both, 5 reps, Supine Heel Slides: AAROM, Right, 10 reps, Supine Straight Leg Raises: AAROM, AROM, Right, 10 reps, Supine   Assessment/Plan    PT Assessment Patient needs continued PT services  PT Problem List Decreased strength;Decreased range of motion;Decreased activity tolerance;Decreased balance;Decreased mobility;Decreased knowledge of use of DME;Pain       PT Treatment Interventions DME instruction;Gait training;Stair training;Functional mobility training;Therapeutic activities;Therapeutic exercise;Patient/family education;Balance training    PT Goals (Current goals can be found in the Care Plan section)  Acute Rehab PT Goals Patient Stated Goal: Regain IND PT Goal Formulation: With patient Time For Goal Achievement: 10/03/21 Potential to Achieve Goals:  Good    Frequency 7X/week     Co-evaluation               AM-PAC PT "6 Clicks" Mobility  Outcome Measure Help needed turning from your back to your side while in a flat bed without using bedrails?: A Little Help needed moving from lying on your back to sitting on the side of a flat bed without using bedrails?: A Little Help needed moving to and from a bed to a chair (including a wheelchair)?: A Little Help needed standing up from a chair using your arms (e.g., wheelchair or bedside chair)?: A Little Help needed to walk in hospital room?: A Little Help needed climbing 3-5 steps with a railing? : A Little 6 Click Score: 18    End of Session Equipment Utilized During Treatment: Gait belt Activity Tolerance: Patient tolerated treatment well Patient left: in chair;with call bell/phone within reach;with nursing/sitter in room;with family/visitor present Nurse Communication: Mobility status PT Visit Diagnosis: Difficulty in walking, not elsewhere classified (R26.2)    Time: 1300-1330 PT Time Calculation (min) (ACUTE ONLY): 30 min   Charges:   PT Evaluation $PT Eval Low Complexity: 1 Low PT Treatments $Therapeutic Exercise: 8-22 mins        Mauro Kaufmann PT Acute Rehabilitation Services Pager [phone] Office [phone]   Derek [name] 09/26/2021,  2:18 PM

## 2021-09-29 ENCOUNTER — Ambulatory Visit (HOSPITAL_COMMUNITY): Payer: No Typology Code available for payment source | Attending: Orthopedic Surgery | Admitting: Physical Therapy

## 2021-09-29 ENCOUNTER — Encounter (HOSPITAL_COMMUNITY): Payer: Self-pay | Admitting: Orthopedic Surgery

## 2021-09-29 DIAGNOSIS — R2689 Other abnormalities of gait and mobility: Secondary | ICD-10-CM | POA: Diagnosis not present

## 2021-09-29 DIAGNOSIS — M25561 Pain in right knee: Secondary | ICD-10-CM | POA: Diagnosis not present

## 2021-09-29 DIAGNOSIS — M25661 Stiffness of right knee, not elsewhere classified: Secondary | ICD-10-CM | POA: Diagnosis not present

## 2021-09-29 MED ORDER — JARDIANCE 25 MG PO TABS
ORAL_TABLET | 0 refills
Start: 2021-09-29 — End: ?

## 2021-10-01 MED ORDER — JARDIANCE 25 MG PO TABS
ORAL_TABLET | 0 refills | Status: AC
Start: 2021-10-01 — End: ?

## 2021-10-02 ENCOUNTER — Other Ambulatory Visit: Payer: Self-pay | Admitting: Family Medicine

## 2021-10-02 NOTE — Telephone Encounter (Signed)
Requested medication (s) are due for refill today- yes  Requested medication (s) are on the active medication list -yes  Future visit scheduled -no  Last refill: 07/15/21 #60 2RF  Notes to clinic: non delegated Rx  Requested Prescriptions  Pending Prescriptions Disp Refills   cyclobenzaprine (FLEXERIL) 5 MG tablet [Pharmacy Med Name: CYCLOBENZAPRINE '5MG'$  TABLETS] 60 tablet 2    Sig: TAKE 1 TABLET BY MOUTH THREE TIMES DAILY AS NEEDED FOR MUSCLE SPASMS     Not Delegated - Analgesics:  Muscle Relaxants Failed - 10/02/2021  1:11 PM      Failed - This refill cannot be delegated      Passed - Valid encounter within last 6 months    Recent Outpatient Visits           1 month ago Pure hypercholesterolemia   [place] Pickard, Cammie Mcgee, MD   4 months ago Current moderate episode of major depressive disorder without prior episode ([place])   [place] Pickard, Cammie Mcgee, MD   5 months ago Viral upper respiratory tract infection   [place] Susy Frizzle, MD   7 months ago Acute pain of right knee   [place] [facility], Cammie Mcgee, MD   8 months ago Need for immunization against influenza   [place], Cammie Mcgee, MD                 Requested Prescriptions  Pending Prescriptions Disp Refills   cyclobenzaprine (FLEXERIL) 5 MG tablet [Pharmacy Med Name: CYCLOBENZAPRINE '5MG'$  TABLETS] 60 tablet 2    Sig: TAKE 1 TABLET BY MOUTH THREE TIMES DAILY AS NEEDED FOR MUSCLE SPASMS     Not Delegated - Analgesics:  Muscle Relaxants Failed - 10/02/2021  1:11 PM      Failed - This refill cannot be delegated      Passed - Valid encounter within last 6 months    Recent Outpatient Visits           1 month ago Pure hypercholesterolemia   [place], Cammie Mcgee, MD   4 months ago Current moderate episode of major depressive disorder without prior episode ([place])   [place] Pickard, Cammie Mcgee, MD   5 months ago Viral upper respiratory tract infection   [place], Warren T, MD   7 months ago Acute pain of right knee   [place] Pickard, Cammie Mcgee, MD   8 months ago Need for immunization against influenza   [place] Pickard, Cammie Mcgee, MD

## 2021-10-06 ENCOUNTER — Encounter (HOSPITAL_COMMUNITY): Payer: Self-pay | Admitting: Physical Therapy

## 2021-10-06 ENCOUNTER — Other Ambulatory Visit: Payer: Self-pay

## 2021-10-06 ENCOUNTER — Ambulatory Visit (HOSPITAL_COMMUNITY): Payer: No Typology Code available for payment source | Attending: Orthopedic Surgery | Admitting: Physical Therapy

## 2021-10-06 DIAGNOSIS — M25661 Stiffness of right knee, not elsewhere classified: Secondary | ICD-10-CM | POA: Diagnosis not present

## 2021-10-06 DIAGNOSIS — M25561 Pain in right knee: Secondary | ICD-10-CM | POA: Diagnosis not present

## 2021-10-06 DIAGNOSIS — R2689 Other abnormalities of gait and mobility: Secondary | ICD-10-CM | POA: Insufficient documentation

## 2021-10-06 MED ORDER — CYCLOBENZAPRINE HCL 5 MG PO TABS: 5.0000 mg | ORAL_TABLET | Freq: Three times a day (TID) | ORAL | 2 refills | Status: DC | PRN
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
## 2021-10-08 ENCOUNTER — Ambulatory Visit (HOSPITAL_COMMUNITY): Payer: No Typology Code available for payment source | Admitting: Physical Therapy

## 2021-10-08 ENCOUNTER — Encounter (HOSPITAL_COMMUNITY): Payer: Self-pay | Admitting: Physical Therapy

## 2021-10-08 DIAGNOSIS — M25561 Pain in right knee: Secondary | ICD-10-CM

## 2021-10-08 DIAGNOSIS — M25661 Stiffness of right knee, not elsewhere classified: Secondary | ICD-10-CM

## 2021-10-08 DIAGNOSIS — R2689 Other abnormalities of gait and mobility: Secondary | ICD-10-CM
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
## 2021-10-10 ENCOUNTER — Ambulatory Visit (HOSPITAL_COMMUNITY): Payer: No Typology Code available for payment source

## 2021-10-10 ENCOUNTER — Encounter (HOSPITAL_COMMUNITY): Payer: Self-pay

## 2021-10-10 ENCOUNTER — Encounter (HOSPITAL_COMMUNITY): Payer: Medicare Other

## 2021-10-10 DIAGNOSIS — M25561 Pain in right knee: Secondary | ICD-10-CM

## 2021-10-10 DIAGNOSIS — R2689 Other abnormalities of gait and mobility: Secondary | ICD-10-CM | POA: Diagnosis not present

## 2021-10-10 DIAGNOSIS — M25661 Stiffness of right knee, not elsewhere classified: Secondary | ICD-10-CM
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
## 2021-10-13 ENCOUNTER — Ambulatory Visit (HOSPITAL_COMMUNITY): Payer: No Typology Code available for payment source

## 2021-10-13 ENCOUNTER — Encounter: Payer: Self-pay | Admitting: Family Medicine

## 2021-10-13 DIAGNOSIS — M25561 Pain in right knee: Secondary | ICD-10-CM | POA: Diagnosis not present

## 2021-10-13 DIAGNOSIS — R2689 Other abnormalities of gait and mobility: Secondary | ICD-10-CM | POA: Diagnosis not present

## 2021-10-13 DIAGNOSIS — M25661 Stiffness of right knee, not elsewhere classified: Secondary | ICD-10-CM | POA: Diagnosis not present
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
## 2021-10-15 ENCOUNTER — Encounter (HOSPITAL_COMMUNITY): Payer: Self-pay | Admitting: Physical Therapy

## 2021-10-15 ENCOUNTER — Ambulatory Visit (HOSPITAL_COMMUNITY): Payer: No Typology Code available for payment source | Admitting: Physical Therapy

## 2021-10-15 DIAGNOSIS — R2689 Other abnormalities of gait and mobility: Secondary | ICD-10-CM | POA: Diagnosis not present

## 2021-10-15 DIAGNOSIS — M25661 Stiffness of right knee, not elsewhere classified: Secondary | ICD-10-CM

## 2021-10-15 DIAGNOSIS — M25561 Pain in right knee: Secondary | ICD-10-CM | POA: Diagnosis not present
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
## 2021-10-16 ENCOUNTER — Other Ambulatory Visit: Payer: Self-pay | Admitting: Family Medicine

## 2021-10-16 NOTE — Telephone Encounter (Signed)
Requested medication (s) are due for refill today: Yes  Requested medication (s) are on the active medication list: Yes  Last refill:  09/11/21  Future visit scheduled: No  Notes to clinic:  Unable to refill per protocol, cannot delegate.      Requested Prescriptions  Pending Prescriptions Disp Refills   pregabalin (LYRICA) 150 MG capsule [Pharmacy Med Name: PREGABALIN '150MG'$  CAPSULES] 60 capsule     Sig: TAKE 1 CAPSULE(150 MG) BY MOUTH TWICE DAILY     Not Delegated - Neurology:  Anticonvulsants - Controlled - pregabalin Failed - 10/16/2021  2:30 PM      Failed - This refill cannot be delegated      Passed - Cr in normal range and within 360 days    Creat  Date Value Ref Range Status  08/04/2021 0.97 0.70 - 1.35 mg/dL Final   Creatinine, Ser  Date Value Ref Range Status  09/23/2021 0.90 0.61 - 1.24 mg/dL Final         Passed - Completed PHQ-2 or PHQ-9 in the last 360 days      Passed - Valid encounter within last 12 months    Recent Outpatient Visits           2 months ago Pure hypercholesterolemia   [place] Susy Frizzle, MD   5 months ago Current moderate episode of major depressive disorder without prior episode ([facility])   [place] Susy Frizzle, MD   6 months ago Viral upper respiratory tract infection   [place], Warren T, MD   8 months ago Acute pain of right knee   [place] Pick[place], Cammie Mcgee, MD   8 months ago Need for immunization against influenza   [place], Cammie Mcgee, MD

## 2021-10-17 ENCOUNTER — Ambulatory Visit (HOSPITAL_COMMUNITY): Payer: No Typology Code available for payment source

## 2021-10-17 ENCOUNTER — Encounter (HOSPITAL_COMMUNITY): Payer: Self-pay

## 2021-10-17 ENCOUNTER — Encounter (HOSPITAL_COMMUNITY): Payer: Medicare Other

## 2021-10-17 DIAGNOSIS — M25661 Stiffness of right knee, not elsewhere classified: Secondary | ICD-10-CM | POA: Diagnosis not present

## 2021-10-17 DIAGNOSIS — R2689 Other abnormalities of gait and mobility: Secondary | ICD-10-CM

## 2021-10-17 DIAGNOSIS — M25561 Pain in right knee: Secondary | ICD-10-CM
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
## 2021-10-20 ENCOUNTER — Encounter (HOSPITAL_COMMUNITY): Payer: Self-pay | Admitting: Physical Therapy

## 2021-10-20 ENCOUNTER — Ambulatory Visit (HOSPITAL_COMMUNITY): Payer: No Typology Code available for payment source | Admitting: Physical Therapy

## 2021-10-20 DIAGNOSIS — M25661 Stiffness of right knee, not elsewhere classified: Secondary | ICD-10-CM | POA: Diagnosis not present

## 2021-10-20 DIAGNOSIS — R2689 Other abnormalities of gait and mobility: Secondary | ICD-10-CM | POA: Diagnosis not present

## 2021-10-20 DIAGNOSIS — M25561 Pain in right knee: Secondary | ICD-10-CM
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
## 2021-10-21 ENCOUNTER — Ambulatory Visit (INDEPENDENT_AMBULATORY_CARE_PROVIDER_SITE_OTHER): Payer: No Typology Code available for payment source | Admitting: Family Medicine

## 2021-10-21 ENCOUNTER — Encounter: Payer: Self-pay | Admitting: Family Medicine

## 2021-10-21 VITALS — BP 122/76 | HR 88 | Temp 97.8°F | Ht 73.0 in | Wt 237.4 lb

## 2021-10-21 DIAGNOSIS — I10 Essential (primary) hypertension: Secondary | ICD-10-CM | POA: Diagnosis not present

## 2021-10-21 MED ORDER — PREGABALIN 150 MG PO CAPS
150.0000 mg | ORAL_CAPSULE | Freq: Two times a day (BID) | ORAL | 3 refills | Status: DC
Start: 2021-10-21 — End: 2022-02-19
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
## 2021-10-22 ENCOUNTER — Ambulatory Visit (HOSPITAL_COMMUNITY): Payer: No Typology Code available for payment source | Admitting: Physical Therapy

## 2021-10-22 DIAGNOSIS — M25661 Stiffness of right knee, not elsewhere classified: Secondary | ICD-10-CM

## 2021-10-22 DIAGNOSIS — R2689 Other abnormalities of gait and mobility: Secondary | ICD-10-CM

## 2021-10-22 DIAGNOSIS — M25561 Pain in right knee: Secondary | ICD-10-CM

## 2021-10-22 LAB — BASIC METABOLIC PANEL WITH GFR
BUN: 16 mg/dL (ref 7–25)
CO2: 29 mmol/L (ref 20–32)
Calcium: 10.5 mg/dL — ABNORMAL HIGH (ref 8.6–10.3)
Chloride: 100 mmol/L (ref 98–110)
Creat: 1.02 mg/dL (ref 0.70–1.35)
Glucose, Bld: 87 mg/dL (ref 65–99)
Potassium: 4.8 mmol/L (ref 3.5–5.3)
Sodium: 140 mmol/L (ref 135–146)
eGFR: 83 mL/min/{1.73_m2} (ref >=60)
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
## 2021-10-24 ENCOUNTER — Encounter (HOSPITAL_COMMUNITY): Payer: Medicare Other

## 2021-10-24 ENCOUNTER — Ambulatory Visit (HOSPITAL_COMMUNITY): Payer: No Typology Code available for payment source

## 2021-10-24 ENCOUNTER — Encounter (HOSPITAL_COMMUNITY): Payer: Self-pay

## 2021-10-24 DIAGNOSIS — M25561 Pain in right knee: Secondary | ICD-10-CM | POA: Diagnosis not present

## 2021-10-24 DIAGNOSIS — M25661 Stiffness of right knee, not elsewhere classified: Secondary | ICD-10-CM | POA: Diagnosis not present

## 2021-10-24 DIAGNOSIS — R2689 Other abnormalities of gait and mobility: Secondary | ICD-10-CM
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
## 2021-10-27 ENCOUNTER — Ambulatory Visit (HOSPITAL_COMMUNITY): Payer: No Typology Code available for payment source | Admitting: Physical Therapy

## 2021-10-27 ENCOUNTER — Encounter (HOSPITAL_COMMUNITY): Payer: Self-pay | Admitting: Physical Therapy

## 2021-10-27 DIAGNOSIS — R2689 Other abnormalities of gait and mobility: Secondary | ICD-10-CM

## 2021-10-27 DIAGNOSIS — M25561 Pain in right knee: Secondary | ICD-10-CM

## 2021-10-27 DIAGNOSIS — M25661 Stiffness of right knee, not elsewhere classified: Secondary | ICD-10-CM
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
## 2021-10-29 ENCOUNTER — Encounter (HOSPITAL_COMMUNITY): Payer: Self-pay | Admitting: Physical Therapy

## 2021-10-29 ENCOUNTER — Ambulatory Visit (HOSPITAL_COMMUNITY): Payer: No Typology Code available for payment source | Admitting: Physical Therapy

## 2021-10-29 DIAGNOSIS — R2689 Other abnormalities of gait and mobility: Secondary | ICD-10-CM

## 2021-10-29 DIAGNOSIS — M25661 Stiffness of right knee, not elsewhere classified: Secondary | ICD-10-CM | POA: Diagnosis not present

## 2021-10-29 DIAGNOSIS — M25561 Pain in right knee: Secondary | ICD-10-CM
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
## 2021-10-31 ENCOUNTER — Encounter (HOSPITAL_COMMUNITY): Payer: Medicare Other

## 2021-12-06 NOTE — Addendum Note
Addended by: Alonna Buckler on: 12/06/2021 07:18 AM     Modules accepted: Orders

## 2021-12-10 ENCOUNTER — Other Ambulatory Visit: Payer: Self-pay | Admitting: Family Medicine

## 2021-12-11 NOTE — Telephone Encounter (Signed)
Requested medication (s) are due for refill today: yes  Requested medication (s) are on the active medication list: yes  Last refill:  10/06/21 and 07/16/20  Future visit scheduled:no  Notes to clinic:  Unable to refill per protocol, cannot delegate.      Requested Prescriptions  Pending Prescriptions Disp Refills   cyclobenzaprine (FLEXERIL) 5 MG tablet [Pharmacy Med Name: CYCLOBENZAPRINE '5MG'$  TABLETS] 60 tablet 2    Sig: TAKE 1 TABLET(5 MG) BY MOUTH THREE TIMES DAILY AS NEEDED FOR MUSCLE SPASMS     Not Delegated - Analgesics:  Muscle Relaxants Failed - 12/11/2021 12:57 PM      Failed - This refill cannot be delegated      Passed - Valid encounter within last 6 months    Recent Outpatient Visits           4 months ago Pure hypercholesterolemia   [place] Pickard, Cammie Mcgee, MD   6 months ago Current moderate episode of major depressive disorder without prior episode ([place])   [place] Pickard, Cammie Mcgee, MD   8 months ago Viral upper respiratory tract infection   [place] Dennard Schaumann, Cammie Mcgee, MD   10 months ago Acute pain of right knee   [place] Pickard, Cammie Mcgee, MD   10 months ago Need for immunization against influenza   [place], Cammie Mcgee, MD               LORazepam (ATIVAN) 0.5 MG tablet [Pharmacy Med Name: LORAZEPAM 0.'5MG'$  TABLETS] 45 tablet     Sig: TAKE 1 TABLET(0.5 MG) BY MOUTH TWICE DAILY AS NEEDED FOR ANXIETY     Not Delegated - Psychiatry: Anxiolytics/Hypnotics 2 Failed - 12/11/2021 12:57 PM      Failed - This refill cannot be delegated      Failed - Urine Drug Screen completed in last 360 days      Passed - Patient is not pregnant      Passed - Valid encounter within last 6 months    Recent Outpatient Visits           4 months ago Pure hypercholesterolemia   [place] Pickard, Cammie Mcgee, MD   6 months ago Current moderate episode of  major depressive disorder without prior episode ([place])   [place] Pickard, Cammie Mcgee, MD   8 months ago Viral upper respiratory tract infection   [place], Warren T, MD   10 months ago Acute pain of right knee   [place] Pickard, Cammie Mcgee, MD   10 months ago Need for immunization against influenza   [place], Cammie Mcgee, MD

## 2021-12-11 NOTE — Telephone Encounter (Signed)
Last RF on Cyclobenzaprine 10/16/2021 and Lorazepam 07/16/2020. Last office visit, 10/16/2021. [facility], [address]

## 2021-12-12 ENCOUNTER — Ambulatory Visit: Payer: BLUE CROSS/BLUE SHIELD

## 2021-12-21 MED ORDER — LOSARTAN POTASSIUM 50 MG PO TABS
50 mg | ORAL_TABLET | Freq: Every day | ORAL | 3 refills
Start: 2021-12-21 — End: ?

## 2021-12-22 MED ORDER — LOSARTAN POTASSIUM 50 MG PO TABS
50 mg | ORAL_TABLET | Freq: Every day | ORAL | 0 refills | Status: AC
Start: 2021-12-22 — End: ?

## 2022-01-05 DIAGNOSIS — D3132 Benign neoplasm of left choroid: Secondary | ICD-10-CM | POA: Diagnosis not present

## 2022-01-05 DIAGNOSIS — H35033 Hypertensive retinopathy, bilateral: Secondary | ICD-10-CM | POA: Diagnosis not present

## 2022-01-05 DIAGNOSIS — H2513 Age-related nuclear cataract, bilateral: Secondary | ICD-10-CM | POA: Diagnosis not present

## 2022-01-05 DIAGNOSIS — H43823 Vitreomacular adhesion, bilateral: Secondary | ICD-10-CM | POA: Diagnosis not present

## 2022-01-06 ENCOUNTER — Ambulatory Visit: Payer: No Typology Code available for payment source | Admitting: Family Medicine

## 2022-01-06 ENCOUNTER — Telehealth: Payer: Self-pay

## 2022-01-06 ENCOUNTER — Other Ambulatory Visit: Payer: Self-pay | Admitting: Family Medicine

## 2022-01-06 MED ORDER — NIRMATRELVIR/RITONAVIR (PAXLOVID)TABLET: 3.0000 | ORAL_TABLET | Freq: Two times a day (BID) | ORAL | 0 refills | Status: AC

## 2022-01-06 NOTE — Telephone Encounter (Signed)
Pt called in stating that he tested positive for Covid and is now running a fever. Pt asks if something could be called into his pharmacy. Pt would like a cb as well please.  Cb#: [phone]

## 2022-01-07 MED ORDER — JARDIANCE 25 MG PO TABS
ORAL_TABLET | 0 refills
Start: 2022-01-07 — End: ?

## 2022-01-08 MED ORDER — JARDIANCE 25 MG PO TABS
ORAL_TABLET | 0 refills | Status: AC
Start: 2022-01-08 — End: ?

## 2022-01-16 ENCOUNTER — Other Ambulatory Visit: Payer: Self-pay | Admitting: Family Medicine

## 2022-01-28 ENCOUNTER — Other Ambulatory Visit: Payer: Self-pay | Admitting: Family Medicine

## 2022-01-28 DIAGNOSIS — J309 Allergic rhinitis, unspecified: Secondary | ICD-10-CM

## 2022-01-29 NOTE — Telephone Encounter (Signed)
Requested Prescriptions  Pending Prescriptions Disp Refills  . buPROPion (WELLBUTRIN XL) 150 MG 24 hr tablet [Pharmacy Med Name: BUPROPION XL '150MG'$  TABLETS (24 H)] 90 tablet 2    Sig: TAKE 1 TABLET(150 MG) BY MOUTH DAILY     Psychiatry: Antidepressants - bupropion Passed - 01/28/2022  6:33 AM      Passed - Cr in normal range and within 360 days    Creat  Date Value Ref Range Status  10/21/2021 1.02 0.70 - 1.35 mg/dL Final         Passed - AST in normal range and within 360 days    AST  Date Value Ref Range Status  09/23/2021 32 15 - 41 U/L Final         Passed - ALT in normal range and within 360 days    ALT  Date Value Ref Range Status  09/23/2021 40 0 - 44 U/L Final         Passed - Last BP in normal range    BP Readings from Last 1 Encounters:  10/21/21 122/76         Passed - Valid encounter within last 6 months    Recent Outpatient Visits          5 months ago Pure hypercholesterolemia   [place] Dennard Schaumann, Cammie Mcgee, MD   8 months ago Current moderate episode of major depressive disorder without prior episode ([place])   [place] Pickard, Cammie Mcgee, MD   9 months ago Viral upper respiratory tract infection   [place] Denn[place] Schaumann, Cammie Mcgee, MD   11 months ago Acute pain of right knee   [place] Pickard, Cammie Mcgee, MD   12 months ago Need for immunization against influenza   [place] Pickard, Cammie Mcgee, MD             . loratadine (CLARITIN) 10 MG tablet [Pharmacy Med Name: LORATADINE '10MG'$  TABLETS] 90 tablet 0    Sig: TAKE 1 TABLET(10 MG) BY MOUTH DAILY     Ear, Nose, and Throat:  Antihistamines 2 Passed - 01/28/2022  6:33 AM      Passed - Cr in normal range and within 360 days    Creat  Date Value Ref Range Status  10/21/2021 1.02 0.70 - 1.35 mg/dL Final         Passed - Valid encounter within last 12 months    Recent Outpatient Visits          5 months ago Pure  hypercholesterolemia   [place] Pickard, Cammie Mcgee, MD   8 months ago Current moderate episode of major depressive disorder without prior episode ([place])   [place] Pickard, Cammie Mcgee, MD   9 months ago Viral upper respiratory tract infection   [place] Susy Frizzle, MD   11 months ago Acute pain of right knee   [place] Pickard, Cammie Mcgee, MD   12 months ago Need for immunization against influenza   [place], Cammie Mcgee, MD

## 2022-01-29 NOTE — Telephone Encounter (Signed)
Requested Prescriptions  Pending Prescriptions Disp Refills  . buPROPion (WELLBUTRIN XL) 150 MG 24 hr tablet [Pharmacy Med Name: BUPROPION XL '150MG'$  TABLETS (24 H)] 90 tablet 0    Sig: TAKE 1 TABLET(150 MG) BY MOUTH DAILY     Psychiatry: Antidepressants - bupropion Passed - 01/28/2022  6:33 AM      Passed - Cr in normal range and within 360 days    Creat  Date Value Ref Range Status  10/21/2021 1.02 0.70 - 1.35 mg/dL Final         Passed - AST in normal range and within 360 days    AST  Date Value Ref Range Status  09/23/2021 32 15 - 41 U/L Final         Passed - ALT in normal range and within 360 days    ALT  Date Value Ref Range Status  09/23/2021 40 0 - 44 U/L Final         Passed - Last BP in normal range    BP Readings from Last 1 Encounters:  10/21/21 122/76         Passed - Valid encounter within last 6 months    Recent Outpatient Visits          5 months ago Pure hypercholesterolemia   [place] Susy Frizzle, MD   8 months ago Current moderate episode of major depressive disorder without prior episode ([place])   [place] Pickard, Cammie Mcgee, MD   9 months ago Viral upper respiratory tract infection   [place] Dennard Schaumann, Cammie Mcgee, MD   11 months ago Acute pain of right knee   [place] Dennard Schaumann, Cammie Mcgee, MD   12 months ago Need for immunization against influenza   [place] Pickard, Cammie Mcgee, MD             Signed Prescriptions Disp Refills   loratadine (CLARITIN) 10 MG tablet 90 tablet 0    Sig: TAKE 1 TABLET(10 MG) BY MOUTH DAILY     Ear, Nose, and Throat:  Antihistamines 2 Passed - 01/28/2022  6:33 AM      Passed - Cr in normal range and within 360 days    Creat  Date Value Ref Range Status  10/21/2021 1.02 0.70 - 1.35 mg/dL Final         Passed - Valid encounter within last 12 months    Recent Outpatient Visits          5 months ago Pure  hypercholesterolemia   [place] Pickard, Cammie Mcgee, MD   8 months ago Current moderate episode of major depressive disorder without prior episode ([place])   [place] Pickard, Cammie Mcgee, MD   9 months ago Viral upper respiratory tract infection   [place] Susy Frizzle, MD   11 months ago Acute pain of right knee   [place] Pickard, Cammie Mcgee, MD   12 months ago Need for immunization against influenza   [place], Cammie Mcgee, MD

## 2022-02-09 ENCOUNTER — Ambulatory Visit: Payer: BLUE CROSS/BLUE SHIELD

## 2022-02-09 DIAGNOSIS — Z0000 Encounter for general adult medical examination without abnormal findings: Secondary | ICD-10-CM

## 2022-02-09 DIAGNOSIS — E119 Type 2 diabetes mellitus without complications: Secondary | ICD-10-CM

## 2022-02-09 DIAGNOSIS — Z0100 Encounter for examination of eyes and vision without abnormal findings: Secondary | ICD-10-CM

## 2022-02-09 DIAGNOSIS — Z1331 Encounter for screening for depression: Secondary | ICD-10-CM

## 2022-02-09 LAB — Comprehensive Metabolic Panel
ALBUMIN: 4.4 g/dL (ref 3.9–5.0)
ASPARTATE AMINOTRANSFERASE: 24 U/L (ref 13–62)

## 2022-02-09 LAB — CBC: HEMATOCRIT: 47.6 (ref 38.5–52.0)

## 2022-02-09 LAB — Differential Automated: LYMPHOCYTE PERCENT, AUTO: 28.2 (ref 0.00–0.04)

## 2022-02-09 MED ORDER — METFORMIN HCL ER 500 MG PO TB24
1000 mg | ORAL_TABLET | Freq: Two times a day (BID) | ORAL | 3 refills | Status: AC
Start: 2022-02-09 — End: ?

## 2022-02-09 MED ORDER — TRULICITY 0.75 MG/0.5ML SC SOPN
0.75 mg | SUBCUTANEOUS | 3 refills | 28.00000 days | Status: AC
Start: 2022-02-09 — End: 2022-02-10

## 2022-02-09 MED ORDER — LOSARTAN POTASSIUM 100 MG PO TABS
100 mg | ORAL_TABLET | Freq: Every day | ORAL | 3 refills | Status: AC
Start: 2022-02-09 — End: ?

## 2022-02-09 MED ORDER — LOSARTAN POTASSIUM 50 MG PO TABS
50 mg | ORAL_TABLET | Freq: Every day | ORAL | 3 refills | Status: AC
Start: 2022-02-09 — End: 2022-02-10

## 2022-02-09 MED ORDER — JARDIANCE 25 MG PO TABS
ORAL_TABLET | 3 refills | Status: AC
Start: 2022-02-09 — End: ?

## 2022-02-09 MED ORDER — ATORVASTATIN CALCIUM 80 MG PO TABS
80 mg | ORAL_TABLET | Freq: Every day | ORAL | 3 refills | Status: AC
Start: 2022-02-09 — End: ?

## 2022-02-09 MED ORDER — HYDROCHLOROTHIAZIDE 25 MG PO TABS
25 mg | ORAL_TABLET | Freq: Every day | ORAL | 3 refills | Status: AC
Start: 2022-02-09 — End: ?

## 2022-02-09 NOTE — Patient Instructions
Please go to the lab few days before your next visit

## 2022-02-09 NOTE — H&P
PATIENT: Keith Cabrera   MRN: [id number]  DOB: 1961-01-10  DATE OF SERVICE: 02/09/2022  CHIEF COMPLAINT:   Chief Complaint   Patient presents with   ? Annual Exam      HPI   Keith Cabrera is a 61 y.o. male presents for a complete physical exam    PMH     Patient Active Problem List   Diagnosis   ? Type 2 diabetes mellitus without complication (HCC/RAF)   ? History of coronary artery stent placement   ? Coronary artery disease involving native coronary artery without angina pectoris   ? Hypertension associated with diabetes (HCC/RAF)   ? Mixed hyperlipidemia   ? Ingrown toenail   ? History of colonoscopy with polypectomy   ? Smokes cigars   ? Hyperlipidemia associated with type 2 diabetes mellitus (HCC/RAF)     Past Medical History:   Diagnosis Date   ? Coronary artery disease    ? Diabetes mellitus, type 2 (HCC/RAF)    ? Hyperlipidemia    ? Hypertension      PSxH     Past Surgical History:   Procedure Laterality Date   ? COLONOSCOPY N/A 02/21/2016   ? CORONARY STENT PLACEMENT  03/13/13     ALL   No Known Allergies  MEDS     No outpatient medications have been marked as taking for the 02/09/22 encounter (Office Visit) with Erling Cruz., MD.     [facility] AND [facility]     Family History   Problem Relation Age of Onset   ? Alzheimer's disease Mother    ? Heart disease Father      Social History     Tobacco Use   ? Smoking status: Former   ? Smokeless tobacco: Never   ? Tobacco comments:     occ. cigar   Substance Use Topics   ? Alcohol use: Not on file       Immunizations:  Immunization History   Administered Date(s) Administered   ? COVID-19, mRNA, (Moderna) 100 mcg/0.5 mL 07/04/2019, 08/01/2019, 03/19/2020   ? COVID-19, mRNA, bivalent (Pfizer) 30 mcg/0.3 mL (12y and up) 03/14/2021   ? Influenza vaccine IM quadrivalent (Afluria Quad) (PF) SYR (16 years of age and older) 12/30/2021   ? Td 02/22/2018   ? Tdap 10/06/2006, 10/06/2006, 10/01/2009   ? influenza vaccine IM quadrivalent (Fluarix Quad) (PF) SYR (54 months of age and older) 12/11/2020   ? influenza vaccine IM quadrivalent (Fluzone Quad) MDV (47 months of age and older) 02/12/2014   ? influenza vaccine IM recombinant DNA quadrivalent (Flublok Quad ) (PF) SYR (52+ years of age) 02/12/2017   ? influenza, unspecified formulation 01/07/2011, 03/19/2015, 02/11/2016, 02/22/2018, 03/28/2019   ? pneumococcal conjugate vaccine 13-valent (Prevnar) 01/23/2008   ? pneumococcal conjugate vaccine 20-valent (Prevnar 20) 06/11/2021   ? pneumococcal polysaccharide vaccine 23-valent (Pneumovax) 01/23/2008   ? zoster vac recomb adjuvanted (Shingrix) 09/02/2017, 12/03/2017         HEALTH MAINTENANCE     Health Maintenance   Topic Date Due   ? Diabetes: EYE EXAM  02/21/2022   ? Diabetes: HGB A1C  06/06/2022   ? Annual Preventive Wellness Visit  02/10/2023   ? Diabetes: NEPHROPATHY MONITORING  02/10/2023   ? Colorectal Cancer Screening  02/21/2023   ? Tdap/Td Vaccine (5 - Td or Tdap) 02/23/2028   ? Hepatitis B Screening  Completed   ? Influenza Vaccine  Completed   ? Pneumococcal Vaccine  Completed   ?  Hepatitis C Screening  Completed   ? Shingles (Shingrix) Vaccine  Completed   ? COVID-19 Vaccine(Tracks primary and booster doses, not sup/immunocomp)  Completed   ? HIV Screening  Completed   ? Aspirin for Secondary Prevention  Completed   ? Statin prescribed for ASCVD Prevention or Treatment  Completed       PHQ-9 Results      09/02/2017     7:48 AM 06/23/2018     8:12 AM 09/07/2020     9:07 AM 02/09/2022     7:26 AM 02/09/2022     7:32 AM   Depression Screening (Patient Health Questionnaire PHQ)   PHQ-2: Feeling down, depressed, or hopeless No No No Not at all    PHQ-2: Little interest or pleassure in doing things No No No Not at all    Little interest or pleasure in doing things     Not at all   Feeling down, depressed, or hopeless     Not at all   Trouble falling or staying asleep, or sleeping too much     Several days   Feeling tired or having little energy     Several days   Poor appetite or overeating     Several days   Feeling bad about yourself - or that you are a failure or have let yourself or your family down     Not at all   Trouble concentrating on things, such as reading the newspaper or watching television     Not at all   Moving or speaking so slowly Or being so fidgety or restless     Not at all   Thoughts that you would be better off dead, or of hurting yourself in some way     Not at all   Total Score     3        GAD-7 Results      02/09/2022     7:32 AM   GAD-7   Feeling nervous, anxious, or on edge Several days   Not being able to stop or control worrying Not at all   Worrying too much about different things Not at all   Trouble relaxing Not at all   Being so restless that is hard to sit still Not at all   Becoming easily annoyed or irritable Several days   Feeling afraid, as if something awful might happen Not at all   Total Score 2       DAST-10 Results      02/09/2022     7:32 AM   DAST-10   In the last year, have you used drugs other than those required for medical reasons Yes   In the last year, do you abuse more than one drug at a time? Yes   In the last year, are you always able to stop using drugs when you want to? Yes   In the last year, have you had ?blackouts? or ?flashbacks? as a result of drug use? No   In the last year, do you ever feel bad or guilty about your drug use? No   In the last year, does your spouse (or parent) ever complain about your involvement with drugs? No   In the last year, have you neglected your family because of your use of drugs? No   In the last year, have you engaged in illegal activities in order to obtain drugs? No   In the last year, have you  ever experienced withdrawal symptoms (felt sick) when you stopped taking drugs? No   In the last year, have you had medical problems as a result of your drug use (e.g., memory loss, hepatitis, convulsions, bleeding, etc.)? No   Total Score 2       AUDIT-C Results      02/09/2022     7:32 AM   AUDIT-C   In the past year, how often did you have a drink containing alcohol? 2-4 times per month   In the past year, how many drinks containing alcohol did you have on a typical day when you were drinking? 7 to 9   In the past year, how often did you have six or more drinks on one occasion? Weekly   Total Score 8           PHYSICAL EXAM       BP 143/74  ~ Pulse 67  ~ Temp 36.5 ?C (97.7 ?F) (Tympanic)  ~ Resp 15  ~ Ht 5' 11'' (1.803 m)  ~ Wt 222 lb (100.7 kg)  ~ BMI 30.96 kg/m?       Constitutional:  Appearance:  Well nourished, well developed, in no distress  Head:  Normocephalic, atraumatic  Ears:  External ear canals normal, tympanic membranes normal  Neck:  Supple, no lymphadenopathy, no bruits, thyroid gland normal  Respiratory:  Non labored breathing, clear to auscultation, no rales, ronchi or wheezes  Cardiovascular:  Heart:  Regular rhythm, heart sounds normal, no murmer, no gallop  Gastrointestinal:  No tenderness, no guarding, no hepatosplenomegaly, no masses,                               Normal bowel sounds, no vascular bruits  Genitourinary:  Genitourinary Exam: no hernia, no penile lesions, no testicular mass                                   Lymphatic:  No lymphadenopathy  Musculoskeletal:  No muscle weakness, no muscle tenderness  Skin:  No significant lesions seen, no rash  Neurologic:  Mental status without obvious abnormalities  Psychiatric:  Normal mood, normal affect                           A&P   1. Routine general medical examination at a health care facility  - Comprehensive Metabolic Panel; Future  - CBC & Auto Differential; Future  - CBC & Auto Differential  - Comprehensive Metabolic Panel    2. Type 2 diabetes mellitus without complication, without long-term current use of insulin (HCC/RAF)  - stable  - metFORMIN (GLUCOPHAGE XR) 500 mg PO ER 24 hr tablet; Take 2 tablets (1,000 mg total) by mouth two (2) times daily.  Dispense: 360 tablet; Refill: 3  - dulaglutide (TRULICITY) 0.75 mg/0.5 mL pen; Inject 0.5 mLs (0.75 mg total) under the skin every seven (7) days.  Dispense: 6 mL; Refill: 3  - empagliflozin (JARDIANCE) 25 mg tablet; TAKE 1 TABLET(25 MG) BY MOUTH EVERY MORNING.  Dispense: 90 tablet; Refill: 3  - Hgb A1c; Future  - Hgb A1c    3. Hypertension associated with diabetes (HCC/RAF)  - not at goal   - continue hydroCHLOROthiazide 25 mg tablet; Take 1 tablet (25 mg total) by mouth daily.  Dispense: 90 tablet; Refill: 3  -  increase from 50 mg to losartan 100 mg tablet; Take 1 tablet (100 mg total) by mouth daily.  Dispense: 90 tablet; Refill: 3    4. Hyperlipidemia associated with type 2 diabetes mellitus (HCC/RAF)  - atorvastatin 80 mg tablet; Take 1 tablet (80 mg total) by mouth daily.  Dispense: 90 tablet; Refill: 3    5. Depression screening  - TBOC - [facility] Screeners Completed x 4    6. Diabetic eye exam (HCC/RAF)  - [facility] TIHN Referral to Ophthalmology        Patient Instructions   Please go to the lab few days before your next visit          The above recommendation were discussed with the patient.  The patient has all questions answered satisfactorily and is in agreement with this recommended plan of care.    Author:  Erling Cruz, MD 9:02 AM    Future Appointments       Provider Department Visit Type    05/15/2022 8:00 AM Caoilainn Sacks, Wylie Hail., MD [facility] MPTF [facility] RETURN

## 2022-02-10 ENCOUNTER — Ambulatory Visit: Payer: BLUE CROSS/BLUE SHIELD

## 2022-02-10 LAB — Hgb A1c: HGB A1C - HPLC: 6.7 — ABNORMAL HIGH (ref <5.7)

## 2022-02-10 MED ORDER — TRULICITY 1.5 MG/0.5ML SC SOPN
1.5 mg | SUBCUTANEOUS | 3 refills | 28.00000 days | Status: AC
Start: 2022-02-10 — End: ?

## 2022-02-18 ENCOUNTER — Encounter: Payer: Self-pay | Admitting: Family Medicine

## 2022-02-18 ENCOUNTER — Other Ambulatory Visit: Payer: Self-pay | Admitting: Family Medicine

## 2022-02-18 ENCOUNTER — Telehealth: Payer: Self-pay

## 2022-02-18 ENCOUNTER — Other Ambulatory Visit: Payer: Self-pay

## 2022-02-18 DIAGNOSIS — F411 Generalized anxiety disorder: Secondary | ICD-10-CM

## 2022-02-18 MED ORDER — VENLAFAXINE HCL ER 75 MG PO CP24: ORAL_CAPSULE | ORAL | 3 refills | Status: DC

## 2022-02-18 NOTE — Telephone Encounter (Signed)
Pt is in need of a refill on his Cyclobenzaprine. Last RF was 12/11/2021. Last OV was 10/21/2021. [facility] uses [facility] [address], [address]. Thank you.

## 2022-02-19 ENCOUNTER — Other Ambulatory Visit: Payer: Self-pay | Admitting: Family Medicine

## 2022-02-19 MED ORDER — CYCLOBENZAPRINE HCL 5 MG PO TABS: ORAL_TABLET | ORAL | 2 refills | Status: DC

## 2022-02-28 ENCOUNTER — Inpatient Hospital Stay: Payer: BLUE CROSS/BLUE SHIELD | Attending: Student in an Organized Health Care Education/Training Program

## 2022-02-28 ENCOUNTER — Ambulatory Visit: Payer: BLUE CROSS/BLUE SHIELD | Attending: Student in an Organized Health Care Education/Training Program

## 2022-02-28 DIAGNOSIS — M25511 Pain in right shoulder: Secondary | ICD-10-CM

## 2022-02-28 MED ORDER — DICLOFENAC SODIUM 1 % EX GEL
4 g | Freq: Four times a day (QID) | TOPICAL | 0 refills | Status: AC
Start: 2022-02-28 — End: ?

## 2022-02-28 MED ORDER — NAPROXEN 500 MG PO TABS
500 mg | ORAL_TABLET | Freq: Two times a day (BID) | ORAL | 0 refills | Status: AC
Start: 2022-02-28 — End: ?

## 2022-02-28 NOTE — Progress Notes
PATIENT: Keith Cabrera  MRN: [id number]  DOB: 02-Jul-1960  DATE OF SERVICE: 02/28/2022    CHIEF COMPLAINT:   Chief Complaint   Patient presents with   ? Arm Pain     Right arm pain, pt fell 2 weeks ago        Subjective:      Keith Cabrera is a 61 y.o. male with diabetes and HTN now presenting to the IC with complaints of right arm pain, for over 10, improving, notices with particular movements, after accidentally over stretching 2 weeks ago.  He is right-handed.  Denies weakness, tingling or numbness.  Pain improves with massage, resting, icing and analgesics he has been using as needed.  Otherwise, denies any head or other part of the body injury, LOC, no blurry vision, dizziness, ear pain, no cough, palpitation/CP, no abdominal pain or recent changes in urine or bowel movements.    Review of Systems   A complete 14 point ROS conducted, negative except as per HPI.      Objective:      Physical Exam   Last Recorded Vital Signs:    02/28/22 0909   BP: 136/80   Pulse: 67   Resp: 16   Temp: 37 ?C (98.6 ?F)   SpO2: 96%     GRAL: Non in distress  HEENT: Conjunctiva moist and normal colored  LUNGS: CTA  CV: RRR, no m/r/g  MSK: RUE - hands with mild OA, wrist and elbow w/o ttp, no effusion, rom wnl. Shoulder without deformity, no tenderness or effusion, no lassitude or weakness, mild pain on extension over AC area.  NEURO: No focal deficit   PSYCH: Calm, cooperative, affect and mood appropiates        Assessment:       1. Acute pain of right shoulder  Posttraumatic, not severe or complicated pain, impress tendonitis versus AC joint arthritis in the setting of OA.  X-ray interpreted by me without concerns of luxation or fracture.  Patient advised to continue RICE, topical and oral analgesics p.r.n..  Recommended to follow-up results of final radiologic report in patient's portal.    - XR shoulder ap int-ext right (2 views); Future  - naproxen 500 mg tablet; Take 1 tablet (500 mg total) by mouth two (2) times daily with meals.  Dispense: 100 tablet; Refill: 0  - diclofenac Sodium 1% gel; Apply 4 g topically four (4) times daily.  Dispense: 300 g; Refill: 0        Plan:       Patient was reassured about findings and expectations as well as plan for management and prognosis at this time.   Advised to continue monitoring for signs or symptoms (as explained in clinic) of worsening and complications. ED and RTC precautions given. Recommended to follow up with PCP In 3-5 days or as needed. The above diagnosis, orders and following were discussed with the patient. The patient had all questions answered satisfactorily and understands this recommended plan of care.This note has been completed with the assistance of voice recognition HIPAA-regulated and encrypted dictation service.  Please communicate to our office any concerns regarding the documented information.          Author:  [place] Denner Su Cabrera 02/28/2022 9:13 AM

## 2022-03-03 ENCOUNTER — Other Ambulatory Visit: Payer: Self-pay | Admitting: Family Medicine

## 2022-04-09 ENCOUNTER — Other Ambulatory Visit: Payer: Self-pay | Admitting: Family Medicine

## 2022-04-10 NOTE — Telephone Encounter (Signed)
Requested medication (s) are due for refill today:   Provider to review  Requested medication (s) are on the active medication list:   Yes  Future visit scheduled:   No   Last ordered: 01/16/2022 #30, 2 refills  Returned because it's a non delegated refill.      Requested Prescriptions  Pending Prescriptions Disp Refills   zolpidem (AMBIEN) 10 MG tablet [Pharmacy Med Name: ZOLPIDEM '10MG'$  TABLETS] 30 tablet     Sig: TAKE 1 TABLET(10 MG) BY MOUTH AT BEDTIME     Not Delegated - Psychiatry:  Anxiolytics/Hypnotics Failed - 04/09/2022  6:13 PM      Failed - This refill cannot be delegated      Failed - Urine Drug Screen completed in last 360 days      Failed - Valid encounter within last 6 months    Recent Outpatient Visits           8 months ago Pure hypercholesterolemia   [place] Susy Frizzle, MD   10 months ago Current moderate episode of major depressive disorder without prior episode ([place])   [place] Pickard, Cammie Mcgee, MD   1 year ago Viral upper respiratory tract infection   [place] Susy Frizzle, MD   1 year ago Acute pain of right knee   [place], Cammie Mcgee, MD   1 year ago Need for immunization against influenza   [place] Pickard, Cammie Mcgee, MD

## 2022-04-17 ENCOUNTER — Encounter: Payer: Self-pay | Admitting: Family Medicine

## 2022-04-22 ENCOUNTER — Other Ambulatory Visit: Payer: Self-pay | Admitting: Family Medicine

## 2022-04-22 NOTE — Telephone Encounter (Signed)
Requested medication (s) are due for refill today - yes  Requested medication (s) are on the active medication list -yes  Future visit scheduled -no  Last refill: 02/19/22 #60 2RF  Notes to clinic: non delegated Rx  Requested Prescriptions  Pending Prescriptions Disp Refills   cyclobenzaprine (FLEXERIL) 5 MG tablet [Pharmacy Med Name: CYCLOBENZAPRINE '5MG'$  TABLETS] 60 tablet 2    Sig: TAKE 1 TABLET(5 MG) BY MOUTH THREE TIMES DAILY AS NEEDED FOR MUSCLE SPASMS     Not Delegated - Analgesics:  Muscle Relaxants Failed - 04/22/2022  2:31 PM      Failed - This refill cannot be delegated      Failed - Valid encounter within last 6 months    Recent Outpatient Visits           8 months ago Pure hypercholesterolemia   [place] Susy Frizzle, MD   11 months ago Current moderate episode of major depressive disorder without prior episode ([place])   [place] Pickard, Cammie Mcgee, MD   1 year ago Viral upper respiratory tract infection   [place] Pickard, Cammie Mcgee, MD   1 year ago Acute pain of right knee   [place] Pickard, Cammie Mcgee, MD   1 year ago Need for immunization against influenza   [place] Pickard, Cammie Mcgee, MD                 Requested Prescriptions  Pending Prescriptions Disp Refills   cyclobenzaprine (FLEXERIL) 5 MG tablet [Pharmacy Med Name: CYCLOBENZAPRINE '5MG'$  TABLETS] 60 tablet 2    Sig: TAKE 1 TABLET(5 MG) BY MOUTH THREE TIMES DAILY AS NEEDED FOR MUSCLE SPASMS     Not Delegated - Analgesics:  Muscle Relaxants Failed - 04/22/2022  2:31 PM      Failed - This refill cannot be delegated      Failed - Valid encounter within last 6 months    Recent Outpatient Visits           8 months ago Pure hypercholesterolemia   [place] Susy Frizzle, MD   11 months ago Current moderate episode of major depressive disorder without prior episode ([place])    [place] Pickard, Cammie Mcgee, MD   1 year ago Viral upper respiratory tract infection   [place] Pickard, Cammie Mcgee, MD   1 year ago Acute pain of right knee   [place], Cammie Mcgee, MD   1 year ago Need for immunization against influenza   [place] Pickard, Cammie Mcgee, MD

## 2022-04-28 ENCOUNTER — Other Ambulatory Visit: Payer: Self-pay | Admitting: Family Medicine

## 2022-04-28 DIAGNOSIS — H43823 Vitreomacular adhesion, bilateral: Secondary | ICD-10-CM | POA: Diagnosis not present

## 2022-04-28 DIAGNOSIS — D3132 Benign neoplasm of left choroid: Secondary | ICD-10-CM | POA: Diagnosis not present

## 2022-04-28 DIAGNOSIS — J309 Allergic rhinitis, unspecified: Secondary | ICD-10-CM

## 2022-04-28 DIAGNOSIS — H35033 Hypertensive retinopathy, bilateral: Secondary | ICD-10-CM | POA: Diagnosis not present

## 2022-04-28 DIAGNOSIS — H2513 Age-related nuclear cataract, bilateral: Secondary | ICD-10-CM | POA: Diagnosis not present

## 2022-04-28 NOTE — Telephone Encounter (Signed)
Wellbutrin XL refill: last RF 01/26/22 #90   RF due     FVS: no  Called pt and LM on Vm to call office to schedule appt for med refill.   Requested Prescriptions  Pending Prescriptions Disp Refills   buPROPion (WELLBUTRIN XL) 150 MG 24 hr tablet [Pharmacy Med Name: BUPROPION XL '150MG'$  TABLETS (24 H)] 90 tablet     Sig: TAKE 1 TABLET(150 MG) BY MOUTH DAILY     Psychiatry: Antidepressants - bupropion Failed - 04/28/2022  6:37 AM      Failed - Valid encounter within last 6 months    Recent Outpatient Visits           8 months ago Pure hypercholesterolemia   [place] Pickard, Cammie Mcgee, MD   11 months ago Current moderate episode of major depressive disorder without prior episode ([place])   [place] Pickard, Cammie Mcgee, MD   1 year ago Viral upper respiratory tract infection   [place] Susy Frizzle, MD   1 year ago Acute pain of right knee   [place] Susy Frizzle, MD   1 year ago Need for immunization against influenza   [place] Pickard, Cammie Mcgee, MD              Passed - Cr in normal range and within 360 days    Creat  Date Value Ref Range Status  10/21/2021 1.02 0.70 - 1.35 mg/dL Final         Passed - AST in normal range and within 360 days    AST  Date Value Ref Range Status  09/23/2021 32 15 - 41 U/L Final         Passed - ALT in normal range and within 360 days    ALT  Date Value Ref Range Status  09/23/2021 40 0 - 44 U/L Final         Passed - Last BP in normal range    BP Readings from Last 1 Encounters:  10/21/21 122/76         Signed Prescriptions Disp Refills   loratadine (CLARITIN) 10 MG tablet 90 tablet 0    Sig: TAKE 1 TABLET(10 MG) BY MOUTH DAILY     Ear, Nose, and Throat:  Antihistamines 2 Passed - 04/28/2022  6:37 AM      Passed - Cr in normal range and within 360 days    Creat  Date Value Ref Range Status  10/21/2021 1.02 0.70 - 1.35 mg/dL  Final         Passed - Valid encounter within last 12 months    Recent Outpatient Visits           8 months ago Pure hypercholesterolemia   [place] Susy Frizzle, MD   11 months ago Current moderate episode of major depressive disorder without prior episode ([place])   [place] Pickard, Cammie Mcgee, MD   1 year ago Viral upper respiratory tract infection   [place] Pickard, Cammie Mcgee, MD   1 year ago Acute pain of right knee   [place] Pickard, Cammie Mcgee, MD   1 year ago Need for immunization against influenza   [place], Cammie Mcgee, MD               ezetimibe (ZETIA) 10 MG tablet 90 tablet 0  Sig: TAKE 1 TABLET(10 MG) BY MOUTH DAILY     Cardiovascular:  Antilipid - Sterol Transport Inhibitors Failed - 04/28/2022  6:37 AM      Failed - Lipid Panel in normal range within the last 12 months    Cholesterol, Total  Date Value Ref Range Status  12/22/2017 163 100 - 199 mg/dL Final   Cholesterol  Date Value Ref Range Status  08/04/2021 174 <200 mg/dL Final   LDL Cholesterol (Calc)  Date Value Ref Range Status  08/04/2021 101 (H) mg/dL (calc) Final    Comment:    Reference range: <100 . Desirable range <100 mg/dL for primary prevention;   <70 mg/dL for patients with CHD or diabetic patients  with > or = 2 CHD risk factors. [name] Kitchen LDL-C is now calculated using the Martin-Hopkins  calculation, which is a validated novel method providing  better accuracy than the Friedewald equation in the  estimation of LDL-C.  Cresenciano Genre et al. Annamaria Helling. 7035;009(38): 2061-2068  (http://education.[facility].com/faq/FAQ164)    HDL  Date Value Ref Range Status  08/04/2021 43 > OR = 40 mg/dL Final  12/22/2017 32 (L) >39 mg/dL Final   Triglycerides  Date Value Ref Range Status  08/04/2021 182 (H) <150 mg/dL Final         Passed - AST in normal range and within 360 days    AST   Date Value Ref Range Status  09/23/2021 32 15 - 41 U/L Final         Passed - ALT in normal range and within 360 days    ALT  Date Value Ref Range Status  09/23/2021 40 0 - 44 U/L Final         Passed - Patient is not pregnant      Passed - Valid encounter within last 12 months    Recent Outpatient Visits           8 months ago Pure hypercholesterolemia   [place] Pickard, Cammie Mcgee, MD   11 months ago Current moderate episode of major depressive disorder without prior episode ([place])   [place] Pickard, Cammie Mcgee, MD   1 year ago Viral upper respiratory tract infection   [place] Pickard, Cammie Mcgee, MD   1 year ago Acute pain of right knee   [place], Cammie Mcgee, MD   1 year ago Need for immunization against influenza   [place], Cammie Mcgee, MD

## 2022-04-28 NOTE — Telephone Encounter (Signed)
Requested Prescriptions  Pending Prescriptions Disp Refills   loratadine (CLARITIN) 10 MG tablet [Pharmacy Med Name: LORATADINE '10MG'$  TABLETS] 90 tablet 0    Sig: TAKE 1 TABLET(10 MG) BY MOUTH DAILY     Ear, Nose, and Throat:  Antihistamines 2 Passed - 04/28/2022  6:37 AM      Passed - Cr in normal range and within 360 days    Creat  Date Value Ref Range Status  10/21/2021 1.02 0.70 - 1.35 mg/dL Final         Passed - Valid encounter within last 12 months    Recent Outpatient Visits           8 months ago Pure hypercholesterolemia   [place] Pickard, Cammie Mcgee, MD   11 months ago Current moderate episode of major depressive disorder without prior episode ([place])   [place] Pickard, Cammie Mcgee, MD   1 year ago Viral upper respiratory tract infection   [place] Susy Frizzle, MD   1 year ago Acute pain of right knee   [place] Susy Frizzle, MD   1 year ago Need for immunization against influenza   [place], Cammie Mcgee, MD               buPROPion (WELLBUTRIN XL) 150 MG 24 hr tablet [Pharmacy Med Name: BUPROPION XL '150MG'$  TABLETS (24 H)] 90 tablet     Sig: TAKE 1 TABLET(150 MG) BY MOUTH DAILY     Psychiatry: Antidepressants - bupropion Failed - 04/28/2022  6:37 AM      Failed - Valid encounter within last 6 months    Recent Outpatient Visits           8 months ago Pure hypercholesterolemia   [place] Pickard, Cammie Mcgee, MD   11 months ago Current moderate episode of major depressive disorder without prior episode ([place])   [place] Pickard, Cammie Mcgee, MD   1 year ago Viral upper respiratory tract infection   [place] Pickard, Cammie Mcgee, MD   1 year ago Acute pain of right knee   [place] Susy Frizzle, MD   1 year ago Need for immunization against influenza   [place], Cammie Mcgee, MD              Passed - Cr in normal range and within 360 days    Creat  Date Value Ref Range Status  10/21/2021 1.02 0.70 - 1.35 mg/dL Final         Passed - AST in normal range and within 360 days    AST  Date Value Ref Range Status  09/23/2021 32 15 - 41 U/L Final         Passed - ALT in normal range and within 360 days    ALT  Date Value Ref Range Status  09/23/2021 40 0 - 44 U/L Final         Passed - Last BP in normal range    BP Readings from Last 1 Encounters:  10/21/21 122/76          ezetimibe (ZETIA) 10 MG tablet [Pharmacy Med Name: EZETIMIBE '10MG'$  TABLETS] 90 tablet 0    Sig: TAKE 1 TABLET(10 MG) BY MOUTH DAILY     Cardiovascular:  Antilipid - Sterol Transport Inhibitors Failed - 04/28/2022  6:37 AM  Failed - Lipid Panel in normal range within the last 12 months    Cholesterol, Total  Date Value Ref Range Status  12/22/2017 163 100 - 199 mg/dL Final   Cholesterol  Date Value Ref Range Status  08/04/2021 174 <200 mg/dL Final   LDL Cholesterol (Calc)  Date Value Ref Range Status  08/04/2021 101 (H) mg/dL (calc) Final    Comment:    Reference range: <100 . Desirable range <100 mg/dL for primary prevention;   <70 mg/dL for patients with CHD or diabetic patients  with > or = 2 CHD risk factors. [name] Kitchen LDL-C is now calculated using the Martin-Hopkins  calculation, which is a validated novel method providing  better accuracy than the Friedewald equation in the  estimation of LDL-C.  Cresenciano Genre et al. Annamaria Helling. 9381;829(93): 2061-2068  (http://education.[facility].com/faq/FAQ164)    HDL  Date Value Ref Range Status  08/04/2021 43 > OR = 40 mg/dL Final  12/22/2017 32 (L) >39 mg/dL Final   Triglycerides  Date Value Ref Range Status  08/04/2021 182 (H) <150 mg/dL Final         Passed - AST in normal range and within 360 days    AST  Date Value Ref Range Status  09/23/2021 32 15 - 41 U/L Final         Passed - ALT in  normal range and within 360 days    ALT  Date Value Ref Range Status  09/23/2021 40 0 - 44 U/L Final         Passed - Patient is not pregnant      Passed - Valid encounter within last 12 months    Recent Outpatient Visits           8 months ago Pure hypercholesterolemia   [place] Pickard, Cammie Mcgee, MD   11 months ago Current moderate episode of major depressive disorder without prior episode ([place])   [place] Pickard, Cammie Mcgee, MD   1 year ago Viral upper respiratory tract infection   [place] Pickard, Cammie Mcgee, MD   1 year ago Acute pain of right knee   [place], Cammie Mcgee, MD   1 year ago Need for immunization against influenza   [place], Cammie Mcgee, MD

## 2022-04-29 ENCOUNTER — Other Ambulatory Visit: Payer: Self-pay | Admitting: Family Medicine

## 2022-05-01 ENCOUNTER — Other Ambulatory Visit: Payer: Self-pay | Admitting: Family Medicine

## 2022-05-01 ENCOUNTER — Telehealth: Payer: Self-pay

## 2022-05-01 MED ORDER — CYCLOBENZAPRINE HCL 5 MG PO TABS: 5.0000 mg | ORAL_TABLET | Freq: Three times a day (TID) | ORAL | 0 refills | Status: DC | PRN

## 2022-05-01 NOTE — Telephone Encounter (Signed)
Pt called and states he needs a refill sent in for his Cyclobenzaprine 5 mg. Pt states he takes 1 three times per day. Pt states [facility] advised that Rx was denied. Does he need to make an appointment before it can be refilled? Thank you.

## 2022-05-04 ENCOUNTER — Telehealth: Payer: BLUE CROSS/BLUE SHIELD

## 2022-05-04 DIAGNOSIS — E119 Type 2 diabetes mellitus without complications: Secondary | ICD-10-CM

## 2022-05-04 NOTE — Telephone Encounter
Patient informed; he will walk-in to [facility] on [address].

## 2022-05-04 NOTE — Telephone Encounter
Call Back Request      Reason for call back: pt. States that he is supposed to have repeat labs this month from orders that were written on 11.06.23. no orders in the system.  Please advise.     Any Symptoms:  []  Yes  [x]  No      If yes, what symptoms are you experiencing:    Duration of symptoms (how long):    Have you taken medication for symptoms (OTC or Rx):      If call was taken outside of clinic hours:    [] Patient or caller has been notified that this message was sent outside of normal clinic hours.     [] Patient or caller has been warm transferred to the physician's answering service. If applicable, patient or caller informed to please call us back if symptoms progress.  Patient or caller has been notified of the turnaround time of 1-2 business day(s).

## 2022-05-04 NOTE — Telephone Encounter
Done

## 2022-05-07 ENCOUNTER — Telehealth: Payer: BLUE CROSS/BLUE SHIELD

## 2022-05-08 NOTE — Telephone Encounter
Left VM to give [place] a call back to r/s his appt with Dr. Juanell Fairly

## 2022-05-15 ENCOUNTER — Ambulatory Visit: Payer: BLUE CROSS/BLUE SHIELD

## 2022-05-15 DIAGNOSIS — E119 Type 2 diabetes mellitus without complications: Secondary | ICD-10-CM

## 2022-05-15 DIAGNOSIS — I2510 Atherosclerotic heart disease of native coronary artery without angina pectoris: Secondary | ICD-10-CM

## 2022-05-15 NOTE — Progress Notes
PATIENT: Keith Cabrera   MRN: [id number]  DOB: 21-Jul-1960  DATE OF SERVICE: 02/09/2022  CHIEF COMPLAINT:   Chief Complaint   Patient presents with   F/u       HPI   [name] is a 62 y.o. male presents f/u    DM2:increased trulicity to 1.5, tolerates well, fasting AM bs's ~120, taking meds regularly    Diet/exercise: eats sweets a lot, eats fiber cereal.     HTN: does not check at home, tolerating increase from 50 to 100mg    No cp/doe, syncope    HLD: takes statin daily,  tolerates well.     PMH     Patient Active Problem List   Diagnosis    Type 2 diabetes mellitus without complication (HCC/RAF)    History of coronary artery stent placement    Coronary artery disease involving native coronary artery without angina pectoris    Hypertension associated with diabetes (HCC/RAF)    Mixed hyperlipidemia    Ingrown toenail    History of colonoscopy with polypectomy    Smokes cigars    Hyperlipidemia associated with type 2 diabetes mellitus (HCC/RAF)     Past Medical History:   Diagnosis Date    Coronary artery disease     Diabetes mellitus, type 2 (HCC/RAF)     Hyperlipidemia     Hypertension      PSxH     Past Surgical History:   Procedure Laterality Date    COLONOSCOPY N/A 02/21/2016    CORONARY STENT PLACEMENT  03/13/13     ALL   No Known Allergies  MEDS     No outpatient medications have been marked as taking for the 02/09/22 encounter (Office Visit) with Erling Cruz., MD.     [facility] AND [facility]     Family History   Problem Relation Age of Onset    Alzheimer's disease Mother     Heart disease Father      Social History     Tobacco Use    Smoking status: Former    Smokeless tobacco: Never    Tobacco comments:     occ. cigar   Substance Use Topics    Alcohol use: Not on file       Immunizations:  Immunization History   Administered Date(s) Administered    COVID-19, mRNA, (Moderna) 100 mcg/0.5 mL 07/04/2019, 08/01/2019, 03/19/2020    COVID-19, mRNA, bivalent (Pfizer) 30 mcg/0.3 mL (12y and up) 03/14/2021 Influenza vaccine IM quadrivalent (Afluria Quad) (PF) SYR (75 years of age and older) 12/30/2021    Td 02/22/2018    Tdap 10/06/2006, 10/06/2006, 10/01/2009    influenza vaccine IM quadrivalent (Fluarix Quad) (PF) SYR (6 months of age and older) 12/11/2020    influenza vaccine IM quadrivalent (Fluzone Quad) MDV (75 months of age and older) 02/12/2014    influenza vaccine IM recombinant DNA quadrivalent (Flublok Quad ) (PF) SYR (55+ years of age) 02/12/2017    influenza, unspecified formulation 01/07/2011, 03/19/2015, 02/11/2016, 02/22/2018, 03/28/2019    pneumococcal conjugate vaccine 13-valent (Prevnar) 01/23/2008    pneumococcal conjugate vaccine 20-valent (Prevnar 20) 06/11/2021    pneumococcal polysaccharide vaccine 23-valent (Pneumovax) 01/23/2008    zoster vac recomb adjuvanted (Shingrix) 09/02/2017, 12/03/2017         HEALTH MAINTENANCE     Health Maintenance   Topic Date Due    Diabetes: EYE EXAM  02/21/2022    Diabetes: HGB A1C  06/06/2022    Annual Preventive Wellness Visit  02/10/2023  Diabetes: NEPHROPATHY MONITORING  02/10/2023    Colorectal Cancer Screening  02/21/2023    Tdap/Td Vaccine (5 - Td or Tdap) 02/23/2028    Hepatitis B Screening  Completed    Influenza Vaccine  Completed    Pneumococcal Vaccine  Completed    Hepatitis C Screening  Completed    Shingles (Shingrix) Vaccine  Completed    COVID-19 Vaccine(Tracks primary and booster doses, not sup/immunocomp)  Completed    HIV Screening  Completed    Aspirin for Secondary Prevention  Completed    Statin prescribed for ASCVD Prevention or Treatment  Completed       PHQ-9 Results      09/02/2017     7:48 AM 06/23/2018     8:12 AM 09/07/2020     9:07 AM 02/09/2022     7:26 AM 02/09/2022     7:32 AM   Depression Screening (Patient Health Questionnaire PHQ)   PHQ-2: Feeling down, depressed, or hopeless No No No Not at all    PHQ-2: Little interest or pleassure in doing things No No No Not at all    Little interest or pleasure in doing things     Not at all Feeling down, depressed, or hopeless     Not at all   Trouble falling or staying asleep, or sleeping too much     Several days   Feeling tired or having little energy     Several days   Poor appetite or overeating     Several days   Feeling bad about yourself - or that you are a failure or have let yourself or your family down     Not at all   Trouble concentrating on things, such as reading the newspaper or watching television     Not at all   Moving or speaking so slowly Or being so fidgety or restless     Not at all   Thoughts that you would be better off dead, or of hurting yourself in some way     Not at all   Total Score     3        GAD-7 Results      02/09/2022     7:32 AM   GAD-7   Feeling nervous, anxious, or on edge Several days   Not being able to stop or control worrying Not at all   Worrying too much about different things Not at all   Trouble relaxing Not at all   Being so restless that is hard to sit still Not at all   Becoming easily annoyed or irritable Several days   Feeling afraid, as if something awful might happen Not at all   Total Score 2       DAST-10 Results      02/09/2022     7:32 AM   DAST-10   In the last year, have you used drugs other than those required for medical reasons Yes   In the last year, do you abuse more than one drug at a time? Yes   In the last year, are you always able to stop using drugs when you want to? Yes   In the last year, have you had ?blackouts? or ?flashbacks? as a result of drug use? No   In the last year, do you ever feel bad or guilty about your drug use? No   In the last year, does your spouse (or parent) ever complain about your involvement  with drugs? No   In the last year, have you neglected your family because of your use of drugs? No   In the last year, have you engaged in illegal activities in order to obtain drugs? No   In the last year, have you ever experienced withdrawal symptoms (felt sick) when you stopped taking drugs? No   In the last year, have you had medical problems as a result of your drug use (e.g., memory loss, hepatitis, convulsions, bleeding, etc.)? No   Total Score 2       AUDIT-C Results      02/09/2022     7:32 AM   AUDIT-C   In the past year, how often did you have a drink containing alcohol? 2-4 times per month   In the past year, how many drinks containing alcohol did you have on a typical day when you were drinking? 7 to 9   In the past year, how often did you have six or more drinks on one occasion? Weekly   Total Score 8           PHYSICAL EXAM         Last Recorded Vital Signs:    05/15/22 0931   BP: 126/78   Pulse: 65   Resp: 17   Temp: 36.4 ?C (97.6 ?F)   SpO2: 99%      Physical Exam  Constitutional:       General: NAD     Appearance: Normal appearance.   HENT:      Head: Normocephalic and atraumatic.      Nose: Nose normal. No congestion.   Eyes:      General: No scleral icterus.     Conjunctiva/sclera: Conjunctivae normal.   Cardiovascular:      Rate and Rhythm: Normal rate and regular rhythm.      Heart sounds: No murmur heard.  Pulmonary:      Effort: Pulmonary effort is normal. No respiratory distress.      Breath sounds: Normal breath sounds. No wheezing.   Abdominal:      General:  no distension.   Musculoskeletal:         General: No swelling, no overt deformity or swelling   Skin:     General: Skin is warm and dry.      Coloration: Skin is not jaundiced     Findings: No bruising or erythema on exposed surfaces   Neurological:      General: No overt focal deficit present. Symmetrical speech facial movements, non-slurred speech     Mental Status: alert and oriented  Psychiatric:         Mood and Affect: normal.         Behavior: Behavior normal.         Thought Content: Thought content normal.         Judgment: Judgment normal.      A&P     -. Type 2 diabetes mellitus without complication, without long-term current use of insulin (HCC/RAF)  - stable  - metFORMIN (GLUCOPHAGE XR) 500 mg PO ER 24 hr tablet; Take 2 tablets (1,000 mg total) by mouth two (2) times daily.  Dispense: 360 tablet; Refill: 3  - dulaglutide (TRULICITY) 0.75 mg/0.5 mL pen; Inject 0.5 mLs (0.75 mg total) under the skin every seven (7) days.  Dispense: 6 mL; Refill: 3  - empagliflozin (JARDIANCE) 25 mg tablet; TAKE 1 TABLET(25 MG) BY MOUTH EVERY MORNING.  Dispense: 90 tablet; Refill: 3  -  Hgb A1c; Future  - Hgb A1c    -. Hypertension associated with diabetes (HCC/RAF)  - not at goal   - continue hydroCHLOROthiazide 25 mg tablet; Take 1 tablet (25 mg total) by mouth daily.  Dispense: 90 tablet; Refill: 3  - increase from 50 mg to losartan 100 mg tablet; Take 1 tablet (100 mg total) by mouth daily.  Dispense: 90 tablet; Refill: 3    -. Hyperlipidemia associated with type 2 diabetes mellitus (HCC/RAF)  - atorvastatin 80 mg tablet; Take 1 tablet (80 mg total) by mouth daily.      33 minutes were spent personally by me today on this encounter which include today's pre-visit review of the chart, obtaining appropriate history, performing an evaluation, documentation and discussion of management with details supported within the note for today's visit. The time documented was exclusive of any time spent on the separately billed procedure.     The above plan of care, diagnosis, orders, and follow-up were discussed with the patient.  Questions related to this recommended plan of care were answered.

## 2022-05-19 ENCOUNTER — Ambulatory Visit (INDEPENDENT_AMBULATORY_CARE_PROVIDER_SITE_OTHER): Payer: No Typology Code available for payment source | Admitting: Neurology

## 2022-05-19 ENCOUNTER — Encounter: Payer: Self-pay | Admitting: Neurology

## 2022-05-19 VITALS — BP 135/87 | HR 73 | Ht 73.0 in | Wt 237.0 lb

## 2022-05-19 DIAGNOSIS — G43709 Chronic migraine without aura, not intractable, without status migrainosus: Secondary | ICD-10-CM | POA: Diagnosis not present

## 2022-05-19 DIAGNOSIS — G4733 Obstructive sleep apnea (adult) (pediatric): Secondary | ICD-10-CM | POA: Diagnosis not present

## 2022-05-19 MED ORDER — BUTALBITAL-APAP-CAFFEINE 50-325-40 MG PO TABS: ORAL_TABLET | ORAL | 3 refills | Status: AC

## 2022-05-19 NOTE — Progress Notes (Signed)
CPAP order sent to [facility] via community message.
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
## 2022-05-19 NOTE — Patient Instructions (Signed)
I will increase your CPAP pressure settings Monitor your headaches, may need to restart Topamax I will pull your CPAP download in 4 weeks to see how your data looks See you back in 6 months

## 2022-05-20 ENCOUNTER — Telehealth: Payer: Self-pay

## 2022-05-20 NOTE — Telephone Encounter (Signed)
CPAP orders sent to [facility] via community message.

## 2022-05-28 ENCOUNTER — Other Ambulatory Visit: Payer: Self-pay | Admitting: Family Medicine

## 2022-05-28 NOTE — Telephone Encounter (Signed)
Requested medications are due for refill today.  Provider to decide  Requested medications are on the active medications list.  yes  Last refill. 05/01/2022 #30 0 rf  Future visit scheduled.   no  Notes to clinic.  Refill not delegated.    Requested Prescriptions  Pending Prescriptions Disp Refills   cyclobenzaprine (FLEXERIL) 5 MG tablet [Pharmacy Med Name: CYCLOBENZAPRINE 5MG TABLETS] 30 tablet 0    Sig: TAKE 1 TABLET(5 MG) BY MOUTH THREE TIMES DAILY AS NEEDED FOR MUSCLE SPASMS     Not Delegated - Analgesics:  Muscle Relaxants Failed - 05/28/2022  5:18 PM      Failed - This refill cannot be delegated      Failed - Valid encounter within last 6 months    Recent Outpatient Visits           9 months ago Pure hypercholesterolemia   [place] Susy Frizzle, MD   1 year ago Current moderate episode of major depressive disorder without prior episode ([place])   [place] Pickard, Cammie Mcgee, MD   1 year ago Viral upper respiratory tract infection   [place] Pickard, Cammie Mcgee, MD   1 year ago Acute pain of right knee   [place], Cammie Mcgee, MD   1 year ago Need for immunization against influenza   [place] Pickard, Cammie Mcgee, MD

## 2022-06-01 ENCOUNTER — Other Ambulatory Visit: Payer: Self-pay | Admitting: Family Medicine

## 2022-06-01 MED ORDER — CYCLOBENZAPRINE HCL 5 MG PO TABS: 5.0000 mg | ORAL_TABLET | Freq: Three times a day (TID) | ORAL | 1 refills | Status: DC | PRN

## 2022-06-01 MED ORDER — CYCLOBENZAPRINE HCL 5 MG PO TABS: 5.0000 mg | ORAL_TABLET | Freq: Three times a day (TID) | ORAL | 0 refills | Status: DC | PRN

## 2022-06-02 ENCOUNTER — Other Ambulatory Visit: Payer: Self-pay | Admitting: Family Medicine

## 2022-06-03 ENCOUNTER — Telehealth: Payer: BLUE CROSS/BLUE SHIELD

## 2022-06-03 NOTE — Telephone Encounter
Faxed over referral and order to [facility] [phone] 1:57pm

## 2022-06-03 NOTE — Telephone Encounter
Call Back Request      Reason for call back: Sal from [facility] (Dr. Gaynelle Arabian) is requesting a copy of the referral / physician's order.     [id number]    Any Symptoms:  []$  Yes  [x]$  No      If yes, what symptoms are you experiencing:    Duration of symptoms (how long):    Have you taken medication for symptoms (OTC or Rx):      If call was taken outside of clinic hours:    []$ Patient or caller has been notified that this message was sent outside of normal clinic hours.     []$ Patient or caller has been warm transferred to the physician's answering service. If applicable, patient or caller informed to please call us back if symptoms progress.  Patient or caller has been notified of the turnaround time of 1-2 business day(s).

## 2022-06-04 ENCOUNTER — Telehealth: Payer: Self-pay

## 2022-06-04 NOTE — Telephone Encounter (Signed)
PA-Linzess has been sent to plan

## 2022-06-16 ENCOUNTER — Other Ambulatory Visit: Payer: Self-pay | Admitting: Family Medicine

## 2022-06-16 ENCOUNTER — Encounter: Payer: Self-pay | Admitting: Neurology

## 2022-06-16 DIAGNOSIS — G4733 Obstructive sleep apnea (adult) (pediatric): Secondary | ICD-10-CM

## 2022-06-23 NOTE — Telephone Encounter (Signed)
Community message sent to [facility] for orders

## 2022-06-25 ENCOUNTER — Ambulatory Visit: Payer: BLUE CROSS/BLUE SHIELD

## 2022-06-25 ENCOUNTER — Telehealth: Payer: BLUE CROSS/BLUE SHIELD

## 2022-06-25 DIAGNOSIS — G8929 Other chronic pain: Secondary | ICD-10-CM

## 2022-06-25 DIAGNOSIS — M25511 Pain in right shoulder: Secondary | ICD-10-CM

## 2022-06-25 MED ORDER — MELOXICAM 15 MG PO TABS
15 mg | ORAL_TABLET | Freq: Every day | ORAL | 0 refills | Status: AC
Start: 2022-06-25 — End: ?

## 2022-06-25 NOTE — Progress Notes
Name: Keith Cabrera  Medical Record Number: [id number]  Date of Service: 06/25/2022  PCP: Erling Cruz., MD    Chief Complaint: neck and shoulders pain      HPI  Keith Cabrera is a 62 y.o. male who presents shoulder pain  Mechanical fall last year, sustained R shoulder pain  Pain seems to change locations- sometimes posterior shoulder, sometimes biceps, sometimes forearm. Imaging showing degenerative changes to [facility] joint but no fractures.    ROS  see HPI    Past Medical History:   Diagnosis Date    Coronary artery disease     Diabetes mellitus, type 2 (HCC/RAF)     Hyperlipidemia     Hypertension        Past Surgical History:   Procedure Laterality Date    COLONOSCOPY N/A 02/21/2016    CORONARY STENT PLACEMENT  03/13/13       Family History   Problem Relation Age of Onset    Alzheimer's disease Mother     Heart disease Father        Social History     Tobacco Use    Smoking status: Former    Smokeless tobacco: Never    Tobacco comments:     occ. cigar       Medications that the patient states to be currently taking   Medication Sig    aspirin 81 mg EC tablet Take 1 tablet (81 mg total) by mouth daily.    atorvastatin 80 mg tablet Take 1 tablet (80 mg total) by mouth daily.    dulaglutide (TRULICITY) 1.5 mg/0.5 mL pen Inject 0.5 mLs (1.5 mg total) under the skin every seven (7) days.    empagliflozin (JARDIANCE) 25 mg tablet TAKE 1 TABLET(25 MG) BY MOUTH EVERY MORNING.    hydroCHLOROthiazide 25 mg tablet Take 1 tablet (25 mg total) by mouth daily.    losartan 100 mg tablet Take 1 tablet (100 mg total) by mouth daily.    metFORMIN (GLUCOPHAGE XR) 500 mg PO ER 24 hr tablet Take 2 tablets (1,000 mg total) by mouth two (2) times daily.    naproxen 500 mg tablet Take 1 tablet (500 mg total) by mouth two (2) times daily with meals.    Omega-3 Fatty Acids (FISH OIL) 1000 mg capsule Take 1 capsule (1 g total) by mouth daily.    ONETOUCH DELICA FINE lancets USE 1 LANCET TWICE A DAY    ONETOUCH ULTRA BLUE test strip USE 1 STRIP TWICE A DAY    vitamin E 100 unit capsule Take 1 capsule (45 mg total) by mouth daily.       No Known Allergies    Physical Exam    BP 130/78  ~ Pulse 92  ~ Temp 37.1 ?C (98.8 ?F) (Tympanic)  ~ Resp 16  ~ Ht 5' 11'' (1.803 m)  ~ Wt 214 lb (97.1 kg)  ~ BMI 29.85 kg/m?     GEN: NAD, Non-toxic, Well-appearing  Musc: R shoulder no swelling, non tender, good ROM with abduction, internal and external rotation, BL equal grip strength.       Assessment/Plan    1. Chronic right shoulder pain  Acute on chronic R shoulder pain. Start meloxicam along with physical therapy   - Referral to Rehabilitation, Physical Therapy  - meloxicam 15 mg tablet; Take 1 tablet (15 mg total) by mouth daily.  Dispense: 20 tablet; Refill: 0      Dr Aquilla Solian MD  [facility]  [place]  [phone]                              PROBLEM - Moderate complexity   1 or more chronic illnesses with exacerbation, progression or side effects of treatment was addressed during this encounter.                                  RISK - Moderate risk   Prescription drug management.

## 2022-06-25 NOTE — Telephone Encounter
Referral Request    1) Patient is requesting a referral to:  Cardiologist     Specific location?    Particular MD in mind? Dr. Work fax# [phone]    2) The issue (diagnosis, symptoms): needs a new referral for same dx: Coronary artery disease involving native coronary artery of native heart without.[name] Kitchen     3) Has patient been seen by their doctor for this issue? yes     4) Was an appointment offered? no    5) Patient's last office visit: 02/09/22    Patient or caller has been notified of the turnaround time of 1-2 business day(s).

## 2022-06-25 NOTE — Telephone Encounter
Done,message sent through portal. Patients health plan does not require referral from PCP

## 2022-07-06 ENCOUNTER — Other Ambulatory Visit: Payer: Self-pay | Admitting: Family Medicine

## 2022-07-06 ENCOUNTER — Telehealth: Payer: BLUE CROSS/BLUE SHIELD

## 2022-07-06 DIAGNOSIS — F411 Generalized anxiety disorder: Secondary | ICD-10-CM

## 2022-07-06 NOTE — Telephone Encounter
PDL Call to Clinic    Reason for Call: Megan from Dr. Works office calling back in regards to below encounter. Jinny Blossom states that even if a referral is technically nott required, pts insurance company is requesting one for pt to be seen by Dr. Work.     Appointment Related?  []  Yes  [x]  No     If yes;  Date:  Time:    Call warm transferred to PDL: []  Yes  [x]  No    Call Received by Clinic Representative:     If call not answered/not accepted, call received by Patient Services Representative:

## 2022-07-06 NOTE — Telephone Encounter
PDL Call to Clinic    Reason for Call: Megan from Dr. Works office calling back in regards to below encounter.     Appointment Related?  []  Yes  [x]  No     If yes;  Date:  Time:    Call warm transferred to PDL: [x]  Yes  []  No    Call Received by Clinic Representative:     If call not answered/not accepted, call received by Patient Services Representative:

## 2022-07-07 DIAGNOSIS — I2510 Atherosclerotic heart disease of native coronary artery without angina pectoris: Secondary | ICD-10-CM

## 2022-07-07 NOTE — Telephone Encounter
Dr.Ram,    Patient and office are aware we do not issue referrals, can you enter it anyway so that we can fax the order.    Thank You,  Marlowe Kays

## 2022-07-07 NOTE — Telephone Encounter
Done

## 2022-07-07 NOTE — Telephone Encounter
Order faxed.

## 2022-07-07 NOTE — Telephone Encounter (Signed)
Requested medication (s) are due for refill today - yes  Requested medication (s) are on the active medication list - yes  Future visit scheduled -no  Last refill: 09/22/21 listed as historical provider  Notes to clinic: see above  Requested Prescriptions  Pending Prescriptions Disp Refills   pantoprazole (PROTONIX) 40 MG tablet [Pharmacy Med Name: PANTOPRAZOLE 40MG  TABLETS] 180 tablet     Sig: TAKE 1 TABLET(40 MG) BY MOUTH TWICE DAILY     Gastroenterology: Proton Pump Inhibitors Passed - 07/06/2022  3:53 PM      Passed - Valid encounter within last 12 months    Recent Outpatient Visits           11 months ago Pure hypercholesterolemia   [place] Pickard, Cammie Mcgee, MD   1 year ago Current moderate episode of major depressive disorder without prior episode ([place])   [place] Pickard, Cammie Mcgee, MD   1 year ago Viral upper respiratory tract infection   [place] Pickard, Cammie Mcgee, MD   1 year ago Acute pain of right knee   [place] [facility], Cammie Mcgee, MD   1 year ago Need for immunization against influenza   [place], Cammie Mcgee, MD                 Requested Prescriptions  Pending Prescriptions Disp Refills   pantoprazole (PROTONIX) 40 MG tablet [Pharmacy Med Name: PANTOPRAZOLE 40MG  TABLETS] 180 tablet     Sig: TAKE 1 TABLET(40 MG) BY MOUTH TWICE DAILY     Gastroenterology: Proton Pump Inhibitors Passed - 07/06/2022  3:53 PM      Passed - Valid encounter within last 12 months    Recent Outpatient Visits           11 months ago Pure hypercholesterolemia   [place] Pickard, Cammie Mcgee, MD   1 year ago Current moderate episode of major depressive disorder without prior episode ([place])   [place] Pickard, Cammie Mcgee, MD   1 year ago Viral upper respiratory tract infection   [place] Pickard, Cammie Mcgee, MD   1 year ago  Acute pain of right knee   [place], Cammie Mcgee, MD   1 year ago Need for immunization against influenza   [place] Pickard, Cammie Mcgee, MD

## 2022-07-20 ENCOUNTER — Other Ambulatory Visit: Payer: Self-pay | Admitting: Family Medicine

## 2022-07-27 ENCOUNTER — Other Ambulatory Visit: Payer: Self-pay | Admitting: Family Medicine

## 2022-07-27 DIAGNOSIS — J309 Allergic rhinitis, unspecified: Secondary | ICD-10-CM

## 2022-08-05 ENCOUNTER — Other Ambulatory Visit: Payer: Self-pay | Admitting: Family Medicine

## 2022-08-05 ENCOUNTER — Telehealth: Payer: BLUE CROSS/BLUE SHIELD

## 2022-08-05 DIAGNOSIS — F411 Generalized anxiety disorder: Secondary | ICD-10-CM

## 2022-08-05 MED ORDER — METFORMIN HCL ER 500 MG PO TB24
1000 mg | ORAL_TABLET | Freq: Two times a day (BID) | ORAL | 3 refills | Status: AC
Start: 2022-08-05 — End: ?

## 2022-08-05 MED ORDER — LOSARTAN POTASSIUM 100 MG PO TABS
100 mg | ORAL_TABLET | Freq: Every day | ORAL | 3 refills | Status: AC
Start: 2022-08-05 — End: ?

## 2022-08-05 MED ORDER — HYDROCHLOROTHIAZIDE 25 MG PO TABS
25 mg | ORAL_TABLET | Freq: Every day | ORAL | 3 refills | Status: AC
Start: 2022-08-05 — End: ?

## 2022-08-05 MED ORDER — TRULICITY 1.5 MG/0.5ML SC SOPN
1.5 mg | SUBCUTANEOUS | 3 refills | Status: AC
Start: 2022-08-05 — End: ?

## 2022-08-05 NOTE — Telephone Encounter
Orders Request    What is being requested? (Tests, Labs, Imaging, etc.): Labs    Reason for the request: Per patient he has a 3 month follow up appointment scheduled with Dr. Ardyth Man on 05/10 and would like to get labs done before his appointment.    Where does the patient want to be seen? [facility]    If outside [facility], what is the fax number to the facility?      Has the patient seen their doctor for this matter? No    Last office visit: 02/09/2022    Patient or caller was offered an appointment but declined.    Patient or caller has been notified of the turnaround time of 1-2 business day(s).

## 2022-08-05 NOTE — Patient Instructions (Incomplete)
Mr. Domagalski , Thank you for taking time to come for your Medicare Wellness Visit. I appreciate your ongoing commitment to your health goals. Please review the following plan we discussed and let me know if I can assist you in the future.   These are the goals we discussed:  Goals      Exercise 3x per week (30 min per time)     Continue to move and stay healthy.  Have knee repaired.         This is a list of the screening recommended for you and due dates:  Health Maintenance  Topic Date Due   Zoster (Shingles) Vaccine (1 of 2) Never done   Screening for Lung Cancer  05/08/2016   COVID-19 Vaccine (3 - 2023-24 season) 12/05/2021   Medicare Annual Wellness Visit  08/01/2022   Flu Shot  11/05/2022   DTaP/Tdap/Td vaccine (2 - Td or Tdap) 01/05/2023   Colon Cancer Screening  08/01/2024   Hepatitis C Screening: USPSTF Recommendation to screen - Ages 18-79 yo.  Completed   HIV Screening  Completed   HPV Vaccine  Aged Out    Advanced directives: Please bring a copy of your health care power of attorney and living will to the office to be added to your chart at your convenience.   Conditions/risks identified: Aim for 30 minutes of exercise or brisk walking, 6-8 glasses of water, and 5 servings of fruits and vegetables each day.   Next appointment: Follow up in one year for your annual wellness visit   Preventive Care 40-64 Years, Male Preventive care refers to lifestyle choices and visits with your health care provider that can promote health and wellness. What does preventive care include? A yearly physical exam. This is also called an annual well check. Dental exams once or twice a year. Routine eye exams. Ask your health care provider how often you should have your eyes checked. Personal lifestyle choices, including: Daily care of your teeth and gums. Regular physical activity. Eating a healthy diet. Avoiding tobacco and drug use. Limiting alcohol use. Practicing safe  sex. Taking low-dose aspirin every day starting at age 28. What happens during an annual well check? The services and screenings done by your health care provider during your annual well check will depend on your age, overall health, lifestyle risk factors, and family history of disease. Counseling  Your health care provider may ask you questions about your: Alcohol use. Tobacco use. Drug use. Emotional well-being. Home and relationship well-being. Sexual activity. Eating habits. Work and work Astronomer. Screening  You may have the following tests or measurements: Height, weight, and BMI. Blood pressure. Lipid and cholesterol levels. These may be checked every 5 years, or more frequently if you are over 4 years old. Skin check. Lung cancer screening. You may have this screening every year starting at age 4 if you have a 30-pack-year history of smoking and currently smoke or have quit within the past 15 years. Fecal occult blood test (FOBT) of the stool. You may have this test every year starting at age 37. Flexible sigmoidoscopy or colonoscopy. You may have a sigmoidoscopy every 5 years or a colonoscopy every 10 years starting at age 52. Prostate cancer screening. Recommendations will vary depending on your family history and other risks. Hepatitis C blood test. Hepatitis B blood test. Sexually transmitted disease (STD) testing. Diabetes screening. This is done by checking your blood sugar (glucose) after you have not eaten for a while (fasting). You  may have this done every 1-3 years. Discuss your test results, treatment options, and if necessary, the need for more tests with your health care provider. Vaccines  Your health care provider may recommend certain vaccines, such as: Influenza vaccine. This is recommended every year. Tetanus, diphtheria, and acellular pertussis (Tdap, Td) vaccine. You may need a Td booster every 10 years. Zoster vaccine. You may need this after age  4. Pneumococcal 13-valent conjugate (PCV13) vaccine. You may need this if you have certain conditions and have not been vaccinated. Pneumococcal polysaccharide (PPSV23) vaccine. You may need one or two doses if you smoke cigarettes or if you have certain conditions. Talk to your health care provider about which screenings and vaccines you need and how often you need them. This information is not intended to replace advice given to you by your health care provider. Make sure you discuss any questions you have with your health care provider. Document Released: 04/19/2015 Document Revised: 12/11/2015 Document Reviewed: 01/22/2015 Elsevier Interactive Patient Education  2017 [employer].  Fall Prevention in the Home Falls can cause injuries. They can happen to people of all ages. There are many things you can do to make your home safe and to help prevent falls. What can I do on the outside of my home? Regularly fix the edges of walkways and driveways and fix any cracks. Remove anything that might make you trip as you walk through a door, such as a raised step or threshold. Trim any bushes or trees on the path to your home. Use bright outdoor lighting. Clear any walking paths of anything that might make someone trip, such as rocks or tools. Regularly check to see if handrails are loose or broken. Make sure that both sides of any steps have handrails. Any raised decks and porches should have guardrails on the edges. Have any leaves, snow, or ice cleared regularly. Use sand or salt on walking paths during winter. Clean up any spills in your garage right away. This includes oil or grease spills. What can I do in the bathroom? Use night lights. Install grab bars by the toilet and in the tub and shower. Do not use towel bars as grab bars. Use non-skid mats or decals in the tub or shower. If you need to sit down in the shower, use a plastic, non-slip stool. Keep the floor dry. Clean up any water  that spills on the floor as soon as it happens. Remove soap buildup in the tub or shower regularly. Attach bath mats securely with double-sided non-slip rug tape. Do not have throw rugs and other things on the floor that can make you trip. What can I do in the bedroom? Use night lights. Make sure that you have a light by your bed that is easy to reach. Do not use any sheets or blankets that are too big for your bed. They should not hang down onto the floor. Have a firm chair that has side arms. You can use this for support while you get dressed. Do not have throw rugs and other things on the floor that can make you trip. What can I do in the kitchen? Clean up any spills right away. Avoid walking on wet floors. Keep items that you use a lot in easy-to-reach places. If you need to reach something above you, use a strong step stool that has a grab bar. Keep electrical cords out of the way. Do not use floor polish or wax that makes floors slippery.  If you must use wax, use non-skid floor wax. Do not have throw rugs and other things on the floor that can make you trip. What can I do with my stairs? Do not leave any items on the stairs. Make sure that there are handrails on both sides of the stairs and use them. Fix handrails that are broken or loose. Make sure that handrails are as long as the stairways. Check any carpeting to make sure that it is firmly attached to the stairs. Fix any carpet that is loose or worn. Avoid having throw rugs at the top or bottom of the stairs. If you do have throw rugs, attach them to the floor with carpet tape. Make sure that you have a light switch at the top of the stairs and the bottom of the stairs. If you do not have them, ask someone to add them for you. What else can I do to help prevent falls? Wear shoes that: Do not have high heels. Have rubber bottoms. Are comfortable and fit you well. Are closed at the toe. Do not wear sandals. If you use a  stepladder: Make sure that it is fully opened. Do not climb a closed stepladder. Make sure that both sides of the stepladder are locked into place. Ask someone to hold it for you, if possible. Clearly mark and make sure that you can see: Any grab bars or handrails. First and last steps. Where the edge of each step is. Use tools that help you move around (mobility aids) if they are needed. These include: Canes. Walkers. Scooters. Crutches. Turn on the lights when you go into a dark area. Replace any light bulbs as soon as they burn out. Set up your furniture so you have a clear path. Avoid moving your furniture around. If any of your floors are uneven, fix them. If there are any pets around you, be aware of where they are. Review your medicines with your doctor. Some medicines can make you feel dizzy. This can increase your chance of falling. Ask your doctor what other things that you can do to help prevent falls. This information is not intended to replace advice given to you by your health care provider. Make sure you discuss any questions you have with your health care provider. Document Released: 01/17/2009 Document Revised: 08/29/2015 Document Reviewed: 04/27/2014 Elsevier Interactive Patient Education  2017 [employer].

## 2022-08-05 NOTE — Telephone Encounter (Signed)
Requested medication (s) are due for refill today- yes  Requested medication (s) are on the active medication list -yes  Future visit scheduled -no  Last refill: 07/06/22 #30  Notes to clinic: OV 10/21/21- last RF has notes- must have appointment- sent for review   Requested Prescriptions  Pending Prescriptions Disp Refills   venlafaxine XR (EFFEXOR-XR) 75 MG 24 hr capsule [Pharmacy Med Name: VENLAFAXINE ER 75MG  CAPSULES] 30 capsule 0    Sig: TAKE 1 CAPSULE(75 MG) BY MOUTH DAILY WITH BREAKFAST. STOP ESCITALOPRAM     Psychiatry: Antidepressants - SNRI - desvenlafaxine & venlafaxine Failed - 08/05/2022  9:09 AM      Failed - Valid encounter within last 6 months    Recent Outpatient Visits           1 year ago Pure hypercholesterolemia   [facility] Pickard, Priscille Heidelberg, MD   1 year ago Current moderate episode of major depressive disorder without prior episode (HCC)   [facility] Pickard, Priscille Heidelberg, MD   1 year ago Viral upper respiratory tract infection   [facility] Pickard, Priscille Heidelberg, MD   1 year ago Acute pain of right knee   [facility] Tanya Nones, Priscille Heidelberg, MD   1 year ago Need for immunization against influenza   [facility] Donita Brooks, MD              Failed - Lipid Panel in normal range within the last 12 months    Cholesterol, Total  Date Value Ref Range Status  12/22/2017 163 100 - 199 mg/dL Final   Cholesterol  Date Value Ref Range Status  08/04/2021 174 <200 mg/dL Final   LDL Cholesterol (Calc)  Date Value Ref Range Status  08/04/2021 101 (H) mg/dL (calc) Final    Comment:    Reference range: <100 . Desirable range <100 mg/dL for primary prevention;   <70 mg/dL for patients with CHD or diabetic patients  with > or = 2 CHD risk factors. [name] Kitchen LDL-C is now calculated using the Martin-Hopkins  calculation, which is a validated novel method providing  better accuracy than the  Friedewald equation in the  estimation of LDL-C.  Horald Pollen et al. Lenox Ahr. 1610;960(45): 2061-2068  (http://education.[facility].com/faq/FAQ164)    HDL  Date Value Ref Range Status  08/04/2021 43 > OR = 40 mg/dL Final  40/98/1191 32 (L) >39 mg/dL Final   Triglycerides  Date Value Ref Range Status  08/04/2021 182 (H) <150 mg/dL Final         Passed - Cr in normal range and within 360 days    Creat  Date Value Ref Range Status  10/21/2021 1.02 0.70 - 1.35 mg/dL Final         Passed - Last BP in normal range    BP Readings from Last 1 Encounters:  05/19/22 135/87            Requested Prescriptions  Pending Prescriptions Disp Refills   venlafaxine XR (EFFEXOR-XR) 75 MG 24 hr capsule [Pharmacy Med Name: VENLAFAXINE ER 75MG  CAPSULES] 30 capsule 0    Sig: TAKE 1 CAPSULE(75 MG) BY MOUTH DAILY WITH BREAKFAST. STOP ESCITALOPRAM     Psychiatry: Antidepressants - SNRI - desvenlafaxine & venlafaxine Failed - 08/05/2022  9:09 AM      Failed - Valid encounter within last 6 months    Recent Outpatient Visits           1  year ago Pure hypercholesterolemia   [facility] Pickard, Priscille Heidelberg, MD   1 year ago Current moderate episode of major depressive disorder without prior episode (HCC)   [facility] Pickard, Priscille Heidelberg, MD   1 year ago Viral upper respiratory tract infection   [facility] Pickard, Priscille Heidelberg, MD   1 year ago Acute pain of right knee   [facility] Tanya Nones, Priscille Heidelberg, MD   1 year ago Need for immunization against influenza   [facility] Donita Brooks, MD              Failed - Lipid Panel in normal range within the last 12 months    Cholesterol, Total  Date Value Ref Range Status  12/22/2017 163 100 - 199 mg/dL Final   Cholesterol  Date Value Ref Range Status  08/04/2021 174 <200 mg/dL Final   LDL Cholesterol (Calc)  Date Value Ref Range Status  08/04/2021 101 (H)  mg/dL (calc) Final    Comment:    Reference range: <100 . Desirable range <100 mg/dL for primary prevention;   <70 mg/dL for patients with CHD or diabetic patients  with > or = 2 CHD risk factors. [name] Kitchen LDL-C is now calculated using the Martin-Hopkins  calculation, which is a validated novel method providing  better accuracy than the Friedewald equation in the  estimation of LDL-C.  Horald Pollen et al. Lenox Ahr. 3244;010(27): 2061-2068  (http://education.[facility].com/faq/FAQ164)    HDL  Date Value Ref Range Status  08/04/2021 43 > OR = 40 mg/dL Final  25/36/6440 32 (L) >39 mg/dL Final   Triglycerides  Date Value Ref Range Status  08/04/2021 182 (H) <150 mg/dL Final         Passed - Cr in normal range and within 360 days    Creat  Date Value Ref Range Status  10/21/2021 1.02 0.70 - 1.35 mg/dL Final         Passed - Last BP in normal range    BP Readings from Last 1 Encounters:  05/19/22 135/87
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
## 2022-08-06 ENCOUNTER — Ambulatory Visit (INDEPENDENT_AMBULATORY_CARE_PROVIDER_SITE_OTHER): Payer: No Typology Code available for payment source

## 2022-08-06 ENCOUNTER — Telehealth: Payer: Self-pay

## 2022-08-06 VITALS — BP 122/80 | Ht 73.0 in | Wt 249.0 lb

## 2022-08-06 DIAGNOSIS — E119 Type 2 diabetes mellitus without complications: Secondary | ICD-10-CM

## 2022-08-06 DIAGNOSIS — Z0000 Encounter for general adult medical examination without abnormal findings: Secondary | ICD-10-CM

## 2022-08-06 NOTE — Telephone Encounter
Patient notified

## 2022-08-06 NOTE — Telephone Encounter
Orders are placed 

## 2022-08-06 NOTE — Telephone Encounter (Signed)
Patient seen for AWV and is requesting rx for prednisone due to shoulder and back strain.  Scheduled for physical 09/15/22

## 2022-08-07 ENCOUNTER — Other Ambulatory Visit: Payer: Self-pay | Admitting: Family Medicine

## 2022-08-07 MED ORDER — MELOXICAM 15 MG PO TABS: 15.0000 mg | ORAL_TABLET | Freq: Every day | ORAL | 0 refills | Status: DC

## 2022-08-07 NOTE — Telephone Encounter (Signed)
Pt called back stating he is can't take Mobic. Advised pt that he will need to be seen in office for evaluation. Appt made to be seen on 08/10/2022 @ 11 am. Pt verbalized understanding of all. Mjp,lpn

## 2022-08-10 ENCOUNTER — Encounter: Payer: Self-pay | Admitting: Family Medicine

## 2022-08-10 ENCOUNTER — Ambulatory Visit (INDEPENDENT_AMBULATORY_CARE_PROVIDER_SITE_OTHER): Payer: No Typology Code available for payment source | Admitting: Family Medicine

## 2022-08-10 VITALS — BP 130/80 | HR 75 | Temp 98.5°F | Ht 73.0 in | Wt 249.0 lb

## 2022-08-10 DIAGNOSIS — I10 Essential (primary) hypertension: Secondary | ICD-10-CM

## 2022-08-10 DIAGNOSIS — S39012A Strain of muscle, fascia and tendon of lower back, initial encounter: Secondary | ICD-10-CM

## 2022-08-10 MED ORDER — PREDNISONE 20 MG PO TABS: ORAL_TABLET | ORAL | 0 refills | Status: AC

## 2022-08-10 MED ORDER — CYCLOBENZAPRINE HCL 5 MG PO TABS: 5.0000 mg | ORAL_TABLET | Freq: Three times a day (TID) | ORAL | 1 refills | Status: DC | PRN
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
## 2022-08-11 LAB — COMPLETE METABOLIC PANEL WITH GFR
AG Ratio: 2.6 (calc) — ABNORMAL HIGH (ref 1.0–2.5)
ALT: 37 U/L (ref 9–46)
AST: 28 U/L (ref 10–35)
Albumin: 5.1 g/dL (ref 3.6–5.1)
Alkaline phosphatase (APISO): 75 U/L (ref 35–144)
BUN: 16 mg/dL (ref 7–25)
CO2: 28 mmol/L (ref 20–32)
Calcium: 9.5 mg/dL (ref 8.6–10.3)
Chloride: 102 mmol/L (ref 98–110)
Creat: 1.12 mg/dL (ref 0.70–1.35)
Globulin: 2 g/dL (calc) (ref 1.9–3.7)
Glucose, Bld: 89 mg/dL (ref 65–99)
Potassium: 4.4 mmol/L (ref 3.5–5.3)
Sodium: 141 mmol/L (ref 135–146)
Total Bilirubin: 0.5 mg/dL (ref 0.2–1.2)
Total Protein: 7.1 g/dL (ref 6.1–8.1)
eGFR: 74 mL/min/{1.73_m2} (ref >=60)

## 2022-08-11 LAB — LIPID PANEL
Cholesterol: 185 mg/dL (ref <200)
HDL: 54 mg/dL (ref >=40)
LDL Cholesterol (Calc): 100 mg/dL (calc) — ABNORMAL HIGH
Non-HDL Cholesterol (Calc): 131 mg/dL (calc) — ABNORMAL HIGH (ref <130)
Total CHOL/HDL Ratio: 3.4 (calc) (ref <5.0)
Triglycerides: 191 mg/dL — ABNORMAL HIGH (ref <150)

## 2022-08-14 ENCOUNTER — Ambulatory Visit: Payer: BLUE CROSS/BLUE SHIELD

## 2022-08-14 DIAGNOSIS — E119 Type 2 diabetes mellitus without complications: Secondary | ICD-10-CM

## 2022-08-14 NOTE — Progress Notes
PROGRESS NOTE    Patient:  Keith Cabrera    Medical record number:  [id number]    Date of birth:  06-29-1960  Date of service:  08/14/2022      Chief complaint:    Chief Complaint   Patient presents with    Follow-up       History of present illness:           Gunner Iodice is a 62 y.o. male          Who presents with the following history of present illness:    He is here for DM f/u     Past medical history:           Patient Active Problem List   Diagnosis    Type 2 diabetes mellitus without complication (HCC/RAF)    History of coronary artery stent placement    Coronary artery disease involving native coronary artery without angina pectoris    Hypertension associated with diabetes (HCC/RAF)    Mixed hyperlipidemia    Ingrown toenail    History of colonoscopy with polypectomy    Smokes cigars    Hyperlipidemia associated with type 2 diabetes mellitus (HCC/RAF)            Past Medical History:   Diagnosis Date    Coronary artery disease     Diabetes mellitus, type 2 (HCC/RAF)     Hyperlipidemia     Hypertension      Past surgical history:         Past Surgical History:   Procedure Laterality Date    COLONOSCOPY N/A 02/21/2016    CORONARY STENT PLACEMENT  03/13/13       Medications:         Medications that the patient states to be currently taking   Medication Sig    aspirin 81 mg EC tablet Take 1 tablet (81 mg total) by mouth daily.    atorvastatin 80 mg tablet Take 1 tablet (80 mg total) by mouth daily.    dulaglutide (TRULICITY) 1.5 mg/0.5 mL pen Inject 0.5 mLs (1.5 mg total) under the skin every seven (7) days.    empagliflozin (JARDIANCE) 25 mg tablet TAKE 1 TABLET(25 MG) BY MOUTH EVERY MORNING.    ezetimibe 10 mg tablet Take 1 tablet (10 mg total) by mouth daily.    hydroCHLOROthiazide 25 mg tablet Take 1 tablet (25 mg total) by mouth daily.    losartan 100 mg tablet Take 1 tablet (100 mg total) by mouth daily.    meloxicam 15 mg tablet Take 1 tablet (15 mg total) by mouth daily.    metFORMIN (GLUCOPHAGE XR) 500 mg PO ER 24 hr tablet Take 2 tablets (1,000 mg total) by mouth two (2) times daily.    Omega-3 Fatty Acids (FISH OIL) 1000 mg capsule Take 1 capsule (1 g total) by mouth daily.    ONETOUCH DELICA FINE lancets USE 1 LANCET TWICE A DAY    ONETOUCH ULTRA BLUE test strip USE 1 STRIP TWICE A DAY    vitamin E 100 unit capsule Take 1 capsule (45 mg total) by mouth daily.       Allergy:       No Known Allergies    Family history:         Family History   Problem Relation Age of Onset    Alzheimer's disease Mother     Heart disease Father        Social history:  Social History     Tobacco Use    Smoking status: Former    Smokeless tobacco: Never    Tobacco comments:     occ. cigar   Substance Use Topics    Alcohol use: Not on file           Lab Results   Component Value Date    WBC 8.15 02/09/2022    HGB 15.8 02/09/2022    HCT 47.6 02/09/2022    MCV 95.4 02/09/2022    PLT 285 02/09/2022     Lab Results   Component Value Date    CREAT 0.92 08/06/2022    BUN 15 08/06/2022    NA 139 08/06/2022    K 4.3 08/06/2022    CL 102 08/06/2022    CO2 24 08/06/2022     Lab Results   Component Value Date    HGBA1C 7.0 (H) 08/06/2022     Lab Results   Component Value Date    ALT 28 08/06/2022    AST 28 08/06/2022    ALKPHOS 64 08/06/2022    BILITOT 0.9 08/06/2022     Lab Results   Component Value Date    TSH 2.0 12/06/2021     Lab Results   Component Value Date    CALCIUM 10.2 08/06/2022     Lab Results   Component Value Date    CHOL 170 08/06/2022    CHOLHDL 51 08/06/2022    CHOLDLCAL 74 08/06/2022    TRIGLY 226 (H) 08/06/2022              PE:         BP 114/69  ~ Pulse 72  ~ Temp 36.4 ?C (97.5 ?F) (Tympanic)  ~ Resp 14  ~ Ht 5' 11'' (1.803 m)  ~ Wt 218 lb (98.9 kg)  ~ BMI 30.40 kg/m?           Constitutional:    Appearance:   Well nourished, well developed, in no acute distress  Psychiatric:   Mood normal, affect appropriate    Assessment/ Plan:    1. Hypertension associated with diabetes (HCC/RAF)  - stable   - Comprehensive Metabolic Panel; Future    2. Type 2 diabetes mellitus without complication, without long-term current use of insulin (HCC/RAF)  - he has been out of town and couldn't take Trulicliy   - the pharmacy didn't have 1.5 mg and he was using 0.75 mg   - discussed lab results in detail   - he is motivated to have low carb diet and increase physical activity   - repeat A1c in 3 months   - Hgb A1c; Future    3. Hyperlipidemia associated with type 2 diabetes mellitus (HCC/RAF)  - LDL is stable   - Lipid Panel; Future      There are no Patient Instructions on file for this visit.     The above recommendation were discussed with the patient.  The patient has all questions answered satisfactorily and is in agreement with this recommended plan of care.    Author:       Erling Cruz, MD  11:30 AM      Future Appointments         Provider Department Visit Type    09/15/2022 8:40 AM Polisky, Tyrone Apple., MD, [facility] - Endocrinology [facility]    11/13/2022 8:15 AM Mi Balla, Wylie Hail., MD [facility] MPTF [facility] RETURN

## 2022-08-21 ENCOUNTER — Other Ambulatory Visit: Payer: Self-pay | Admitting: Family Medicine

## 2022-08-21 ENCOUNTER — Telehealth: Payer: Self-pay

## 2022-08-21 MED ORDER — PREDNISONE 20 MG PO TABS: ORAL_TABLET | ORAL | 0 refills | Status: DC

## 2022-08-21 NOTE — Telephone Encounter (Signed)
Pt states prednisone was working well and pain was almost gone. Pt states 2 days ago, he was getting out of the shower and pulled the muscle again. Pt asks if another week of the prednisone can be called in for him? Thanks.

## 2022-08-25 ENCOUNTER — Encounter: Payer: Self-pay | Admitting: Family Medicine

## 2022-08-25 ENCOUNTER — Ambulatory Visit (INDEPENDENT_AMBULATORY_CARE_PROVIDER_SITE_OTHER): Payer: No Typology Code available for payment source | Admitting: Family Medicine

## 2022-08-25 VITALS — BP 124/82 | HR 73 | Temp 98.5°F | Ht 73.0 in | Wt 250.0 lb

## 2022-08-25 DIAGNOSIS — L089 Local infection of the skin and subcutaneous tissue, unspecified: Secondary | ICD-10-CM

## 2022-08-25 MED ORDER — WEGOVY 0.5 MG/0.5ML ~~LOC~~ SOAJ: 0.5000 mg | SUBCUTANEOUS | 3 refills | Status: AC

## 2022-08-25 MED ORDER — TRIAMCINOLONE ACETONIDE 0.1 % EX CREA: 1.0000 | TOPICAL_CREAM | Freq: Two times a day (BID) | CUTANEOUS | 0 refills | Status: DC
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
## 2022-08-28 LAB — MRSA CULTURE

## 2022-08-29 LAB — MRSA CULTURE
MICRO NUMBER:: 14984583
SPECIMEN QUALITY:: ADEQUATE

## 2022-09-03 ENCOUNTER — Other Ambulatory Visit: Payer: Self-pay | Admitting: Family Medicine

## 2022-09-03 ENCOUNTER — Telehealth: Payer: Self-pay | Admitting: Family Medicine

## 2022-09-03 ENCOUNTER — Encounter: Payer: Self-pay | Admitting: Family Medicine

## 2022-09-03 ENCOUNTER — Ambulatory Visit (INDEPENDENT_AMBULATORY_CARE_PROVIDER_SITE_OTHER): Payer: No Typology Code available for payment source | Admitting: Family Medicine

## 2022-09-03 VITALS — BP 120/84 | HR 70 | Temp 98.7°F | Ht 73.0 in | Wt 248.0 lb

## 2022-09-03 DIAGNOSIS — J069 Acute upper respiratory infection, unspecified: Secondary | ICD-10-CM | POA: Insufficient documentation
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
## 2022-09-03 NOTE — Assessment & Plan Note (Signed)

## 2022-09-03 NOTE — Telephone Encounter (Signed)
Received efax from pharmacy to follow up on refill request for  Semaglutide-Weight Management (WEGOVY) 0.5 MG/0.5ML SOAJ  **Message states "Plan does not cover this medication. Please call plan at [phone] to initiate prior auth or call/fax pharmacy to change medication. Patient id # is [id number]."  Efax received from   [facility] [phone] - [place],  - [address] AT [facility] OF [address], [place] [address] Phone: [phone]  Fax: [phone] DEA #: [id number]   Please advise pharmacist.

## 2022-09-03 NOTE — Patient Instructions (Signed)
Your symptoms and exam findings are most consistent with a viral upper respiratory infection. These usually run their course in 5-7 days. Unfortunately, antibiotics don't work against viruses and just increase your risk of other issues such as diarrhea, yeast infections, and resistant infections.  If your symptoms last longer than 10 days and/or you start feeling worse with facial pain, high fever, cough, shortness of breath or start feeling significantly worse, please call us right away to be further evaluated.  Some things that can make you feel better are: - Increased rest - Increasing fluid with water/sugar free electrolytes - Acetaminophen as needed for fever/pain.  - Salt water gargling, chloraseptic spray and throat lozenges - OTC guaifenesin (Mucinex).  - Saline sinus flushes or a neti pot.  - Humidifying the air. - Delsym for cough 

## 2022-09-04 ENCOUNTER — Telehealth: Payer: Self-pay

## 2022-09-04 ENCOUNTER — Encounter: Payer: Self-pay | Admitting: Emergency Medicine

## 2022-09-04 ENCOUNTER — Ambulatory Visit
Admission: EM | Admit: 2022-09-04 | Discharge: 2022-09-04 | Disposition: A | Discharge status: Home / Self Care | Payer: No Typology Code available for payment source | Attending: Internal Medicine | Admitting: Internal Medicine

## 2022-09-04 ENCOUNTER — Ambulatory Visit (INDEPENDENT_AMBULATORY_CARE_PROVIDER_SITE_OTHER): Payer: No Typology Code available for payment source

## 2022-09-04 DIAGNOSIS — R059 Cough, unspecified: Secondary | ICD-10-CM | POA: Diagnosis not present

## 2022-09-04 DIAGNOSIS — J209 Acute bronchitis, unspecified: Secondary | ICD-10-CM

## 2022-09-04 MED ORDER — BENZONATATE 200 MG PO CAPS: 200.0000 mg | ORAL_CAPSULE | Freq: Three times a day (TID) | ORAL | 0 refills | Status: AC | PRN

## 2022-09-04 MED ORDER — ALBUTEROL SULFATE (2.5 MG/3ML) 0.083% IN NEBU
2.5000 mg | INHALATION_SOLUTION | Freq: Once | RESPIRATORY_TRACT | Status: AC
  Administered 2022-09-04: 2.5 mg via RESPIRATORY_TRACT

## 2022-09-04 MED ORDER — DOXYCYCLINE HYCLATE 100 MG PO CAPS: 100.0000 mg | ORAL_CAPSULE | Freq: Two times a day (BID) | ORAL | 0 refills | Status: AC

## 2022-09-04 MED ORDER — ALBUTEROL SULFATE HFA 108 (90 BASE) MCG/ACT IN AERS: 2.0000 | INHALATION_SPRAY | RESPIRATORY_TRACT | 0 refills | Status: AC | PRN
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
## 2022-09-04 NOTE — Telephone Encounter (Signed)
Pt called in to request a refill of this med pregabalin (LYRICA) 150 MG capsule [[id number]]. Pt states that he is completely out of this med.Please advise  LOV: 08/25/22  PHARMACY: [facility] [phone] - [place], [place] - [address] AT [facility] OF [address], [place] [address] Phone: [phone]  Fax: [phone]  CB#: [phone]

## 2022-09-04 NOTE — ED Triage Notes (Signed)
Nasal congestion and cough that started last Thursday.  Has been taking sudafed and cough medication without relief.  Started mucinex yesterday without relief.  Cough is productive at times.

## 2022-09-04 NOTE — Telephone Encounter (Signed)
Requested medication (s) are due for refill today: routing for review  Requested medication (s) are on the active medication list: yes  Last refill:  02/19/22  Future visit scheduled: no  Notes to clinic: Unable to refill per protocol, cannot delegate.       Requested Prescriptions  Pending Prescriptions Disp Refills   pregabalin (LYRICA) 150 MG capsule [Pharmacy Med Name: PREGABALIN 150MG  CAPSULES] 60 capsule     Sig: TAKE 1 CAPSULE(150 MG) BY MOUTH TWICE DAILY     Not Delegated - Neurology:  Anticonvulsants - Controlled - pregabalin Failed - 09/03/2022  5:34 PM      Failed - This refill cannot be delegated      Failed - Valid encounter within last 12 months    Recent Outpatient Visits           1 year ago Pure hypercholesterolemia   [facility] Donita Brooks, MD   1 year ago Current moderate episode of major depressive disorder without prior episode (HCC)   [facility] Pickard, Priscille Heidelberg, MD   1 year ago Viral upper respiratory tract infection   [facility] Pickard, Priscille Heidelberg, MD   1 year ago Acute pain of right knee   [facility] Pickard, Priscille Heidelberg, MD   1 year ago Need for immunization against influenza   [facility] Pickard, Priscille Heidelberg, MD              Passed - Cr in normal range and within 360 days    Creat  Date Value Ref Range Status  08/10/2022 1.12 0.70 - 1.35 mg/dL Final         Passed - Completed PHQ-2 or PHQ-9 in the last 360 days

## 2022-09-07 ENCOUNTER — Other Ambulatory Visit: Payer: Self-pay

## 2022-09-07 DIAGNOSIS — D492 Neoplasm of unspecified behavior of bone, soft tissue, and skin: Secondary | ICD-10-CM

## 2022-09-07 DIAGNOSIS — M545 Low back pain, unspecified: Secondary | ICD-10-CM

## 2022-09-07 DIAGNOSIS — M5136 Other intervertebral disc degeneration, lumbar region: Secondary | ICD-10-CM

## 2022-09-07 MED ORDER — PREGABALIN 150 MG PO CAPS
ORAL_CAPSULE | ORAL | 3 refills | Status: DC
Start: 2022-09-07 — End: 2022-09-09

## 2022-09-09 ENCOUNTER — Other Ambulatory Visit: Payer: Self-pay

## 2022-09-09 ENCOUNTER — Other Ambulatory Visit: Payer: Self-pay | Admitting: Family Medicine

## 2022-09-09 ENCOUNTER — Telehealth: Payer: Self-pay

## 2022-09-09 DIAGNOSIS — D492 Neoplasm of unspecified behavior of bone, soft tissue, and skin: Secondary | ICD-10-CM

## 2022-09-09 DIAGNOSIS — M545 Low back pain, unspecified: Secondary | ICD-10-CM

## 2022-09-09 DIAGNOSIS — M5136 Other intervertebral disc degeneration, lumbar region: Secondary | ICD-10-CM

## 2022-09-09 DIAGNOSIS — F411 Generalized anxiety disorder: Secondary | ICD-10-CM

## 2022-09-09 MED ORDER — PREGABALIN 150 MG PO CAPS
ORAL_CAPSULE | ORAL | 3 refills | Status: DC
Start: 2022-09-09 — End: 2022-10-05

## 2022-09-09 NOTE — Telephone Encounter (Signed)
Pharmacy called and stated that a hard copy of the prescription for this med pregabalin (LYRICA) 150 MG capsule [[id number]] will need  to be faxed to pharmacy. Please advise  Fax for [facility]  [phone]

## 2022-09-11 ENCOUNTER — Telehealth: Payer: Self-pay

## 2022-09-11 NOTE — Telephone Encounter (Signed)
Derek [name] (Key: [id number])  Your information has been sent to [facility].

## 2022-09-15 ENCOUNTER — Encounter: Payer: No Typology Code available for payment source | Admitting: Family Medicine

## 2022-09-15 ENCOUNTER — Ambulatory Visit (INDEPENDENT_AMBULATORY_CARE_PROVIDER_SITE_OTHER): Payer: No Typology Code available for payment source | Admitting: Family Medicine

## 2022-09-15 ENCOUNTER — Encounter: Payer: Self-pay | Admitting: Family Medicine

## 2022-09-15 ENCOUNTER — Ambulatory Visit: Payer: BLUE CROSS/BLUE SHIELD

## 2022-09-15 VITALS — BP 120/72 | HR 87 | Temp 98.9°F | Ht 73.0 in | Wt 246.0 lb

## 2022-09-15 DIAGNOSIS — E663 Overweight: Secondary | ICD-10-CM

## 2022-09-15 DIAGNOSIS — E119 Type 2 diabetes mellitus without complications: Secondary | ICD-10-CM

## 2022-09-15 DIAGNOSIS — J40 Bronchitis, not specified as acute or chronic: Secondary | ICD-10-CM | POA: Diagnosis not present

## 2022-09-15 MED ORDER — HYDROCODONE BIT-HOMATROP MBR 5-1.5 MG/5ML PO SOLN: 5.0000 mL | Freq: Three times a day (TID) | ORAL | 0 refills | Status: AC | PRN

## 2022-09-15 MED ORDER — AZITHROMYCIN 250 MG PO TABS: ORAL_TABLET | ORAL | 0 refills | Status: AC

## 2022-09-15 MED ORDER — OZEMPIC (0.25 OR 0.5 MG/DOSE) 2 MG/3ML SC SOPN
.5 mg | SUBCUTANEOUS | 3 refills | Status: AC
Start: 2022-09-15 — End: ?

## 2022-09-15 NOTE — Progress Notes
Endocrinology New Patient Consult    NAME: Keith Cabrera  MRN: [id number]  DOB: 1960-12-16  Encounter Date: 09/15/2022  PCP: Erling Cruz., MD  Referring Physician: Janalyn Harder., MD    Chief Complaint   Patient presents with    New Consult     Uncontrolled type 2 diabetes mellitus with hyperglycemia       HPI:   Keith Cabrera is a 62 y.o. male here today for improved control Type 2 Diabetes Mellitus.  Notes has been on trulicity but has struggled with shortage.  Notes went to [place] for 17 days and was without it and did not have major impact on A1c.  Notes that he finds trulicity suppresses his appetite a little bit but that if he likes the food, he pushes through that.  Mostly adherent with other meds, although misses from time to time.  No complaints.  Last ophtho 2 m. ago    Past Medical History:   Diagnosis Date    Basal cell carcinoma     Bilateral ocular hypertension 09/15/2022    Colon polyp     Coronary artery disease     Diabetes mellitus, type 2 (HCC/RAF)     Hyperlipidemia     Hypertension        Past Surgical History:   Procedure Laterality Date    COLONOSCOPY N/A 02/21/2016    CORONARY STENT PLACEMENT  03/13/2013    SELECTIVE LASER TRABECULOPLASTY Bilateral     SKIN CANCER DESTRUCTION         Medications that the patient states to be currently taking   Medication Sig    aspirin 81 mg EC tablet Take 1 tablet (81 mg total) by mouth daily.    atorvastatin 80 mg tablet Take 1 tablet (80 mg total) by mouth daily.    Calcium Polycarbophil (FIBER) 625 MG TABS Take 625 mg by mouth daily as needed.    cyanocobalamin 1000 mcg tablet Take 1 tablet (1,000 mcg total) by mouth daily as needed.    empagliflozin (JARDIANCE) 25 mg tablet TAKE 1 TABLET(25 MG) BY MOUTH EVERY MORNING. (Patient taking differently: Take 1 tablet (25 mg total) by mouth every morning.)    ezetimibe 10 mg tablet Take 1 tablet (10 mg total) by mouth daily.    hydroCHLOROthiazide 25 mg tablet Take 1 tablet (25 mg total) by mouth daily.    losartan 100 mg tablet Take 1 tablet (100 mg total) by mouth daily.    metFORMIN (GLUCOPHAGE XR) 500 mg PO ER 24 hr tablet Take 2 tablets (1,000 mg total) by mouth two (2) times daily.    Omega-3 Fatty Acids (FISH OIL) 1000 mg capsule Take 1 capsule (1 g total) by mouth daily as needed.    ONETOUCH DELICA FINE lancets USE 1 LANCET TWICE A DAY (Patient taking differently: 1 Lancet by Does not apply route two (2) times daily.)    ONETOUCH ULTRA BLUE test strip USE 1 STRIP TWICE A DAY (Patient taking differently: 1 strip by use with Diabetic testing device route two (2) times daily.)    TRULICITY 0.75 MG/0.5ML pen Inject 0.5 mLs (0.75 mg total) under the skin.    vitamin E 100 unit capsule Take 1 capsule (45 mg total) by mouth daily as needed.       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 cigar   Vaping Use    Vaping status: Never Used   Substance and Sexual Activity    Alcohol use: Yes     Alcohol/week: 1.8 oz     Types: 3 Standard drinks or equivalent per week    Drug use: Not Currently     Frequency: 7.0 times per week     Types: Marijuana     Comment: used for years, age 66 reduced to only rare use   Social History Narrative    Born and Merchant navy officer       Physical Exam:  BP 118/76  ~ Pulse 76  ~ Temp 36.9 ?C (98.5 ?F) (Temporal)  ~ Resp 18  ~ Ht 5' 11'' (1.803 m)  ~ Wt 213 lb (96.6 kg)  ~ BMI 29.71 kg/m?        Gen: Well-developed, well nourished overweight, older caucasian male appearing stated age in NAD  HEENT: PERRL, OC/OP w/o lesions  Neck: no TM, no bruits  Lungs: CTA B  Heart: RRR, no MCR  Abd. Soft, NT, ND, (+) BS, no masses  Ext: no C/C/E, (+) hammer toes 2nd-4th on the left, 2nd and 4th on the right   Pulses: DP 2+ B  Neuro: grossly intact, sensation intact to 10 g MF BLE  Psych: normal mentation, normal affect    Review of Data:  I have:  [x]  Reviewed/ordered []  1 []  2 [x]  ? 3 unique laboratory, radiology, and/or diagnostic tests noted below     General/metabolic labs   Lab Results   Component Value Date    WBC 8.15 02/09/2022    HGB 15.8 02/09/2022    HCT 47.6 02/09/2022    MCV 95.4 02/09/2022    PLT 285 02/09/2022     Lab Results   Component Value Date    CREAT 0.92 08/06/2022    BUN 15 08/06/2022    NA 139 08/06/2022    K 4.3 08/06/2022    CL 102 08/06/2022    CO2 24 08/06/2022     Lab Results   Component Value Date    ALT 28 08/06/2022    AST 28 08/06/2022    ALKPHOS 64 08/06/2022    BILITOT 0.9 08/06/2022     Lab Results   Component Value Date    CHOL 170 08/06/2022    CHOLHDL 51 08/06/2022    CHOLDLCAL 74 08/06/2022    TRIGLY 226 (H) 08/06/2022      Thyroid labs  Lab Results   Component Value Date    TSH 2.0 12/06/2021     Bone labs  Lab Results   Component Value Date    CALCIUM 10.2 08/06/2022    ALBUMIN 4.7 08/06/2022      Diabetes labs  Lab Results   Component Value Date    HGBA1C 7.0 (  H) 08/06/2022       Lab Results   Component Value Date    GLUCOSE 123 (H) 08/06/2022    ALBCREATUR 7.3 08/06/2022    CHOLDLCAL 74 08/06/2022      [x]  Reviewed [x]  1 []  2 []  ? 3 prior external notes and incorporated into patient assessment   [x]  Independently interpreted studies as noted   []  Discussed management or test interpretation with external provider(s) as noted    Assessment and Plan:     Diagnoses and all orders for this visit:    Type 2 diabetes mellitus without complication, without long-term current use of insulin (HCC/RAF)  Patient with history of Type 2 Diabetes Mellitus and CAD.  Type 2 Diabetes Mellitus control is at upper limit of goal.  Already with hammer toes and ocular hypertension.  Stress for patient importance of adherence with all meds.    Most importantly, patient would likely benefit from switch from trulicity to ozempic due to risk reduction of CAD/CVA events.  Will change from trulicity to ozempic 0.5 mg weekly.  Patient advised to reach out with any intolerance or any other questions  -     semaglutide 0.25 or 0.5mg /dose (OZEMPIC) 2 mg/3 mL injection pen; Inject 0.5 mg under the skin once a week.    Mixed hyperlipidemia        Lipids mostly controlled with mild hypertriglyceridemia.  Continue atorvastatin and ezetimibe    Hypertension associated with diabetes (HCC/RAF)        Blood pressure controlled.  Continue current regimen    Overweight         Hopefully ozempic will have stronger effect on weight than trulicity.  Will follow.           Return in about 3 months (around 12/16/2022) for diabetes follow up.        Billing by Medical Decision Making meets requirements for 2/3 areas - Problem, Data, Risk:  Problem: MODERATE complexity - 2 or more stable chronic illnesses were addressed during this encounter.  Data Complexity: MODERATE complexity - review tests, order tests, review notes, independent historian (3/4) OR interpret test OR discuss with specialist [1/3 categories].   Risk: MODERATE risk - Prescription drug management.      Casimiro Needle A. Farra Nikolic, MD, Jalene Mullet 09/15/2022 @ [phone]
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
## 2022-09-22 ENCOUNTER — Other Ambulatory Visit: Payer: Self-pay | Admitting: Family Medicine

## 2022-09-22 DIAGNOSIS — M25511 Pain in right shoulder: Secondary | ICD-10-CM

## 2022-09-22 NOTE — Progress Notes (Signed)
Dg right shoulder

## 2022-09-23 ENCOUNTER — Ambulatory Visit
Admission: RE | Admit: 2022-09-23 | Discharge: 2022-09-23 | Disposition: A | Discharge status: Home / Self Care | Payer: Medicare Other | Source: Ambulatory Visit | Attending: Family Medicine | Admitting: Family Medicine

## 2022-09-23 DIAGNOSIS — M25511 Pain in right shoulder: Secondary | ICD-10-CM

## 2022-09-29 DIAGNOSIS — M25511 Pain in right shoulder: Secondary | ICD-10-CM | POA: Diagnosis not present

## 2022-10-05 ENCOUNTER — Other Ambulatory Visit: Payer: Self-pay | Admitting: Family Medicine

## 2022-10-05 DIAGNOSIS — M51369 Other intervertebral disc degeneration, lumbar region without mention of lumbar back pain or lower extremity pain: Secondary | ICD-10-CM

## 2022-10-05 DIAGNOSIS — D492 Neoplasm of unspecified behavior of bone, soft tissue, and skin: Secondary | ICD-10-CM

## 2022-10-05 DIAGNOSIS — G8929 Other chronic pain: Secondary | ICD-10-CM

## 2022-10-05 DIAGNOSIS — M5136 Other intervertebral disc degeneration, lumbar region: Secondary | ICD-10-CM

## 2022-10-05 MED ORDER — PREGABALIN 150 MG PO CAPS
ORAL_CAPSULE | ORAL | 3 refills | Status: AC
Start: 2022-10-05 — End: ?

## 2022-10-05 NOTE — Telephone Encounter (Signed)
Prescription Request  10/05/2022  LOV: 09/15/2022  What is the name of the medication or equipment?   pregabalin (LYRICA) 150 MG capsule **going on vacation tomorrow; requesting script to last 14 days**  Have you contacted your pharmacy to request a refill? Yes   Which pharmacy would you like this sent to?  [facility] [phone] - [place], [place] - [address] AT [facility] [address] [place] [address] Phone: [phone] Fax: [phone]    Patient notified that their request is being sent to the clinical staff for review and that they should receive a response within 2 business days.   Please advise patient when refill sent at [phone].

## 2022-10-06 ENCOUNTER — Other Ambulatory Visit: Payer: Self-pay | Admitting: Family Medicine

## 2022-10-06 NOTE — Telephone Encounter (Signed)
Script sent to pharmacy.

## 2022-10-07 NOTE — Telephone Encounter (Signed)
Requested Prescriptions  Pending Prescriptions Disp Refills   amLODipine (NORVASC) 10 MG tablet [Pharmacy Med Name: AMLODIPINE BESYLATE 10MG  TABLETS] 90 tablet 0    Sig: TAKE 1 TABLET(10 MG) BY MOUTH DAILY     Cardiovascular: Calcium Channel Blockers 2 Failed - 10/06/2022  5:09 PM      Failed - Valid encounter within last 6 months    Recent Outpatient Visits           1 year ago Pure hypercholesterolemia   [facility] Donita Brooks, MD   1 year ago Current moderate episode of major depressive disorder without prior episode (HCC)   [facility] Pickard, Priscille Heidelberg, MD   1 year ago Viral upper respiratory tract infection   [facility] Pickard, Priscille Heidelberg, MD   1 year ago Acute pain of right knee   [facility] Tanya Nones, Priscille Heidelberg, MD   1 year ago Need for immunization against influenza   [facility] Pickard, Priscille Heidelberg, MD              Passed - Last BP in normal range    BP Readings from Last 1 Encounters:  09/15/22 120/72         Passed - Last Heart Rate in normal range    Pulse Readings from Last 1 Encounters:  09/15/22 87

## 2022-10-12 ENCOUNTER — Telehealth: Payer: Self-pay

## 2022-10-12 NOTE — Telephone Encounter (Addendum)
PA-Wegovy  put on pcp's desk to be sign before faxing it back to pharmacy.    10/12/22 fax completed appeal PA forms back to plan.

## 2022-10-20 DIAGNOSIS — M25511 Pain in right shoulder: Secondary | ICD-10-CM | POA: Diagnosis not present

## 2022-10-24 ENCOUNTER — Other Ambulatory Visit: Payer: Self-pay | Admitting: Family Medicine

## 2022-10-25 ENCOUNTER — Other Ambulatory Visit: Payer: Self-pay | Admitting: Family Medicine

## 2022-10-25 DIAGNOSIS — J309 Allergic rhinitis, unspecified: Secondary | ICD-10-CM

## 2022-10-26 NOTE — Telephone Encounter (Signed)
Requested medication (s) are due for refill today: yes  Requested medication (s) are on the active medication list: yes  Last refill:  08/10/22  Future visit scheduled: no  Notes to clinic:  Unable to refill per protocol, cannot delegate.      Requested Prescriptions  Pending Prescriptions Disp Refills   cyclobenzaprine (FLEXERIL) 5 MG tablet [Pharmacy Med Name: CYCLOBENZAPRINE 5MG  TABLETS] 30 tablet     Sig: TAKE 1 TABLET(5 MG) BY MOUTH THREE TIMES DAILY AS NEEDED FOR MUSCLE SPASMS     Not Delegated - Analgesics:  Muscle Relaxants Failed - 10/24/2022 12:56 PM      Failed - This refill cannot be delegated      Failed - Valid encounter within last 6 months    Recent Outpatient Visits           1 year ago Pure hypercholesterolemia   [facility] Donita Brooks, MD   1 year ago Current moderate episode of major depressive disorder without prior episode (HCC)   [facility] Pickard, Priscille Heidelberg, MD   1 year ago Viral upper respiratory tract infection   [facility] Pickard, Priscille Heidelberg, MD   1 year ago Acute pain of right knee   [facility] Tanya Nones Priscille Heidelberg, MD   1 year ago Need for immunization against influenza   [facility] Pickard, Priscille Heidelberg, MD

## 2022-10-27 ENCOUNTER — Other Ambulatory Visit: Payer: Self-pay | Admitting: Family Medicine

## 2022-10-27 MED ORDER — CYCLOBENZAPRINE HCL 5 MG PO TABS: 5.0000 mg | ORAL_TABLET | Freq: Three times a day (TID) | ORAL | 1 refills | Status: AC | PRN

## 2022-10-29 ENCOUNTER — Other Ambulatory Visit: Payer: Self-pay | Admitting: Family Medicine

## 2022-11-03 ENCOUNTER — Other Ambulatory Visit: Payer: Self-pay | Admitting: Family Medicine

## 2022-11-03 NOTE — Telephone Encounter (Signed)
Requested medications are due for refill today.  yes  Requested medications are on the active medications list.  yes  Last refill. 04/10/2022 #30 5 rf  Future visit scheduled.   yes  Notes to clinic.  Refill not delegated.    Requested Prescriptions  Pending Prescriptions Disp Refills   zolpidem (AMBIEN) 10 MG tablet [Pharmacy Med Name: ZOLPIDEM 10MG  TABLETS] 30 tablet     Sig: TAKE 1 TABLET(10 MG) BY MOUTH AT BEDTIME     Not Delegated - Psychiatry:  Anxiolytics/Hypnotics Failed - 11/03/2022  7:13 AM      Failed - This refill cannot be delegated      Failed - Urine Drug Screen completed in last 360 days      Failed - Valid encounter within last 6 months    Recent Outpatient Visits           1 year ago Pure hypercholesterolemia   [facility] Donita Brooks, MD   1 year ago Current moderate episode of major depressive disorder without prior episode (HCC)   [facility] Pickard, Priscille Heidelberg, MD   1 year ago Viral upper respiratory tract infection   [facility] Medicine Donita Brooks, MD   1 year ago Acute pain of right knee   [facility] Tanya Nones Priscille Heidelberg, MD   1 year ago Need for immunization against influenza   [facility] Pickard, Priscille Heidelberg, MD

## 2022-11-13 ENCOUNTER — Ambulatory Visit: Payer: PRIVATE HEALTH INSURANCE

## 2022-11-13 DIAGNOSIS — E1169 Type 2 diabetes mellitus with other specified complication: Secondary | ICD-10-CM

## 2022-11-13 DIAGNOSIS — E1165 Type 2 diabetes mellitus with hyperglycemia: Secondary | ICD-10-CM

## 2022-11-13 DIAGNOSIS — E785 Hyperlipidemia, unspecified: Secondary | ICD-10-CM

## 2022-11-13 MED ORDER — SEMAGLUTIDE(0.25 OR 0.5MG/DOS) 2 MG/3ML SC SOPN
.5 mg | SUBCUTANEOUS | 0 refills | Status: AC
Start: 2022-11-13 — End: ?

## 2022-11-13 NOTE — Progress Notes
PROGRESS NOTE    Patient:  Keith Cabrera    Medical record number:  [id number]    Date of birth:  1960/10/09  Date of service:  11/13/2022      Chief complaint:    Chief Complaint   Patient presents with    Follow-up     3 Month       History of present illness:           Keith Cabrera is a 62 y.o. male          Who presents with the following history of present illness:    Here for DM F/u     Past medical history:           Patient Active Problem List   Diagnosis    Type 2 diabetes mellitus without complication (HCC/RAF)    History of coronary artery stent placement    Coronary artery disease involving native coronary artery without angina pectoris    Hypertension associated with diabetes (HCC/RAF)    Mixed hyperlipidemia    History of colonoscopy with polypectomy    Smokes cigars    Hyperlipidemia associated with type 2 diabetes mellitus (HCC/RAF)    Overweight    Bilateral ocular hypertension    History of basal cell carcinoma            Past Medical History:   Diagnosis Date    Basal cell carcinoma     Bilateral ocular hypertension 09/15/2022    Colon polyp     Coronary artery disease     Diabetes mellitus, type 2 (HCC/RAF)     Hyperlipidemia     Hypertension      Past surgical history:         Past Surgical History:   Procedure Laterality Date    COLONOSCOPY N/A 02/21/2016    CORONARY STENT PLACEMENT  03/13/2013    SELECTIVE LASER TRABECULOPLASTY Bilateral     SKIN CANCER DESTRUCTION         Medications:         Medications that the patient states to be currently taking   Medication Sig    aspirin 81 mg EC tablet Take 1 tablet (81 mg total) by mouth daily.    atorvastatin 80 mg tablet Take 1 tablet (80 mg total) by mouth daily.    Calcium Polycarbophil (FIBER) 625 MG TABS Take 625 mg by mouth daily as needed.    cyanocobalamin 1000 mcg tablet Take 1 tablet (1,000 mcg total) by mouth daily as needed.    empagliflozin (JARDIANCE) 25 mg tablet TAKE 1 TABLET(25 MG) BY MOUTH EVERY MORNING. (Patient taking differently: Take 1 tablet (25 mg total) by mouth every morning.)    ezetimibe 10 mg tablet Take 1 tablet (10 mg total) by mouth daily.    hydroCHLOROthiazide 25 mg tablet Take 1 tablet (25 mg total) by mouth daily.    losartan 100 mg tablet Take 1 tablet (100 mg total) by mouth daily.    metFORMIN (GLUCOPHAGE XR) 500 mg PO ER 24 hr tablet Take 2 tablets (1,000 mg total) by mouth two (2) times daily.    Omega-3 Fatty Acids (FISH OIL) 1000 mg capsule Take 1 capsule (1 g total) by mouth daily as needed.    ONETOUCH DELICA FINE lancets USE 1 LANCET TWICE A DAY (Patient taking differently: 1 Lancet by Does not apply route two (2) times daily.)    ONETOUCH ULTRA BLUE test strip USE 1 STRIP TWICE A  DAY (Patient taking differently: 1 strip by use with Diabetic testing device route two (2) times daily.)    semaglutide 0.25 or 0.5mg /dose (OZEMPIC) 2 mg/3 mL injection pen Inject 0.5 mg under the skin once a week.    vitamin E 100 unit capsule Take 1 capsule (45 mg total) by mouth daily as needed.       Allergy:       No Known Allergies    Family history:         Family History   Problem Relation Age of Onset    Alzheimer's disease Mother     Heart disease Father     No Known Problems Sister     Coronary artery disease Brother     No Known Problems Brother     Dementia Maternal Grandfather     No Known Problems Paternal Grandfather        Social history:         Social History     Tobacco Use    Smoking status: Some Days     Types: Cigars    Smokeless tobacco: Never    Tobacco comments:     occ. cigar   Substance Use Topics    Alcohol use: Yes     Alcohol/week: 1.8 oz     Types: 3 Standard drinks or equivalent per week                Latest Reference Range & Units 11/09/22 07:43   Sodium 135 - 146 mmol/L 135   Potassium 3.6 - 5.3 mmol/L 4.0   Chloride 96 - 106 mmol/L 100   Total CO2 20 - 30 mmol/L 22   Anion Gap 8 - 19 mmol/L 13   Urea Nitrogen 7 - 22 mg/dL 18   Creatinine 6.04 - 1.30 mg/dL 5.40   Estimated GFR See GFR Additional Information mL/min/1.16m2 >89   GFR Additional Information  See Comment   Glucose 65 - 99 mg/dL 981 (H)   Hemoglobin X9J <5.7 % 7.1 (H)   Calcium 8.6 - 10.4 mg/dL 9.3   Cholesterol See Comment mg/dL 478   Cholesterol, HDL >40 mg/dL 45   Cholesterol,LDL,Calc <100 mg/dL 59   Non-HDL,Chol,Calc <130 mg/dL 81   Triglycerides <295 mg/dL 621   AST (SGOT) 13 - 62 U/L 20   ALT (SGPT) 8 - 70 U/L 32   Alkaline Phosphatase 37 - 113 U/L 57   Bilirubin,Total 0.1 - 1.2 mg/dL 0.4   Albumin 3.9 - 5.0 g/dL 4.1   TOTAL PROTEIN 6.1 - 8.2 g/dL 6.8   (H): Data is abnormally high        PE:         BP 110/68  ~ Pulse 81  ~ Temp 36.6 ?C (97.8 ?F) (Tympanic)  ~ Resp 14  ~ Ht 5' 11'' (1.803 m)  ~ Wt 215 lb (97.5 kg)  ~ BMI 29.99 kg/m?           Constitutional:    Appearance:   Well nourished, well developed, in no acute distress  Psychiatric:   Mood normal, affect appropriate    Assessment/ Plan:    1. Uncontrolled type 2 diabetes mellitus with hyperglycemia (HCC/RAF)  - he has been taking Ozempic .25 and he didn't increase the dosage yet  - advise to increase to 0.5 mg  - low carb diet discussed in detail   - semaglutide 0.25 or 0.5mg /dose (OZEMPIC) 2 mg/3 mL SC injection pen; Inject 0.5 mg  under the skin once a week.  Dispense: 9 mL; Refill: 0  - Hgb A1c; Future    2. Hyperlipidemia associated with type 2 diabetes mellitus (HCC/RAF)  - stable   - Lipid Panel; Future    3. Hypertension associated with diabetes (HCC/RAF)  - stable   - Comprehensive Metabolic Panel; Future      There are no Patient Instructions on file for this visit.     The above recommendation were discussed with the patient.  The patient has all questions answered satisfactorily and is in agreement with this recommended plan of care.    Author:       Erling Cruz, MD  8:36 AM      Future Appointments         Provider Department Visit Type    12/18/2022 4:00 PM Polisky, Tyrone Apple., MD, [facility] - Endocrinology [place] RETURN    02/12/2023 8:45 AM Malyk Girouard, Wylie Hail., MD [facility] MPT[facility] RETURN

## 2022-11-16 ENCOUNTER — Other Ambulatory Visit: Payer: Self-pay | Admitting: Family Medicine

## 2022-11-17 NOTE — Telephone Encounter (Signed)
Requested Prescriptions  Pending Prescriptions Disp Refills   ezetimibe (ZETIA) 10 MG tablet [Pharmacy Med Name: EZETIMIBE 10MG  TABLETS] 90 tablet 2    Sig: TAKE 1 TABLET(10 MG) BY MOUTH DAILY     Cardiovascular:  Antilipid - Sterol Transport Inhibitors Failed - 11/16/2022 10:02 AM      Failed - Valid encounter within last 12 months    Recent Outpatient Visits           1 year ago Pure hypercholesterolemia   [facility] Pickard, Priscille Heidelberg, MD   1 year ago Current moderate episode of major depressive disorder without prior episode (HCC)   [facility] Pickard, Priscille Heidelberg, MD   1 year ago Viral upper respiratory tract infection   [facility] Pickard, Priscille Heidelberg, MD   1 year ago Acute pain of right knee   [facility] Pickard, Priscille Heidelberg, MD   1 year ago Need for immunization against influenza   [facility] Donita Brooks, MD              Failed - Lipid Panel in normal range within the last 12 months    Cholesterol, Total  Date Value Ref Range Status  12/22/2017 163 100 - 199 mg/dL Final   Cholesterol  Date Value Ref Range Status  08/10/2022 185 <200 mg/dL Final   LDL Cholesterol (Calc)  Date Value Ref Range Status  08/10/2022 100 (H) mg/dL (calc) Final    Comment:    Reference range: <100 . Desirable range <100 mg/dL for primary prevention;   <70 mg/dL for patients with CHD or diabetic patients  with > or = 2 CHD risk factors. [name] Kitchen LDL-C is now calculated using the Martin-Hopkins  calculation, which is a validated novel method providing  better accuracy than the Friedewald equation in the  estimation of LDL-C.  Horald Pollen et al. Lenox Ahr. 9811;914(78): 2061-2068  (http://education.[employer].com/faq/FAQ164)    HDL  Date Value Ref Range Status  08/10/2022 54 > OR = 40 mg/dL Final  29/56/2130 32 (L) >39 mg/dL Final   Triglycerides  Date Value Ref Range Status  08/10/2022 191 (H) <150  mg/dL Final         Passed - AST in normal range and within 360 days    AST  Date Value Ref Range Status  08/10/2022 28 10 - 35 U/L Final         Passed - ALT in normal range and within 360 days    ALT  Date Value Ref Range Status  08/10/2022 37 9 - 46 U/L Final         Passed - Patient is not pregnant

## 2022-11-20 ENCOUNTER — Other Ambulatory Visit: Payer: Self-pay | Admitting: Family Medicine

## 2022-11-20 NOTE — Telephone Encounter (Signed)
Requested medication (s) are due for refill today - yes  Requested medication (s) are on the active medication list -yes  Future visit scheduled -no  Last refill: 08/25/22 30g  Notes to clinic: duplicate request- non delegated Rx  Requested Prescriptions  Pending Prescriptions Disp Refills   triamcinolone cream (KENALOG) 0.1 % [Pharmacy Med Name: TRIAMCINOLONE 0.1% CREAM   30GM] 30 g 0    Sig: APPLY TOPICALLY TO THE AFFECTED AREA TWICE DAILY     Not Delegated - Dermatology:  Corticosteroids Failed - 11/20/2022  9:19 AM      Failed - This refill cannot be delegated      Failed - Valid encounter within last 12 months    Recent Outpatient Visits           1 year ago Pure hypercholesterolemia   [facility] Donita Brooks, MD   1 year ago Current moderate episode of major depressive disorder without prior episode (HCC)   [facility] Pickard, Priscille Heidelberg, MD   1 year ago Viral upper respiratory tract infection   [facility] Pickard, Priscille Heidelberg, MD   1 year ago Acute pain of right knee   [facility] Tanya Nones, Priscille Heidelberg, MD   1 year ago Need for immunization against influenza   [facility] Pickard, Priscille Heidelberg, MD                 Requested Prescriptions  Pending Prescriptions Disp Refills   triamcinolone cream (KENALOG) 0.1 % [Pharmacy Med Name: TRIAMCINOLONE 0.1% CREAM   30GM] 30 g 0    Sig: APPLY TOPICALLY TO THE AFFECTED AREA TWICE DAILY     Not Delegated - Dermatology:  Corticosteroids Failed - 11/20/2022  9:19 AM      Failed - This refill cannot be delegated      Failed - Valid encounter within last 12 months    Recent Outpatient Visits           1 year ago Pure hypercholesterolemia   [facility] Donita Brooks, MD   1 year ago Current moderate episode of major depressive disorder without prior episode (HCC)   [facility] Pickard, Priscille Heidelberg, MD   1  year ago Viral upper respiratory tract infection   [facility] Pickard, Priscille Heidelberg, MD   1 year ago Acute pain of right knee   [facility] Tanya Nones Priscille Heidelberg, MD   1 year ago Need for immunization against influenza   [facility] Pickard, Priscille Heidelberg, MD

## 2022-11-23 NOTE — Telephone Encounter (Signed)
Pharmacy sent second refill request for triamcinolone cream (KENALOG) 0.1 %   LOV: 09/15/2022 and 08/25/2022  Please advise pharmacist.  [facility] [phone] - [place], [place] - [address] AT [facility] [address], [place] [address] Phone: [phone]  Fax: [phone] DEA #: [id number]

## 2022-11-24 ENCOUNTER — Ambulatory Visit (INDEPENDENT_AMBULATORY_CARE_PROVIDER_SITE_OTHER): Payer: No Typology Code available for payment source | Admitting: Neurology

## 2022-11-24 ENCOUNTER — Telehealth: Payer: Self-pay

## 2022-11-24 ENCOUNTER — Encounter: Payer: Self-pay | Admitting: Neurology

## 2022-11-24 VITALS — BP 122/76 | HR 76 | Ht 72.0 in | Wt 249.5 lb

## 2022-11-24 DIAGNOSIS — G4733 Obstructive sleep apnea (adult) (pediatric): Secondary | ICD-10-CM | POA: Diagnosis not present

## 2022-11-24 DIAGNOSIS — G43709 Chronic migraine without aura, not intractable, without status migrainosus: Secondary | ICD-10-CM | POA: Diagnosis not present

## 2022-11-24 NOTE — Patient Instructions (Signed)
Continue CPAP, use nightly for minimum of 4 hours.  Discuss heat settings with your DME Call for any headaches

## 2022-11-24 NOTE — Telephone Encounter (Signed)
Community message sent via epic for cpap orders
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
## 2022-11-25 NOTE — Telephone Encounter (Signed)
Pharmacy sent second refill request for triamcinolone cream (KENALOG) 0.1 %   Please see previous messages in thread.

## 2022-11-29 ENCOUNTER — Other Ambulatory Visit: Payer: Self-pay | Admitting: Family Medicine

## 2022-12-18 ENCOUNTER — Ambulatory Visit: Payer: PRIVATE HEALTH INSURANCE

## 2023-01-05 DIAGNOSIS — I10 Essential (primary) hypertension: Secondary | ICD-10-CM | POA: Diagnosis not present

## 2023-01-05 DIAGNOSIS — E785 Hyperlipidemia, unspecified: Secondary | ICD-10-CM | POA: Diagnosis not present

## 2023-01-05 DIAGNOSIS — Z131 Encounter for screening for diabetes mellitus: Secondary | ICD-10-CM | POA: Diagnosis not present

## 2023-01-05 DIAGNOSIS — R5383 Other fatigue: Secondary | ICD-10-CM | POA: Diagnosis not present

## 2023-01-05 DIAGNOSIS — Z125 Encounter for screening for malignant neoplasm of prostate: Secondary | ICD-10-CM | POA: Diagnosis not present

## 2023-02-02 DIAGNOSIS — R059 Cough, unspecified: Secondary | ICD-10-CM | POA: Diagnosis not present

## 2023-02-02 DIAGNOSIS — J069 Acute upper respiratory infection, unspecified: Secondary | ICD-10-CM | POA: Diagnosis not present

## 2023-02-10 MED ORDER — ATORVASTATIN CALCIUM 80 MG PO TABS
80 mg | ORAL_TABLET | Freq: Every day | ORAL | 3 refills | Status: AC
Start: 2023-02-10 — End: ?

## 2023-02-10 MED ORDER — JARDIANCE 25 MG PO TABS
ORAL_TABLET | 3 refills | Status: AC
Start: 2023-02-10 — End: ?

## 2023-02-12 ENCOUNTER — Ambulatory Visit: Payer: PRIVATE HEALTH INSURANCE

## 2023-02-12 DIAGNOSIS — Z23 Encounter for immunization: Secondary | ICD-10-CM

## 2023-02-12 DIAGNOSIS — E1165 Type 2 diabetes mellitus with hyperglycemia: Secondary | ICD-10-CM

## 2023-02-12 DIAGNOSIS — L600 Ingrowing nail: Secondary | ICD-10-CM

## 2023-02-12 DIAGNOSIS — Z1211 Encounter for screening for malignant neoplasm of colon: Secondary | ICD-10-CM

## 2023-02-12 MED ORDER — SEMAGLUTIDE (1 MG/DOSE) 4 MG/3ML SC SOPN
1 mg | SUBCUTANEOUS | 0 refills | Status: AC
Start: 2023-02-12 — End: ?

## 2023-02-12 NOTE — Progress Notes
PROGRESS NOTE    Patient:  Keith Cabrera    Medical record number:  [id number]    Date of birth:  Nov 23, 1960  Date of service:  02/12/2023      Chief complaint:  No chief complaint on file.      History of present illness:           Keith Cabrera is a 62 y.o. male          Who presents with the following history of present illness:    Here for DM f/u     Past medical history:           Patient Active Problem List   Diagnosis    Type 2 diabetes mellitus without complication (HCC/RAF)    History of coronary artery stent placement    Coronary artery disease involving native coronary artery without angina pectoris    Hypertension associated with diabetes (HCC/RAF)    Mixed hyperlipidemia    History of colonoscopy with polypectomy    Smokes cigars    Hyperlipidemia associated with type 2 diabetes mellitus (HCC/RAF)    Overweight    Bilateral ocular hypertension    History of basal cell carcinoma            Past Medical History:   Diagnosis Date    Basal cell carcinoma     Bilateral ocular hypertension 09/15/2022    Colon polyp     Coronary artery disease     Diabetes mellitus, type 2 (HCC/RAF)     Hyperlipidemia     Hypertension      Past surgical history:         Past Surgical History:   Procedure Laterality Date    COLONOSCOPY N/A 02/21/2016    CORONARY STENT PLACEMENT  03/13/2013    SELECTIVE LASER TRABECULOPLASTY Bilateral     SKIN CANCER DESTRUCTION         Medications:         Medications that the patient states to be currently taking   Medication Sig    aspirin 81 mg EC tablet Take 1 tablet (81 mg total) by mouth daily.    ATORVASTATIN 80 mg tablet TAKE 1 TABLET(80 MG) BY MOUTH DAILY    Calcium Polycarbophil (FIBER) 625 MG TABS Take 625 mg by mouth daily as needed.    cyanocobalamin 1000 mcg tablet Take 1 tablet (1,000 mcg total) by mouth daily as needed.    ezetimibe 10 mg tablet Take 1 tablet (10 mg total) by mouth daily.    hydroCHLOROthiazide 25 mg tablet Take 1 tablet (25 mg total) by mouth daily. JARDIANCE 25 MG tablet TAKE 1 TABLET(25 MG) BY MOUTH EVERY MORNING    losartan 100 mg tablet Take 1 tablet (100 mg total) by mouth daily.    metFORMIN (GLUCOPHAGE XR) 500 mg PO ER 24 hr tablet Take 2 tablets (1,000 mg total) by mouth two (2) times daily.    Omega-3 Fatty Acids (FISH OIL) 1000 mg capsule Take 1 capsule (1 g total) by mouth daily as needed.    ONETOUCH DELICA FINE lancets USE 1 LANCET TWICE A DAY (Patient taking differently: 1 Lancet by Does not apply route two (2) times daily.)    ONETOUCH ULTRA BLUE test strip USE 1 STRIP TWICE A DAY (Patient taking differently: 1 strip by use with Diabetic testing device route two (2) times daily.)    vitamin E 100 unit capsule Take 1 capsule (45 mg total) by mouth daily  as needed.    [DISCONTINUED] semaglutide 0.25 or 0.5mg /dose (OZEMPIC) 2 mg/3 mL injection pen Inject 0.5 mg under the skin once a week.    [DISCONTINUED] semaglutide 0.25 or 0.5mg /dose (OZEMPIC) 2 mg/3 mL SC injection pen Inject 0.5 mg under the skin once a week.       Allergy:       No Known Allergies    Family history:         Family History   Problem Relation Age of Onset    Alzheimer's disease Mother     Heart disease Father     No Known Problems Sister     Coronary artery disease Brother     No Known Problems Brother     Dementia Maternal Grandfather     No Known Problems Paternal Grandfather        Social history:         Social History     Tobacco Use    Smoking status: Some Days     Types: Cigars    Smokeless tobacco: Never    Tobacco comments:     occ. cigar   Substance Use Topics    Alcohol use: Yes     Alcohol/week: 1.8 oz     Types: 3 Standard drinks or equivalent per week     Lab Results   Component Value Date    WBC 8.15 02/09/2022    HGB 15.8 02/09/2022    HCT 47.6 02/09/2022    MCV 95.4 02/09/2022    PLT 285 02/09/2022     Lab Results   Component Value Date    CREAT 0.87 01/29/2023    BUN 15 01/29/2023    NA 140 01/29/2023    K 4.4 01/29/2023    CL 106 01/29/2023    CO2 22 01/29/2023     Lab Results   Component Value Date    HGBA1C 6.7 (H) 01/29/2023     Lab Results   Component Value Date    ALT 33 01/29/2023    AST 24 01/29/2023    ALKPHOS 54 01/29/2023    BILITOT 0.5 01/29/2023     Lab Results   Component Value Date    TSH 2.0 12/06/2021     Lab Results   Component Value Date    CALCIUM 9.3 01/29/2023     Lab Results   Component Value Date    CHOL 101 01/29/2023    CHOLHDL 44 01/29/2023    CHOLDLCAL 41 01/29/2023    TRIGLY 81 01/29/2023     No components found for: ''PSA''                  PE:         BP 120/71  ~ Pulse 64  ~ Temp 36.5 ?C (97.7 ?F) (Tympanic)  ~ Resp 14  ~ Ht 5' 11'' (1.803 m)  ~ Wt 214 lb (97.1 kg)  ~ BMI 29.85 kg/m?           Constitutional:    Appearance:   Well nourished, well developed, in no acute distress  Psychiatric:   Mood normal, affect appropriate    Assessment/ Plan:    1. Type 2 diabetes mellitus with hyperglycemia, unspecified whether long term insulin use (HCC/RAF)  -improving   - will increase from 0.5 mg to semaglutide 1 mg/dose (OZEMPIC) 4 mg/3 mL SC injection pen; Inject 1 mg under the skin once a week.  Dispense: 9 mL; Refill: 0  -  Comprehensive Metabolic Panel; Future  - Hgb A1c; Future    2. Hyperlipidemia associated with type 2 diabetes mellitus (HCC/RAF)  - stable     3. Hypertension associated with diabetes (HCC/RAF)  - stable   - Lipid Panel; Future    4. Colon cancer screening  - **[facility] REFERRAL TO GASTROENTEROLOGY    5. Ingrown toenail  - Referral to Podiatry    6. Need for COVID-19 vaccine  - COVID-19 vaccine IM (Pfizer) 12 yr+      There are no Patient Instructions on file for this visit.     The above recommendation were discussed with the patient.  The patient has all questions answered satisfactorily and is in agreement with this recommended plan of care.    Author:       Erling Cruz, MD  10:15 AM      Future Appointments         Provider Department Visit Type    06/04/2023 8:30 AM Germaine Shenker, Wylie Hail., MD [facility] MPTF [facility] RETURN

## 2023-02-19 MED ORDER — HYDROCHLOROTHIAZIDE 25 MG PO TABS
25 mg | ORAL_TABLET | Freq: Every day | ORAL | 3 refills
Start: 2023-02-19 — End: ?

## 2023-03-01 DIAGNOSIS — G629 Polyneuropathy, unspecified: Secondary | ICD-10-CM | POA: Diagnosis not present

## 2023-03-17 ENCOUNTER — Ambulatory Visit: Payer: PRIVATE HEALTH INSURANCE

## 2023-03-17 DIAGNOSIS — Z0100 Encounter for examination of eyes and vision without abnormal findings: Secondary | ICD-10-CM

## 2023-03-17 DIAGNOSIS — E119 Type 2 diabetes mellitus without complications: Secondary | ICD-10-CM

## 2023-03-22 ENCOUNTER — Other Ambulatory Visit: Payer: Self-pay | Admitting: Family Medicine

## 2023-04-09 ENCOUNTER — Other Ambulatory Visit: Payer: PRIVATE HEALTH INSURANCE

## 2023-04-09 ENCOUNTER — Telehealth: Payer: PRIVATE HEALTH INSURANCE

## 2023-04-09 NOTE — Telephone Encounter
Call Back Request      Reason for call back: Patient was referred to [facility]. Please change to somewhere in [facility] for patient.   Please call patient when addressed.   Any Symptoms:  []  Yes  [x]  No      If yes, what symptoms are you experiencing:    Duration of symptoms (how long):    Have you taken medication for symptoms (OTC or Rx):      If call was taken outside of clinic hours:    [] Patient or caller has been notified that this message was sent outside of normal clinic hours.     [] Patient or caller has been warm transferred to the physician's answering service. If applicable, patient or caller informed to please call us back if symptoms progress.  Patient or caller has been notified of the turnaround time of 1-2 business day(s).

## 2023-04-19 ENCOUNTER — Telehealth: Payer: Self-pay | Admitting: Family Medicine

## 2023-04-19 NOTE — Telephone Encounter (Signed)
 Prescription Request  04/19/2023  LOV: 09/15/2022  What is the name of the medication or equipment?   omeprazole  (PRILOSEC) 40 MG capsule   valsartan  (DIOVAN ) 320 MG tablet   ezetimibe  (ZETIA ) 10 MG tablet   buPROPion  (WELLBUTRIN  XL) 150 MG 24 hr tablet  **new scripts requestedTHORA Jain Order #[id number] **NCPDP: [id number]  Have you contacted your pharmacy to request a refill? Yes   Which pharmacy would you like this sent to?  [facility] - [place], [place] - [address] [place] [address] Phone: [phone] Fax: [phone]    Patient notified that their request is being sent to the clinical staff for review and that they should receive a response within 2 business days.   Please advise pharmacist

## 2023-04-20 ENCOUNTER — Other Ambulatory Visit: Payer: Self-pay

## 2023-04-20 DIAGNOSIS — E7800 Pure hypercholesterolemia, unspecified: Secondary | ICD-10-CM

## 2023-04-20 DIAGNOSIS — K219 Gastro-esophageal reflux disease without esophagitis: Secondary | ICD-10-CM

## 2023-04-20 DIAGNOSIS — I10 Essential (primary) hypertension: Secondary | ICD-10-CM

## 2023-04-20 DIAGNOSIS — F411 Generalized anxiety disorder: Secondary | ICD-10-CM

## 2023-04-20 MED ORDER — BUPROPION HCL ER (XL) 150 MG PO TB24
150.0000 mg | ORAL_TABLET | Freq: Every day | ORAL | 1 refills | Status: DC
Start: 2023-04-20 — End: 2024-02-02

## 2023-04-20 MED ORDER — EZETIMIBE 10 MG PO TABS
10.0000 mg | ORAL_TABLET | Freq: Every day | ORAL | 1 refills | Status: DC
Start: 2023-04-20 — End: 2023-10-11

## 2023-04-20 MED ORDER — OMEPRAZOLE 40 MG PO CPDR
40.0000 mg | DELAYED_RELEASE_CAPSULE | Freq: Every day | ORAL | 1 refills | Status: DC
Start: 2023-04-20 — End: 2023-06-21

## 2023-04-20 MED ORDER — VALSARTAN 320 MG PO TABS
320.0000 mg | ORAL_TABLET | Freq: Every day | ORAL | 1 refills | Status: DC
Start: 2023-04-20 — End: 2023-10-13

## 2023-04-21 ENCOUNTER — Telehealth: Payer: PRIVATE HEALTH INSURANCE

## 2023-04-21 NOTE — Telephone Encounter
Call Back Request      Reason for call back: The pt wants Dorene Grebe to know he selected the [facility] podiatry and he needs his referral changed.    Any Symptoms:  []  Yes  [x]  No      If yes, what symptoms are you experiencing:    Duration of symptoms (how long):    Have you taken medication for symptoms (OTC or Rx):      If call was taken outside of clinic hours:    [] Patient or caller has been notified that this message was sent outside of normal clinic hours.     [] Patient or caller has been warm transferred to the physician's answering service. If applicable, patient or caller informed to please call us back if symptoms progress.  Patient or caller has been notified of the turnaround time of 1-2 business day(s).

## 2023-04-21 NOTE — Telephone Encounter
Called pt no answer, lvm informing pt a message was sent to our [facility] to have his podiatry referral updated to the [facility] office

## 2023-05-03 DIAGNOSIS — K7689 Other specified diseases of liver: Secondary | ICD-10-CM | POA: Diagnosis not present

## 2023-05-14 ENCOUNTER — Other Ambulatory Visit: Payer: BLUE CROSS/BLUE SHIELD

## 2023-05-14 ENCOUNTER — Telehealth: Payer: BLUE CROSS/BLUE SHIELD

## 2023-05-14 ENCOUNTER — Inpatient Hospital Stay: Payer: BLUE CROSS/BLUE SHIELD

## 2023-05-14 ENCOUNTER — Ambulatory Visit: Payer: BLUE CROSS/BLUE SHIELD

## 2023-05-14 DIAGNOSIS — Z1211 Encounter for screening for malignant neoplasm of colon: Secondary | ICD-10-CM

## 2023-05-14 MED ORDER — SUTAB 1479-225-188 MG PO TABS
ORAL_TABLET | 0 refills | Status: AC
Start: 2023-05-14 — End: ?

## 2023-05-14 NOTE — Telephone Encounter
 Lvm to schedule procedure with Dr Donnamarie Poag.

## 2023-05-14 NOTE — Patient Instructions
-  schedule colonoscopy  -hold ozempic 1 week prior to his colonoscopy  -hold jardiance 3 days prior to colonoscopy  -hold metformin the day of his colonoscopy

## 2023-05-14 NOTE — Telephone Encounter
MPU Checklist    Procedure Scheduled:   Colonoscopy    Prep instructions have been delivered to patient via:   Mychart     Encounter sent to FCU team if the following applies:   N/A    Sedation Method:   MAC- MAC script was given     MD/PREP: (open access pts)   Clinic Patient- N/A - (Sign encounter and do not route)

## 2023-05-14 NOTE — Consults
 Gastroenterology Consult   ATTENDING: Lorin Picket A. Blaike Vickers   PATIENT: Keith Cabrera  MRN: [id number]  DOB: 02-May-1960  DATE OF SERVICE: 05/14/2023  REFERRING PROVIDER: Erling Cruz., MD  PRIMARY CARE PROVIDER: Erling Cruz., MD    REASON FOR REFERRAL:   Chief Complaint   Patient presents with    Colon Cancer Screening        Subjective:     Chief Complaint:  Keith Cabrera is a 63 y.o. male with PMHx DM type 2, HTN, HL, CAD who presents for scheduling of a screening colonoscopy. The patient denies any family hx of colon cancer, recent changes in bowel habits, blood in his stool or any unintentional weight loss. Patient denies any hx of OSA requiring CPAP or taking any blood thinners.     Past Medical & Surgical History:  Patient Active Problem List   Diagnosis    Type 2 diabetes mellitus without complication (HCC/RAF)    History of coronary artery stent placement    Coronary artery disease involving native coronary artery without angina pectoris    Hypertension associated with diabetes (HCC/RAF)    Mixed hyperlipidemia    History of colonoscopy with polypectomy    Smokes cigars    Hyperlipidemia associated with type 2 diabetes mellitus (HCC/RAF)    Overweight    Bilateral ocular hypertension    History of basal cell carcinoma      Past Medical History:   Diagnosis Date    Basal cell carcinoma     Bilateral ocular hypertension 09/15/2022    Colon polyp     Coronary artery disease     Diabetes mellitus, type 2 (HCC/RAF)     Hyperlipidemia     Hypertension      Past Surgical History:   Procedure Laterality Date    COLONOSCOPY N/A 02/21/2016    CORONARY STENT PLACEMENT  03/13/2013    SELECTIVE LASER TRABECULOPLASTY Bilateral     SKIN CANCER DESTRUCTION         Relevant Family History:  (yes if checked)  []   Family history of colorectal cancer - none  []   Family history of GI cancer (not CRC)   []   Family history of IBD   []   Family history of celiac disease    Relevant Social History:  EtOH 4-5 drinks/week; Tobacco smokes cigars occasionally; Drugs: denies      MEDICATIONS:     Medications that the patient states to be currently taking   Medication Sig    aspirin 81 mg EC tablet Take 1 tablet (81 mg total) by mouth daily.    ATORVASTATIN 80 mg tablet TAKE 1 TABLET(80 MG) BY MOUTH DAILY    Calcium Polycarbophil (FIBER) 625 MG TABS Take 625 mg by mouth daily as needed.    cyanocobalamin 1000 mcg tablet Take 1 tablet (1,000 mcg total) by mouth daily as needed.    ezetimibe 10 mg tablet Take 1 tablet (10 mg total) by mouth daily.    hydroCHLOROthiazide 25 mg tablet Take 1 tablet (25 mg total) by mouth daily.    JARDIANCE 25 MG tablet TAKE 1 TABLET(25 MG) BY MOUTH EVERY MORNING    losartan 100 mg tablet Take 1 tablet (100 mg total) by mouth daily.    metFORMIN (GLUCOPHAGE XR) 500 mg PO ER 24 hr tablet Take 2 tablets (1,000 mg total) by mouth two (2) times daily.    Omega-3 Fatty Acids (FISH OIL) 1000 mg capsule Take 1 capsule (1 g total)  by mouth daily as needed.    ONETOUCH DELICA FINE lancets USE 1 LANCET TWICE A DAY (Patient taking differently: 1 Lancet by Does not apply route two (2) times daily.)    ONETOUCH ULTRA BLUE test strip USE 1 STRIP TWICE A DAY (Patient taking differently: 1 strip by use with Diabetic testing device route two (2) times daily.)    semaglutide 1 mg/dose (OZEMPIC) 4 mg/3 mL SC injection pen Inject 1 mg under the skin once a week.    vitamin E 100 unit capsule Take 1 capsule (45 mg total) by mouth daily as needed.         Allergies :    has No Known Allergies.    PHYSICAL EXAM      Last Recorded Vital Signs:    05/14/23 0905   BP: 126/68   Pulse: 76   Temp: 36.3 ?C (97.3 ?F)   SpO2: 94%      Body mass index is 30.49 kg/m?[name] Kitchen    System Check if examined and normal Positive or additional negative findings   Constit  [x]  General appearance - normal     Eyes  [x]  Conjuctivae clear       HENMT  [x]  Normal Lips/teeth/gums, oropharynx, head     Neck  []  Inspection/palpation; thyroid normal      Resp  []  Effort good. Clear to auscultation bilaterally.     CV  []  Normal Rhythm/rate without murmur     Abdomen  []  Abdomen soft and NT, no rebound/guarding   []  Active BS 4 quadrants   []  No appreciable flank or shifting dullness   []  No hepatosplenomegaly   []  No umbilical hernia    Rectal   []  No external hemorrhoids   []  No masses palpated on DRE   []  Brown stool in vault   []   Appropriate resting and squeeze pressure   []   Appropriate bear down maneuver      MSK  [x]  Grossly normal strength, ROM     Neuro   [x]  Grossly normal      Psychiatric  [x]   Oriented to time, place and person   [x]   Appropriate insight/judgement & mood/affect                Lab Review:  Lab Results   Component Value Date    WBC 8.15 02/09/2022    HGB 15.8 02/09/2022    HCT 47.6 02/09/2022    MCV 95.4 02/09/2022    PLT 285 02/09/2022     Lab Results   Component Value Date    CREAT 0.87 01/29/2023    BUN 15 01/29/2023    NA 140 01/29/2023    K 4.4 01/29/2023    CL 106 01/29/2023    CO2 22 01/29/2023    ALT 33 01/29/2023    AST 24 01/29/2023    ALKPHOS 54 01/29/2023    BILITOT 0.5 01/29/2023    ALBUMIN 4.3 01/29/2023     No results found for: ''SRWEST'', ''CRP'', ''GLIADINIGA'', ''GLIADINIGG'', ''ENDOMAB'', ''TRNGLUTIGAAB'', ''IGASER''  No results found for: ''INR'', ''FE'', ''FEBINDSAT'', ''TIBC'', ''FERRITIN'', ''VITAMINB12'', ''FOLATE''  No results found for: ''HELPYLAB'', ''HELPYLAGSTL''      Imaging:  none     GI Studies:  Colonoscopy 02/21/2016- normal     Medical Decision Making addressed on 05/14/2023:      Number and Complexity of Problems Addressed at the Encounter:   []   2 or more self-limited or minor problems     []   1 stable chronic illness  [x]   1 acute, uncomplicated illness or injury     Review of Data: I have  []  Reviewed/ordered >= 2 unique laboratory, radiology, and/or diagnostic tests noted    [x]  Reviewed prior external notes and incorporated into patient assessment as noted  []  Assessment required an independent historian, specifically ___      Risk of Complication and or Morbidity or Mortality of Patient Management:  [x]   I deem the above diagnoses to have a risk of complication, morbidity or mortality of Low      Visit Diagnoses  / Problems addressed on 05/14/2023:     Encounter Diagnosis   Name Primary?    Colon cancer screening Yes          Assessment & Plan :   Keith Cabrera is a 63 y.o. male with PMHx DM type 2, HTN, HL, CAD who presents for scheduling of a screening colonoscopy.    Colon cancer screening  -instructed patient to hold ozempic 1 week prior to his colonoscopy  -instructed patient to hold jardiance 3 days prior to colonoscopy  -instructed patient to hold metformin the day of his colonoscopy  The risks of the procedure, alternatives tests / procedures, procedural details, bowel preparation involved, and need to bring a friend or family member to the appointment were explained to the patient in detail.  The patient understands that colorectal cancer could be missed if the procedure is not done and would like to proceed.  The colonoscopy will be scheduled with IV MAC and the bowel prep to be used will be Sutab.                The above recommendation were discussed with the patient. The patient has all questions answered satisfactorily and is in agreement with this recommended plan of care.    Graceyn Fodor A. Emiko Osorto, MD   05/14/2023 9:10 AM      Level 3 billing based on time:  30 min were spent on this patient visit included physician face-to-face time, preparing to see the patient, counseling and educating patient/family/caregiver, ordering medications/tests/procedures, referring and communicating with other healthcare professionals, documenting clinical information in the EHR and independently interpreting results and communicating results to patient/family/caregiver.      New patient: [id number] 30-44 min  Established patient: [id number] 20-29 min

## 2023-06-04 ENCOUNTER — Ambulatory Visit: Payer: BLUE CROSS/BLUE SHIELD

## 2023-06-04 DIAGNOSIS — E1165 Type 2 diabetes mellitus with hyperglycemia: Secondary | ICD-10-CM

## 2023-06-04 DIAGNOSIS — Z125 Encounter for screening for malignant neoplasm of prostate: Secondary | ICD-10-CM

## 2023-06-04 NOTE — Progress Notes
 PROGRESS NOTE    Patient:  Keith Cabrera    Medical record number:  [id number]    Date of birth:  January 19, 1961  Date of service:  06/04/2023      Chief complaint:    Chief Complaint   Patient presents with    Follow-up       History of present illness:           Keith Cabrera is a 63 y.o. male          Who presents with the following history of present illness:    He missed a dose in Feb  Has stayed away from alcohol since alcohol     Past medical history:           Patient Active Problem List   Diagnosis    Type 2 diabetes mellitus without complication (HCC/RAF)    History of coronary artery stent placement    Coronary artery disease involving native coronary artery without angina pectoris    Hypertension associated with diabetes (HCC/RAF)    Mixed hyperlipidemia    History of colonoscopy with polypectomy    Smokes cigars    Hyperlipidemia associated with type 2 diabetes mellitus (HCC/RAF)    Overweight    Bilateral ocular hypertension    History of basal cell carcinoma            Past Medical History:   Diagnosis Date    Basal cell carcinoma     Bilateral ocular hypertension 09/15/2022    Colon polyp     Coronary artery disease     Diabetes mellitus, type 2 (HCC/RAF)     Hyperlipidemia     Hypertension      Past surgical history:         Past Surgical History:   Procedure Laterality Date    COLONOSCOPY N/A 02/21/2016    CORONARY STENT PLACEMENT  03/13/2013    SELECTIVE LASER TRABECULOPLASTY Bilateral     SKIN CANCER DESTRUCTION         Medications:         Medications that the patient states to be currently taking   Medication Sig    aspirin 81 mg EC tablet Take 1 tablet (81 mg total) by mouth daily.    ATORVASTATIN 80 mg tablet TAKE 1 TABLET(80 MG) BY MOUTH DAILY    Calcium Polycarbophil (FIBER) 625 MG TABS Take 625 mg by mouth daily as needed.    cyanocobalamin 1000 mcg tablet Take 1 tablet (1,000 mcg total) by mouth daily as needed.    ezetimibe 10 mg tablet Take 1 tablet (10 mg total) by mouth daily. hydroCHLOROthiazide 25 mg tablet Take 1 tablet (25 mg total) by mouth daily.    JARDIANCE 25 MG tablet TAKE 1 TABLET(25 MG) BY MOUTH EVERY MORNING    losartan 100 mg tablet Take 1 tablet (100 mg total) by mouth daily.    metFORMIN (GLUCOPHAGE XR) 500 mg PO ER 24 hr tablet Take 2 tablets (1,000 mg total) by mouth two (2) times daily.    Omega-3 Fatty Acids (FISH OIL) 1000 mg capsule Take 1 capsule (1 g total) by mouth daily as needed.    ONETOUCH DELICA FINE lancets USE 1 LANCET TWICE A DAY (Patient taking differently: 1 Lancet by Does not apply route two (2) times daily.)    ONETOUCH ULTRA BLUE test strip USE 1 STRIP TWICE A DAY (Patient taking differently: 1 strip by use with Diabetic testing device route two (2)  times daily.)    semaglutide 1 mg/dose (OZEMPIC) 4 mg/3 mL SC injection pen Inject 1 mg under the skin once a week.    vitamin E 100 unit capsule Take 1 capsule (45 mg total) by mouth daily as needed.       Allergy:       No Known Allergies    Family history:         Family History   Problem Relation Age of Onset    Alzheimer's disease Mother     Heart disease Father     No Known Problems Sister     Coronary artery disease Brother     No Known Problems Brother     Dementia Maternal Grandfather     No Known Problems Paternal Grandfather        Social history:         Social History     Tobacco Use    Smoking status: Some Days     Types: Cigars    Smokeless tobacco: Never    Tobacco comments:     occ. cigar   Substance Use Topics    Alcohol use: Yes     Alcohol/week: 1.8 oz     Types: 3 Standard drinks or equivalent per week                    Latest Reference Range & Units 05/28/23 10:32   Sodium 135 - 146 mmol/L 137   Potassium 3.6 - 5.3 mmol/L 4.2   Chloride 96 - 106 mmol/L 101   Total CO2 20 - 30 mmol/L 22   Anion Gap 8 - 19 mmol/L 14   Urea Nitrogen 7 - 22 mg/dL 17   Creatinine 1.61 - 1.30 mg/dL 0.96   Estimated GFR See GFR Additional Information mL/min/1.28m2 >89   GFR Additional Information  See Comment   Glucose 65 - 99 mg/dL 045 (H)   Hemoglobin W0J <5.7 % HbA1c 6.9 (H)   Calcium 8.6 - 10.4 mg/dL 9.4   Cholesterol See Comment mg/dL 811   Cholesterol, HDL >40 mg/dL 40 (L)   Cholesterol,LDL,Calc <100 mg/dL 48   Non-HDL,Chol,Calc <130 mg/dL 65   Triglycerides <914 mg/dL 87   AST (SGOT) 13 - 62 U/L 38   ALT (SGPT) 8 - 70 U/L 45   Alkaline Phosphatase 37 - 133 U/L 57   Bilirubin,Total 0.1 - 1.2 mg/dL 0.5   Albumin 3.9 - 5.0 g/dL 4.4   TOTAL PROTEIN 6.1 - 8.2 g/dL 7.3   (H): Data is abnormally high  (L): Data is abnormally low    PE:         BP 123/80  ~ Pulse 73  ~ Temp 36 ?C (96.8 ?F) (Tympanic)  ~ Resp 16  ~ Ht 5' 11'' (1.803 m)  ~ Wt 217 lb (98.4 kg)  ~ BMI 30.27 kg/m?           Constitutional:    Appearance:   Well nourished, well developed, in no acute distress  Psychiatric:   Mood normal, affect appropriate    Assessment/ Plan:    1. Type 2 diabetes mellitus with hyperglycemia, unspecified whether long term insulin use (HCC/RAF)  - reviewed the lab result in detail   - will keep Ozempic at 1 mg   - CBC & Auto Differential; Future  - Hgb A1c; Future  - TSH with reflex FT4, FT3; Future    2. Hyperlipidemia associated with type 2 diabetes mellitus (HCC/RAF)  -  on statin   - Lipid Panel; Future    3. Hypertension associated with diabetes (HCC/RAF)  - stable   - Comprehensive Metabolic Panel; Future  - **[facility] REFERRAL TO CARDIOLOGY    4. Prostate cancer screening  - PSA,Total; Future       The above recommendation were discussed with the patient.  The patient has all questions answered satisfactorily and is in agreement with this recommended plan of care.    Author:       Erling Cruz, MD  8:40 AM      Future Appointments         Provider Department Visit Type    07/09/2023 9:30 AM Sandre Kitty, DPM [facility]

## 2023-06-10 MED ORDER — OZEMPIC (1 MG/DOSE) 4 MG/3ML SC SOPN
0 refills
Start: 2023-06-10 — End: ?

## 2023-06-11 MED ORDER — OZEMPIC (1 MG/DOSE) 4 MG/3ML SC SOPN
0 refills | Status: AC
Start: 2023-06-11 — End: ?

## 2023-06-15 MED ORDER — LOSARTAN POTASSIUM 100 MG PO TABS
100 mg | ORAL_TABLET | Freq: Every day | ORAL | 0 refills | Status: AC
Start: 2023-06-15 — End: ?

## 2023-06-15 MED ORDER — METFORMIN HCL ER 500 MG PO TB24
1000 mg | ORAL_TABLET | Freq: Two times a day (BID) | ORAL | 0 refills | Status: AC
Start: 2023-06-15 — End: ?

## 2023-06-15 MED ORDER — EZETIMIBE 10 MG PO TABS
10 mg | ORAL_TABLET | Freq: Every day | ORAL | 0 refills | Status: AC
Start: 2023-06-15 — End: ?

## 2023-06-19 ENCOUNTER — Other Ambulatory Visit: Payer: Self-pay | Admitting: Family Medicine

## 2023-06-19 DIAGNOSIS — K219 Gastro-esophageal reflux disease without esophagitis: Secondary | ICD-10-CM

## 2023-06-21 NOTE — Telephone Encounter (Signed)
 Requested Prescriptions  Pending Prescriptions Disp Refills   omeprazole (PRILOSEC) 40 MG capsule [Pharmacy Med Name: OMEPRAZOLE 40MG  CAPSULES] 90 capsule 0    Sig: TAKE 1 CAPSULE(40 MG) BY MOUTH DAILY     Gastroenterology: Proton Pump Inhibitors Failed - 06/21/2023  2:55 PM      Failed - Valid encounter within last 12 months    Recent Outpatient Visits           1 year ago Pure hypercholesterolemia   [facility] Donita Brooks, MD   2 years ago Current moderate episode of major depressive disorder without prior episode (HCC)   [facility] Donita Brooks, MD   2 years ago Viral upper respiratory tract infection   [facility] Donita Brooks, MD   2 years ago Acute pain of right knee   [facility] Tanya Nones Priscille Heidelberg, MD   2 years ago Need for immunization against influenza   [facility] Pickard, Priscille Heidelberg, MD

## 2023-06-25 ENCOUNTER — Ambulatory Visit: Payer: PRIVATE HEALTH INSURANCE

## 2023-06-25 DIAGNOSIS — I10 Essential (primary) hypertension: Secondary | ICD-10-CM

## 2023-06-25 NOTE — Patient Instructions
 Please schedule echo, stress test and follow up    Please refer to Dr. Blake Divine

## 2023-06-25 NOTE — Progress Notes
 OUTPATIENT CARDIOVASCULAR CONSULTATION NOTE   PATIENT: Keith Cabrera  MRN: [id number]  DOB: [birth year]  DATE OF SERVICE: 06/25/2023    REFERRING PRACTITIONER: Erling Cruz., MD  PRIMARY CARE PROVIDER: Erling Cruz., MD  REASON FOR CONSULT: CAD    History of Present Illness:   Keith Cabrera is a 63 y.o. male who I am seeing for  evaluation of CAD. Pt is transferring care to [facility]. He has a history of CAD with PCI to LAD in 2010 at [facility]. He states since that time he has been stable. He also has dyslipidemia and hypertension. No other complaints at this time. No chest pain, he does not smoke cigarettes, occasionally smokes cigars.            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    Colon polyp     Coronary artery disease     Diabetes mellitus, type 2 (HCC/RAF)     Hyperlipidemia     Hypertension      Past Surgical History:     Past Surgical History:   Procedure Laterality Date    COLONOSCOPY N/A 02/21/2016    CORONARY STENT PLACEMENT  03/13/2013    SELECTIVE LASER TRABECULOPLASTY Bilateral     SKIN CANCER DESTRUCTION       Social History:     Social History     Socioeconomic History    Marital status: Single    Number of children: 0    Highest education level: Some college, no degree   Occupational History    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 cigar   Vaping Use    Vaping status: Never Used   Substance and Sexual Activity    Alcohol use: Yes     Alcohol/week: 1.8 oz     Types: 3 Standard drinks or equivalent per week    Drug use: Not Currently     Frequency: 7.0 times per week     Types: Marijuana     Comment: used for years, age 77 reduced to only rare use   Social History Narrative    Born and raised Designer, fashion/clothing     Family History:     Family History   Problem Relation Age of Onset    Alzheimer's disease Mother     Heart disease Father     No Known Problems Sister     Coronary artery disease Brother     No Known Problems Brother     Dementia Maternal Grandfather     No Known Problems Paternal Grandfather        Allergies:   No Known Allergies  Home Medications:     Current Outpatient Medications   Medication Sig    aspirin 81 mg EC tablet Take 1 tablet (81 mg total) by mouth daily.    ATORVASTATIN 80 mg tablet TAKE 1 TABLET(80 MG) BY MOUTH DAILY    Calcium Polycarbophil (FIBER) 625 MG TABS Take 625 mg by mouth daily as needed.    cyanocobalamin 1000 mcg tablet Take 1 tablet (1,000 mcg total) by mouth daily as needed.    ezetimibe 10 mg tablet Take  1 tablet (10 mg total) by mouth daily.    hydroCHLOROthiazide 25 mg tablet Take 1 tablet (25 mg total) by mouth daily.    JARDIANCE 25 MG tablet TAKE 1 TABLET(25 MG) BY MOUTH EVERY MORNING    losartan 100 mg tablet Take 1 tablet (100 mg total) by mouth daily.    metFORMIN (GLUCOPHAGE XR) 500 mg PO ER 24 hr tablet Take 2 tablets (1,000 mg total) by mouth two (2) times daily.    nystatin-triamcinolone 100000-0.1 unit/g-% cream APPLY A THIN FILM TO THE AFFECTED AREA BETWEEN BUTTOCKS TWICE DAILY UNTIL FOLLOW UP    Omega-3 Fatty Acids (FISH OIL) 1000 mg capsule Take 1 capsule (1 g total) by mouth daily as needed.    ONETOUCH DELICA FINE lancets USE 1 LANCET TWICE A DAY (Patient taking differently: 1 Lancet by Does not apply route two (2) times daily.)    ONETOUCH ULTRA BLUE test strip USE 1 STRIP TWICE A DAY (Patient taking differently: 1 strip by use with Diabetic testing device route two (2) times daily.)    OZEMPIC 4 MG/3ML injection pen INJECT 1 MG UNDER THE SKIN ONCE A WEEK    Sodium Sulfate-Mag Sulfate-KCl (SUTAB) [phone] MG TABS Take 12 tablets with 16 oz of water over the course of 15-20 min. Then drink x2 16 oz of water 1 hour later. The day of the colonoscopy repeat with the 2nd bottle 5-8 hrs before your procedure. vitamin E 100 unit capsule Take 1 capsule (45 mg total) by mouth daily as needed.     No current facility-administered medications for this visit.           Review of Systems:    14 points  comprehensive review of system  was performed and is negative except those mentioned in HPI.    Physical Exam:   VITALS: BP 117/72  ~ Pulse 73  ~ Temp (!) 35.8 ?C (96.5 ?F) (Tympanic)  ~ Resp 16  ~ SpO2 98%    There is no height or weight on file to calculate BMI.  General: alert, appears stated age, and cooperative  Skin: normal and no rashes  Eyes: conjunctivae/corneas clear. PERRL, EOM's intact.   HEET: atrauamtic with moist mucous membranes  Neck: Normal, JVP is not elevated  Respiratory: respiratory effort is normal, clear to auscultation bilaterally  Heart: regular rate and rhythm, S1, S2 normal, no murmur, click, rub or gallop  Abdomen: soft, non-tender; bowel sounds normal; no masses,  no organomegaly  Extremities: extremities normal, atraumatic, no cyanosis or edema  Pulses: 2+ and symmetric  Neurological/Psychiatric: oriented x 3, normal mood and affect, normal judgment and insight    Laboratory Data:       Lab Results   Component Value Date    NA 137 05/28/2023    K 4.2 05/28/2023    CL 101 05/28/2023    CO2 22 05/28/2023    BUN 17 05/28/2023    CREAT 0.85 05/28/2023    CALCIUM 9.4 05/28/2023     Lab Results   Component Value Date    CHOL 105 05/28/2023    CHOLHDL 40 (L) 05/28/2023    CHOLDLCAL 48 05/28/2023    TRIGLY 87 05/28/2023     @(LASTA1C)@ Lab Results   Component Value Date    WBC 8.15 02/09/2022    HGB 15.8 02/09/2022    HCT 47.6 02/09/2022    MCV 95.4 02/09/2022    PLT 285 02/09/2022     Lab Results   Component  Value Date    AST 38 05/28/2023    ALT 45 05/28/2023    ALKPHOS 57 05/28/2023    BILITOT 0.5 05/28/2023    ALBUMIN 4.4 05/28/2023     Lab Results   Component Value Date    TSH 2.0 12/06/2021    HGBA1C 6.9 (H) 05/28/2023        Lab Results   Component Value Date    WBC 8.15 02/09/2022    HGB 15.8 02/09/2022    HCT 47.6 02/09/2022    MCV 95.4 02/09/2022    PLT 285 02/09/2022     Lab Results   Component Value Date    NA 137 05/28/2023    K 4.2 05/28/2023    CL 101 05/28/2023    CO2 22 05/28/2023    BUN 17 05/28/2023    CREAT 0.85 05/28/2023    CALCIUM 9.4 05/28/2023     Lab Results   Component Value Date    AST 38 05/28/2023    ALT 45 05/28/2023    ALKPHOS 57 05/28/2023    BILITOT 0.5 05/28/2023    ALBUMIN 4.4 05/28/2023     Lab Results   Component Value Date    CHOL 105 05/28/2023    CHOLHDL 40 (L) 05/28/2023    CHOLDLCAL 48 05/28/2023    TRIGLY 87 05/28/2023     No results found for: ''PT'', ''INR''  Lab Results   Component Value Date    HGBA1C 6.9 (H) 05/28/2023     Lab Results   Component Value Date    TSH 2.0 12/06/2021     No results found for: ''BNP''   No results found for: ''HSCRP''    No components found for: ''HGA1C''  @(LASTA1C)@  No components found for: ''HA1C''    -1.0% is the estimated 10-year risk of atherosclerotic cardiovascular disease (ASCVD) as of 10:34 AM on 06/25/2023  Values used to calculate ASCVD score:  Age: 63 y.o.    Gender: Male Race: Not African American.  40 mg/dL. (measured on 05/28/2023)  105 mg/dL. Cannot calculate risk because Total cholesterol <130 mg/dL. (measured on 05/28/2023)  117 mm Hg. (measured on 06/25/2023)  Yes  currently a smoker  Yes    Diagnostic/Imaging Studies:     06/25/23  ECG independently reviewed by me:    NSR rate 70        Assessment :   Keith Cabrera is a 63 y.o. male with:    CAD s/p PCI to LAD in 2010 at [facility] hospital  Dyslipidemia  Hypertension   Diabetes       Plan:     EKG is consistent NSR rate 70  Echo and stress test ordered for further risk stratification  All labs reviewed  Continue aspirin/statin as directed  Continue losartan 100 mg daily  DM management per pmd  Recommend low fat/low salt diet and exercise as directed  Pt referred to sleep medicine for further evaluation  Follow up same day as cardiac testing or sooner with any questions or concerns                I spent 61 mins with pt going over history, symptoms, medications and treatment plans     Topics of my discussion are in my note.          Current plan of care was discussed in detail with the patient and all questions were answered.     Carney Living MD    Department of  Cardiology  [facility]

## 2023-07-05 ENCOUNTER — Other Ambulatory Visit: Payer: BC Managed Care – HMO

## 2023-07-05 ENCOUNTER — Telehealth: Payer: BC Managed Care – HMO

## 2023-07-05 NOTE — Telephone Encounter
 Confirmation Documentation   Patient is scheduled on (DATE) for: 04/07 @ 8:30am   [x]  Colonoscopy   []  Upper Endoscopy   []  Pouchoscopy   []  Upper Endoscopy w/Bravo   []  Upper Endoscopy w/ Esophageal Manometry   []  Upper Endoscopy w/ Esophageal Manometry & pH   []  Upper Endoscopy w/Endoflip   []  Sigmoidoscopy   []  Anoscopy   []  Illeoscopy   []  Small Bowel Enteroscopy   []  Esophageal Manometry   []  Anorectal Manometry   []  pH Study   []  Capsule Endoscopy    Informed patient of the following:   []  Arrival time   []  Location including suite number   []  Transportation   []  Mac Script   []  Does patient have prep instructions?   []  Informed patient to call back should there be any questions?   [x]  Does procedure order match the case scheduled    NOTE: If patients have any questions regarding prescribed medication patient needs to contact their prescribing physician to ensure it is ok to stop medications.

## 2023-07-06 NOTE — Telephone Encounter
 Informed patient of the following:   Gilian[employer]Kraft ]  Arrival time   Gilian[employer]Kraft ]  Location including suite number   [Y]  Transportation   Gilian.Kraft ]  Mac Script   Gilian.Kraft ]  Does patient have prep instructions?   Gilian.Kraft ]  Informed patient to call back should there be any questions?   Gilian.Kraft ]  Does procedure order match the case scheduled

## 2023-07-08 ENCOUNTER — Other Ambulatory Visit: Payer: BC Managed Care – HMO

## 2023-07-08 ENCOUNTER — Telehealth: Payer: BC Managed Care – HMO

## 2023-07-08 NOTE — Telephone Encounter
 LEFT VM  Hello Keith Cabrera,    Below for information regarding your upcoming procedure.      Location: [facility] - [address] [place] [address]  Date:    Check-in time: 7:30 AM  Procedure start time: 8:30 AM    We ask that you bring a responsible adult to accompany you home. We will need to be able to verify your responsible adult upon check-in, either via a phone call or in-person. Inability to confirm may result in delay or cancellation of your procedure.  Please be sure to review your current medications and make any necessary adjustments to your medication regimen, pay close attention to any blood thinners, diabetic and weight loss medications. Contact your physician for instructions of when to stop these medications. Not stopping appropriately may result in a cancellation or delay of your procedure.  As a reminder, please leave all valuables at home including any jewelry.   Below is a quick reference for your bowel prep timeline. Please review the [place] preparation instructions provided to you in its entirety, we've attached a copy for you.  1st Dose Prep Start Time: 3:00 - 6:00 PM the evening before your procedure  2nd Dose Prep Start Time: 12:30 AM - 2:30 AM  (continue to hydrate!)  As of 4:30 AM please stop all liquids, medications, and do not take anything by mouth including gum, mint, and candy as it may result in delay or cancellation of your procedure.      Please call us at [phone] should you have any questions or concerns.    Thank you,    [facility]

## 2023-07-09 ENCOUNTER — Ambulatory Visit: Payer: BC Managed Care – HMO

## 2023-07-09 DIAGNOSIS — E1142 Type 2 diabetes mellitus with diabetic polyneuropathy: Secondary | ICD-10-CM

## 2023-07-09 DIAGNOSIS — B351 Tinea unguium: Secondary | ICD-10-CM

## 2023-07-09 DIAGNOSIS — L603 Nail dystrophy: Secondary | ICD-10-CM

## 2023-07-09 DIAGNOSIS — Q667 Congenital pes cavus, unspecified foot: Secondary | ICD-10-CM

## 2023-07-09 NOTE — Progress Notes
 Chief Complaint   Pt is here for diabetic foot exam.    HPI:   Keith Cabrera is a 63 y.o. male who presents for diabetic foot exam.  PCP: Charolette Copier., MD    Pt has been diabetic for > 10 year(s)  Pt admits to tingling, burning and numbness to the feet.  he denies previous foot complications associated with diabetes.  Diabetes is well controlled  No recent trauma or inciting events.    Patient does admit several months ago driving and developing bruising to right hallux nail. Nail does not hurt and still attached.  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    ATORVASTATIN 80 mg tablet TAKE 1 TABLET(80 MG) BY MOUTH DAILY    Calcium Polycarbophil (FIBER) 625 MG TABS Take 625 mg by mouth daily as needed.    cyanocobalamin 1000 mcg tablet Take 1 tablet (1,000 mcg total) by mouth daily as needed.    ezetimibe 10 mg tablet Take 1 tablet (10 mg total) by mouth daily.    hydroCHLOROthiazide 25 mg tablet Take 1 tablet (25 mg total) by mouth daily.    JARDIANCE 25 MG tablet TAKE 1 TABLET(25 MG) BY MOUTH EVERY MORNING    losartan 100 mg tablet Take 1 tablet (100 mg total) by mouth daily.    metFORMIN (GLUCOPHAGE XR) 500 mg PO ER 24 hr tablet Take 2 tablets (1,000 mg total) by mouth two (2) times daily.    nystatin-triamcinolone 100000-0.1 unit/g-% cream APPLY A THIN FILM TO THE AFFECTED AREA BETWEEN BUTTOCKS TWICE DAILY UNTIL FOLLOW UP    Omega-3 Fatty Acids (FISH OIL) 1000 mg capsule Take 1 capsule (1 g total) by mouth daily as needed.    ONETOUCH DELICA FINE lancets USE 1 LANCET TWICE A DAY (Patient taking differently: 1 Lancet by Does not apply route two (2) times daily.)    ONETOUCH ULTRA BLUE test strip USE 1 STRIP TWICE A DAY (Patient taking differently: 1 strip by use with Diabetic testing device route two (2) times daily.)    OZEMPIC 4 MG/3ML injection pen INJECT 1 MG UNDER THE SKIN ONCE A WEEK    Sodium Sulfate-Mag Sulfate-KCl (SUTAB) [phone] MG TABS Take 12 tablets with 16 oz of water over the course of 15-20 min. Then drink x2 16 oz of water 1 hour later. The day of the colonoscopy repeat with the 2nd bottle 5-8 hrs before your procedure.    vitamin E 100 unit capsule Take 1 capsule (45 mg total) by mouth daily as needed.     No facility-administered medications prior to visit.       No Known Allergies    Past Surgical History:   Procedure Laterality Date    COLONOSCOPY N/A 02/21/2016    CORONARY STENT PLACEMENT  03/13/2013    SELECTIVE LASER TRABECULOPLASTY Bilateral     SKIN CANCER DESTRUCTION           PHYSICAL EXAM:   GEN: no acute distress alert/oriented x3 appropriate mood and affect looks stated age well nourished  VASC:  Dorsalis Pedis:  present  Posterior Tibial:  present  There is brisk capillary refill time to all digits less than 3 seconds.   varicosities are absent Bilateral, edema is absent Bilateral , ecchymosis is observed in the right hallux nail no tendernesstopalpation no erythema  DERM:   Tone and turgor are adequate.  Erythema is none.    There are no pre-ulcerative lesions  Calluses are absent    discoloration: yes thickening: yes b/l hallux  They are not tender with direct palpation.  NEURO:  protective sensation is intact to the digits.  Vibratory sensation is intact.  There peripheral neuropathy is noted.  MSK:  Muscle strength 5/5 in all 4 quadrants.    Ankle ROM is equal bilaterally, Subtalar joint ROM is equal bilaterally  Pes cavus  Hgb A1c   Date/Time Value Ref Range Status   05/28/2023 10:32 AM 6.9 (H) <5.7 % HbA1c Final     Comment:     For patients with diabetes, an A1c less than (<) or equal (=) to 7.0% is recommended for most patients, however the goal may be higher or lower depending on age and/or other medical problems.     For a diagnosis of diabetes, A1c greater than (>) or equal(=) to 6.5% indicates diabetes; values between 5.7% and 6.4% may indicate an increased risk of developing diabetes.    Hemoglobin A1c is determined with Roche Cobas immunoassay. The presence of abnormal hemoglobin variants, Thalassemia, and increased concentration of HbF (>7%), may result in lower HbA1c values.     White Blood Cell Count   Date/Time Value Ref Range Status   02/09/2022 08:03 AM 8.15 4.16 - 9.95 x10E3/uL Final     Platelet Count, Auto   Date/Time Value Ref Range Status   02/09/2022 08:03 AM 285 143 - 398 x10E3/uL Final          Assessment Keith Cabrera   Keith Cabrera is a 63 y.o. male who presents with:  type II with neuropathy  Diabetic foot education discussed.  Pt is considered medium risk for diabetic foot complications  Discussed warning signs of peripheral neuropathy including tingling, burning and pain in the feet.  Discussed adequate shoes and inserts  Pt will inspect her feet daily and contact us  if there are any abnormalities  Discussed orthotic therapy patient will consider      Willa Haring, [facility]  07/09/2023

## 2023-07-12 MED ADMIN — PROPOFOL 200 MG/20ML IV EMUL: INTRAVENOUS | @ 16:00:00 | Stop: 2023-07-12 | NDC 63323026929

## 2023-07-12 MED ADMIN — LIDOCAINE HCL (CARDIAC) 100 MG/5ML IV SOSY: INTRAVENOUS | @ 16:00:00 | Stop: 2023-07-12 | NDC 76329339001

## 2023-07-12 NOTE — H&P
 PRE-PROCEDURE BRIEF H&P FOR SEDATION AND ANALGESIA    Keith Cabrera is a 63 y.o. male with PMHx DM type 2, HTN, HL, CAD who presents for a screening colonoscopy.     Procedure: []  EGD  [x]  Colonoscopy  []  PEG  []  Enteroscopy  []  Flexible Sigmoidoscopy                     []  Other:    Level of sedation intended for procedure:    []  Moderate []  Deep [x]  MAC []  Anesthesia   Informed Consent Obtained Including Risks, Benefits & Alternatives:  [x]  Yes    Informed Consent Obtained For Sedation Including Risks, Benefits & Alternatives: [x]  Yes    Prior sedation problems []  Yes [x]  No  If yes explain  Teeth: [x]  Intact []  Loose []  Missing []  Dentures []  Bridges  [place] Visualized: [x]  Yes []  Partially []  not at all    Neck ROM: [x]   Normal []  Limited      Physical Exam:    Vitals Signs: BP 131/74  ~ Pulse 64  ~ Temp 36.4 ?C (97.6 ?F) (Forehead)  ~ Resp 17  ~ Ht 5' 11'' (1.803 m)  ~ Wt 215 lb (97.5 kg)  ~ SpO2 97%  ~ BMI 29.99 kg/m?     Normal Exam                                     Additional Findings  [x]  General  [x]  HEENT  [x]  Heart / CVS  [x]  Lungs / Respiratory  [x]  Abdomen      ASA Classification: ASA 2 - Patient with mild systemic disease with no functional limitations    I have reviewed the history and physical and have determined Keith Cabrera to be an appropriate candidate to undergo the planned procedure with sedation and analgesia.    Amdrew Oboyle A. Andrews Tener 07/12/2023 8:26 AM

## 2023-07-12 NOTE — Procedures
 PATIENT NAME: Keith Cabrera, Keith Cabrera  DATE OF BIRTH: Apr 25, 1960  RECORD NUMBER: [id number]  DATE/TIME OF PROCEDURE: 07/12/2023 / 08:30 AM  ENDOSCOPIST: Alvina Filbert, MD  REFERRING PHYSICIAN:  FELLOW:    INDICATIONS FOR EXAMINATION:Screening Colonoscopy.  PROCEDURE PERFORMED: COLONOSCOPY - screening, average risk, cold biopsy    MEDICATIONS: MAC Anesthesia    PROCEDURE TECHNIQUE:  Patient's medications, allergies, past medical, surgical, social and family histories were reviewed and updated as appropriate. A discussion of informed consent was had with the patient and/or the patient's family prior to the procedure, including   sedation. The alternatives, benefits and risks of the procedure including but not limited to perforation, hemorrhage, infection, adverse drug reaction and aspiration were discussed    Description of Procedure:  After discussion of informed consent, and appropriate level of sedation were attained, the patient was placed in the left lateral position. The patient was monitored continuously with pulse oximetry, blood pressure monitoring, and direct observations.    After digital rectal examination, the colonoscope CF [id number]: [id number] was inserted into the rectum and advanced under direct vision to the level of the terminal ileum. The quality of the colonic preparation was Good. A careful inspection was made as the   colonoscope was withdrawn. Findings and interventions are described below.?  TOTAL WITHDRAWAL TIME: 00:11?  TOTAL INSERTION TIME: 00:14    EXTENT OF EXAM: terminal ileum      INSTRUMENTS: CF [id number]: [id number]  TECHNICAL DIFFICULTY: No  LIMITATIONS: TOLERANCE: Good  VISUALIZATION: Good    FINDINGS:  The terminal ileum appeared normal.  1 mm sessile polyp in the mid ascending colon. Polypectomy performed with cold biopsy forcep with complete polyp removal.  3 mm sessile polyp in the mid descending colon. Polypectomy performed with cold biopsy forcep with complete polyp removal.  Retroflexion was performed in the rectum which was normal.    ESTIMATED BLOOD LOSS: None    DIAGNOSIS:  Normal terminal ileum.  Successful removal of x2 polyps, biopsied.    RECOMMENDATIONS:  Repeat colonoscopy in 5 years, depending on pathology results.          This electronic signature authenticates all electronic and/or handwritten documentation, including orders, generated by the signer during the episode of care contained in this record.07/12/2023 08:54 AM by Alvina Filbert, MD

## 2023-07-12 NOTE — H&P
UDATED H& REQUIREMENT    For [place] [facility] and [facility]    WHAT IS THE STATUS OF THE ATIENT'S MOST CURRENT HISTORY AND HYSICAL?   - The most current H& was performed within the past 24 hours. No additional updated H& documentation is necessary.     REFER TO MEDICAL STAFF OLICIES REGARDING RE-ROCEDURE HISTORY AND HYSICAL EXAMINATION AND UDATED H& REQUIREMENTS BELOW:    Jesup Rains [facility] and [facility] Medical Staff olicy [address] - For atients Undergoing rocedures Requiring Moderate or Deep Sedation, General Anesthesia or Regional Anesthesia    Contents of a History and hysical Examination (H&):    The H& shall consist of chief complaint, history of present illness, allergies and medications, relevant social and family history, past medical history, review of systems and physical examination, and assessment and plan appropriate to the patient's age.    For atients Undergoing rocedures Requiring Moderate or Deep Sedation, General Anesthesia or Regional Anesthesia:    1. An H& shall be performed within 24 hours prior to the procedure by a qualified member of the medical staff or designee with appropriate privileges, except as noted in item 2 below.    2. If a complete history and physical was performed within thirty (30) calendar days prior to the patient's admission to the Medical Center for elective surgery, a member of the medical staff assumes the responsibility for the accuracy of the clinical information and will need to document in the medical record within twenty-four (24) hours of admission and prior to surgery or major invasive procedure, that they either attest that the history and physical has been reviewed and accepted, or document an update of the original history and physical relevant to the patient's current clinical status.    3. roviding an H& for  patients undergoing surgery under local anesthesia is at the discretion of the Attending hysician.     4. When a procedure is performed by a dentist, podiatrist or other practitioner who is not privileged to perform an H&, the anesthesiologist's assessment immediately prior to the procedure will constitute the 24 hour re-assessment.The dentist, podiatrist or other practitioner who is not privileged to perform an H& will document the history and physical relevant to the procedure.    5. If the H& and the written informed consent for the surgery or procedure are not recorded in the patient's medical record prior to surgery, the operation shall not be performed unless the attending physician states in writing that such a delay could lead to an adverse event or irreversible damage to the patient.    6. The above requirements shall not preclude the rendering of emergency medical or surgical care to a patient in dire circumstances.

## 2023-07-12 NOTE — OR Nursing
 Patient is doing well post procedure, alert and oriented. Tolerating p.o. intake well. No nausea or vomiting. Abdomen soft and non distended, no complaints. Dr. Donnamarie Poag spoke with patient regarding procedure results and follow up care. Verbalized understanding of discharge instructions as given by registered nurse. All patient inquiries answered. Discharged on a stable condition with no complaints, ambulatory, gait steady accompanied by a responsible adult.

## 2023-07-12 NOTE — Discharge Instructions
 PROCEDURE:   []  Upper Endoscopy (EGD)        [x]  Colonoscopy        []  Sigmoidoscopy    FINDINGS:  See procedure report.    ACTIVITY:  [x]  Avoid any strenuous physical activity for 24 hours  [x]  No driving or operating heavy machinery for 24 hours  [x]  No alcoholic beverages for 24 hours (may interact with some medications you received during your procedure)  [x]  May use a heating pad on low for any abdominal cramping.     DIET:  [x]  Resume a soft diet for today.  [x]  Specific diet instructions:     Call Dr. Ivor Reining office if you experience any of the following:  [x]  Severe chest pain, difficulty breathing  [x]  Passage of blood clots, black tarry stools, vomiting blood  [x]  Severe inflammation at the I.V. site  [x]  Difficulty swallowing or persistent vomiting  [x]  Abdominal pain  [x]  Chills or fever greater than 101 F or 38.4 C within 24 hours of procedure    FOLLOW-UP CARE:    [x]  Repeat colonoscopy in 5 years. Subject to change based on biopsy results.  [x]  Resume home medications     You will be notified of your pathology results by email.  If you are signed up for [facility], your pathology results will be released to you via myChart.  Please make sure that you have enabled the ability to view hospital results.  If you do not receive your results within 14 days, please call the GI office at [phone] and leave a message for Dr. Donnamarie Poag.

## 2023-07-14 LAB — Tissue Exam

## 2023-08-06 ENCOUNTER — Other Ambulatory Visit: Payer: BC Managed Care – HMO

## 2023-08-10 NOTE — Telephone Encounter (Signed)
 Cpap order faxed to [facility]

## 2023-08-12 ENCOUNTER — Encounter (HOSPITAL_COMMUNITY): Payer: Self-pay

## 2023-08-13 ENCOUNTER — Inpatient Hospital Stay: Payer: BC Managed Care – HMO

## 2023-08-13 ENCOUNTER — Ambulatory Visit: Payer: BC Managed Care – HMO

## 2023-08-13 DIAGNOSIS — I10 Essential (primary) hypertension: Secondary | ICD-10-CM

## 2023-08-13 DIAGNOSIS — I2510 Atherosclerotic heart disease of native coronary artery without angina pectoris: Secondary | ICD-10-CM

## 2023-08-13 NOTE — Patient Instructions
Follow up on 1/25 at 11 am

## 2023-08-13 NOTE — Progress Notes
 OUTPATIENT CARDIOVASCULAR CONSULTATION NOTE   PATIENT: Keith Cabrera  MRN: [id number]  DOB: 1960/08/08  DATE OF SERVICE: 08/13/2023    REFERRING PRACTITIONER: Laqueta Plane, MD  PRIMARY CARE PROVIDER: Charolette Copier., MD  REASON FOR CONSULT: CAD    History of Present Illness:   Keith Cabrera is a 63 y.o. male who I am seeing for  evaluation of CAD. Pt is transferring care to [facility]. He has a history of CAD with PCI to LAD in 2010 at [facility] He states since that time he has been stable. He also has dyslipidemia and hypertension. No other complaints at this time. No chest pain, he does not smoke cigarettes, occasionally smokes cigars.     Interval history 08/12/23  Pt is here for follow up after echo and stress testing. Did well. No complaints. No chest pain or sob.        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 Repeat in 5 yrs (2030). Performed by Dr. 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 cigar   Vaping Use    Vaping status: Never Used   Substance and Sexual Activity    Alcohol use: Yes     Alcohol/week: 1.8 oz     Types: 3 Standard drinks or equivalent per week    Drug use: Not Currently     Frequency: 7.0 times per week     Types: Marijuana     Comment: used for years, age 2 reduced to only rare use   Social History Narrative    Born and raised Designer, fashion/clothing     Family History:     Family History   Problem Relation Age of Onset    Alzheimer's disease Mother     Heart disease Father     No Known Problems Sister     Coronary artery disease Brother     No Known Problems Brother     Dementia Maternal Grandfather     No Known Problems Paternal Grandfather        Allergies:   No Known Allergies  Home Medications:     Current Outpatient Medications   Medication Sig    aspirin 81 mg EC tablet Take 1 tablet (81 mg total) by mouth daily.    ATORVASTATIN 80 mg tablet TAKE  1 TABLET(80 MG) BY MOUTH DAILY    Calcium Polycarbophil (FIBER) 625 MG TABS Take 625 mg by mouth daily as needed.    cyanocobalamin 1000 mcg tablet Take 1 tablet (1,000 mcg total) by mouth daily as needed.    ezetimibe 10 mg tablet Take 1 tablet (10 mg total) by mouth daily.    hydroCHLOROthiazide 25 mg tablet Take 1 tablet (25 mg total) by mouth daily.    JARDIANCE 25 MG tablet TAKE 1 TABLET(25 MG) BY MOUTH EVERY MORNING    losartan 100 mg tablet Take 1 tablet (100 mg total) by mouth daily.    metFORMIN (GLUCOPHAGE XR) 500 mg PO ER 24 hr tablet Take 2 tablets (1,000 mg total) by mouth two (2) times daily.    nystatin-triamcinolone 100000-0.1 unit/g-% cream APPLY A THIN FILM TO THE AFFECTED AREA BETWEEN BUTTOCKS TWICE DAILY UNTIL FOLLOW UP    Omega-3 Fatty Acids (FISH OIL) 1000 mg capsule Take 1 capsule (1 g total) by mouth daily as needed.    ONETOUCH DELICA FINE lancets USE 1 LANCET TWICE A DAY (Patient taking differently: 1 Lancet by Does not apply route two (2) times daily.)    ONETOUCH ULTRA BLUE test strip USE 1 STRIP TWICE A DAY (Patient taking differently: 1 strip by use with Diabetic testing device route two (2) times daily.)    OZEMPIC 4 MG/3ML injection pen INJECT 1 MG UNDER THE SKIN ONCE A WEEK    vitamin E 100 unit capsule Take 1 capsule (45 mg total) by mouth daily as needed.     No current facility-administered medications for this visit.           Review of Systems:    14 points  comprehensive review of system  was performed and is negative except those mentioned in HPI.    Physical Exam:   VITALS: BP 120/71  ~ Pulse 75  ~ Temp 36.4 ?C (97.5 ?F)  ~ Ht 5' 11'' (1.803 m)  ~ Wt 215 lb (97.5 kg)  ~ SpO2 97%  ~ BMI 29.99 kg/m?    Body mass index is 29.99 kg/m?Aaron Aas  General: alert, appears stated age, and cooperative  Skin: normal and no rashes  Eyes: conjunctivae/corneas clear. PERRL, EOM's intact.   HEET: atrauamtic with moist mucous membranes  Neck: Normal, JVP is not elevated  Respiratory: respiratory effort is normal, clear to auscultation bilaterally  Heart: regular rate and rhythm, S1, S2 normal, no murmur, click, rub or gallop  Abdomen: soft, non-tender; bowel sounds normal; no masses,  no organomegaly  Extremities: extremities normal, atraumatic, no cyanosis or edema  Pulses: 2+ and symmetric  Neurological/Psychiatric: oriented x 3, normal mood and affect, normal judgment and insight    Laboratory Data:       Lab Results   Component Value Date    NA 137 05/28/2023    K 4.2 05/28/2023    CL 101 05/28/2023    CO2 22 05/28/2023    BUN 17 05/28/2023    CREAT 0.85 05/28/2023    CALCIUM 9.4 05/28/2023     Lab Results   Component Value Date    CHOL 105 05/28/2023    CHOLHDL 40 (L) 05/28/2023    CHOLDLCAL 48 05/28/2023    TRIGLY 87 05/28/2023     @(LASTA1C)@ Lab Results   Component Value Date    WBC 8.15 02/09/2022    HGB 15.8 02/09/2022    HCT 47.6 02/09/2022    MCV 95.4 02/09/2022    PLT  285 02/09/2022     Lab Results   Component Value Date    AST 38 05/28/2023    ALT 45 05/28/2023    ALKPHOS 57 05/28/2023    BILITOT 0.5 05/28/2023    ALBUMIN 4.4 05/28/2023     Lab Results   Component Value Date    TSH 2.0 12/06/2021    HGBA1C 6.9 (H) 05/28/2023        Lab Results   Component Value Date    WBC 8.15 02/09/2022    HGB 15.8 02/09/2022    HCT 47.6 02/09/2022    MCV 95.4 02/09/2022    PLT 285 02/09/2022     Lab Results   Component Value Date    NA 137 05/28/2023    K 4.2 05/28/2023    CL 101 05/28/2023    CO2 22 05/28/2023    BUN 17 05/28/2023    CREAT 0.85 05/28/2023    CALCIUM 9.4 05/28/2023     Lab Results   Component Value Date    AST 38 05/28/2023    ALT 45 05/28/2023    ALKPHOS 57 05/28/2023    BILITOT 0.5 05/28/2023    ALBUMIN 4.4 05/28/2023     Lab Results   Component Value Date    CHOL 105 05/28/2023    CHOLHDL 40 (L) 05/28/2023    CHOLDLCAL 48 05/28/2023    TRIGLY 87 05/28/2023     No results found for: ''PT'', ''INR''  Lab Results   Component Value Date    HGBA1C 6.9 (H) 05/28/2023     Lab Results   Component Value Date    TSH 2.0 12/06/2021     No results found for: ''BNP''   No results found for: ''HSCRP''    No components found for: ''HGA1C''  @(LASTA1C)@  No components found for: ''HA1C''    -1.0% is the estimated 10-year risk of atherosclerotic cardiovascular disease (ASCVD) as of 10:40 AM on 08/13/2023  Values used to calculate ASCVD score:  Age: 63 y.o.    Gender: Male Race: Not African American.  40 mg/dL. (measured on 05/28/2023)  105 mg/dL. Cannot calculate risk because Total cholesterol <130 mg/dL. (measured on 05/28/2023)  120 mm Hg. (measured on 08/13/2023)  Yes  currently a smoker  Yes    Diagnostic/Imaging Studies:     06/25/23  ECG independently reviewed by me:    NSR rate 70        Assessment :   Keith Cabrera is a 63 y.o. male with:    CAD s/p PCI to LAD in 2010 at [facility] hospital  Dyslipidemia  Hypertension   Diabetes       Plan:     Previous EKG is consistent NSR rate 70  Echo and stress test ordered for further risk stratification-prelim today stable  All labs reviewed  Continue aspirin/statin as directed  Continue losartan 100 mg daily  DM management per pmd  Recommend low fat/low salt diet and exercise as directed  Pt referred to sleep medicine for further evaluation  Follow up 6-7 months or sooner with any questions or concerns                I spent 46  mins with pt going over history, symptoms, medications and treatment plans     Topics of my discussion are in my note.          Current plan of care was discussed in detail with the patient and all questions were answered.  Laqueta Plane MD    Department of Cardiology  [facility]

## 2023-08-18 ENCOUNTER — Ambulatory Visit: Payer: BC Managed Care – HMO

## 2023-08-18 MED ORDER — HYDROCHLOROTHIAZIDE 25 MG PO TABS
25 mg | ORAL_TABLET | Freq: Every day | ORAL | 3 refills
Start: 2023-08-18 — End: ?

## 2023-08-19 ENCOUNTER — Ambulatory Visit: Payer: No Typology Code available for payment source

## 2023-08-19 MED ORDER — HYDROCHLOROTHIAZIDE 25 MG PO TABS
25 mg | ORAL_TABLET | Freq: Every day | ORAL | 3 refills | 90.00000 days | Status: AC
Start: 2023-08-19 — End: ?

## 2023-09-03 ENCOUNTER — Ambulatory Visit: Payer: Commercial Managed Care - Pharmacy Benefit Manager

## 2023-09-03 ENCOUNTER — Other Ambulatory Visit: Payer: BC Managed Care – HMO

## 2023-09-03 DIAGNOSIS — R0683 Snoring: Secondary | ICD-10-CM

## 2023-09-03 DIAGNOSIS — G4709 Other insomnia: Secondary | ICD-10-CM

## 2023-09-03 DIAGNOSIS — R0689 Other abnormalities of breathing: Secondary | ICD-10-CM

## 2023-09-03 DIAGNOSIS — F419 Anxiety disorder, unspecified: Secondary | ICD-10-CM

## 2023-09-03 DIAGNOSIS — Z0000 Encounter for general adult medical examination without abnormal findings: Secondary | ICD-10-CM

## 2023-09-03 DIAGNOSIS — E119 Type 2 diabetes mellitus without complications: Secondary | ICD-10-CM

## 2023-09-03 MED ORDER — METFORMIN HCL ER 500 MG PO TB24
1000 mg | ORAL_TABLET | Freq: Two times a day (BID) | ORAL | 3 refills | 90.00 days | Status: AC
Start: 2023-09-03 — End: ?

## 2023-09-03 MED ORDER — OZEMPIC (1 MG/DOSE) 4 MG/3ML SC SOPN
1 mg | SUBCUTANEOUS | 3 refills | 28.00 days | Status: AC
Start: 2023-09-03 — End: ?

## 2023-09-03 MED ORDER — HYDROCHLOROTHIAZIDE 25 MG PO TABS
25 mg | ORAL_TABLET | Freq: Every day | ORAL | 3 refills | 90.00 days | Status: AC
Start: 2023-09-03 — End: ?

## 2023-09-03 MED ORDER — ATORVASTATIN CALCIUM 80 MG PO TABS
80 mg | ORAL_TABLET | Freq: Every day | ORAL | 3 refills | 90.00 days | Status: AC
Start: 2023-09-03 — End: ?

## 2023-09-03 MED ORDER — EZETIMIBE 10 MG PO TABS
10 mg | ORAL_TABLET | Freq: Every day | ORAL | 3 refills | 30.00 days | Status: AC
Start: 2023-09-03 — End: ?

## 2023-09-03 MED ORDER — LOSARTAN POTASSIUM 100 MG PO TABS
100 mg | ORAL_TABLET | Freq: Every day | ORAL | 3 refills | 35.00 days | Status: AC
Start: 2023-09-03 — End: ?

## 2023-09-03 MED ORDER — JARDIANCE 25 MG PO TABS
ORAL_TABLET | 3 refills | 60.00 days | Status: AC
Start: 2023-09-03 — End: ?

## 2023-09-03 NOTE — Patient Instructions
 Dear Keith Cabrera    Dr. Forrest Iha placed your referral for a sleep test. An authorization to have the study conducted may take up to 2 week to process. If the Sleep Study is approved, [facility] will contact you to schedule an appointment  If you don't hear from Sleep Lab within 3 weeks, please call our office to follow up.     We will review result of your sleep study during your next visit.     [facility]     [phone]      Monitoring Your Sleep: Home Sleep Apnea Study   A home sleep apnea study tracks and records body functions while you?re asleep in your own bed. The results of the study will help diagnose sleep apnea and plan your treatment. Not every person is a candidate for home sleep apnea studies. Your healthcare provider will tell you which type of study is best for you.   How a home sleep apnea study works  During a sleep study, sensors attached to your body measure things, such as your breathing, oxygen level, and other body functions. You will be shown how to attach the sensors to your body. You may also have help from a technician. At bedtime you plug the sensors into a small computer and turn it on. In the morning, you will remove the sensors and return the computer so the results can be studied. A sleep healthcare provider reviews the results and sends the information back to your healthcare provider. Your provider will then discuss the results with you.     Tips  You?ll be given directions for how to set up the sensors and the computer. Doing so will be simple. For best results:   Go through the directions during the day so you?ll be ready to use the equipment at bedtime.  Stick to your normal routine. Ask your healthcare provider if you should do anything differently the night of the study. If you normally use caffeine or alcohol, or take sleep medicine before bed, be sure to let your provider know.  If you get up during the night, reconnect the sensors to the computer or to yourself correctly.  Get as many hours of sleep as you can.  Getting the results  The results of your sleep study need to be scored and interpreted. Once this is done, your healthcare provider will discuss the findings with you. The sleep study results will show whether you have apnea. This is when your breathing stops temporarily many times during the night, awakening you briefly.  It can also tell how severe the apnea is. The findings help your healthcare provider know which treatment or treatments may be the right ones for you.   [employer] last reviewed this educational content on 08/04/2020  ? 2000-2023 [employer], [place]. All rights reserved. This information is not intended as a substitute for professional medical care. Always follow your healthcare professional's instructions.

## 2023-09-03 NOTE — H&P
 PATIENT: Keith Cabrera   MRN: [id number]  DOB: 04/04/1961  DATE OF SERVICE: 09/03/2023  CHIEF COMPLAINT:   Chief Complaint   Patient presents with    Annual Exam      HPI   Keith Cabrera is a 63 y.o. male presents for a complete physical exam      CURRENT SYMPTOMS:    Has been having issues with sleep   He stopped drinking 5 months ago   He wakes up in middle of the night   Goes to bed 930-10 PM and falls asleep in 30 min   He gets up 630 AM  He is waking up 330 or 430 AM and it's hard to fall back asleep.   PMH     Patient Active Problem List   Diagnosis    Type 2 diabetes mellitus without complication (HCC/RAF)    History of coronary artery stent placement    Coronary artery disease involving native coronary artery without angina pectoris    Hypertension associated with diabetes (HCC/RAF)    Mixed hyperlipidemia    History of colonoscopy with polypectomy    Smokes cigars    Hyperlipidemia associated with type 2 diabetes mellitus (HCC/RAF)    Overweight    Bilateral ocular hypertension    History of basal cell carcinoma     Past Medical History:   Diagnosis Date    Basal cell carcinoma     Bilateral ocular hypertension 09/15/2022    Colon polyp     Coronary artery disease     Diabetes mellitus, type 2 (HCC/RAF)     Hyperlipidemia     Hypertension      PSxH     Past Surgical History:   Procedure Laterality Date    COLONOSCOPY N/A 02/21/2016    COLONOSCOPY W/ POLYPECTOMY N/A 07/12/2023    normal TI, x2 tubular adenomas. Repeat in 5 yrs (2030). Performed by Dr. Marjo Sievert    CORONARY STENT PLACEMENT  03/13/2013    SELECTIVE LASER TRABECULOPLASTY Bilateral     SKIN CANCER DESTRUCTION       ALL   No Known Allergies  MEDS     Medications that the patient states to be currently taking   Medication Sig    aspirin 81 mg EC tablet Take 1 tablet (81 mg total) by mouth daily.    Calcium Polycarbophil (FIBER) 625 MG TABS Take 625 mg by mouth daily as needed.    cyanocobalamin 1000 mcg tablet Take 1 tablet (1,000 mcg total) by mouth daily as needed.    nystatin-triamcinolone 100000-0.1 unit/g-% cream APPLY A THIN FILM TO THE AFFECTED AREA BETWEEN BUTTOCKS TWICE DAILY UNTIL FOLLOW UP    Omega-3 Fatty Acids (FISH OIL) 1000 mg capsule Take 1 capsule (1 g total) by mouth daily as needed.    ONETOUCH DELICA FINE lancets USE 1 LANCET TWICE A DAY (Patient taking differently: 1 Lancet by Does not apply route two (2) times daily.)    ONETOUCH ULTRA BLUE test strip USE 1 STRIP TWICE A DAY (Patient taking differently: 1 strip by use with Diabetic testing device route two (2) times daily.)    vitamin E 100 unit capsule Take 1 capsule (45 mg total) by mouth daily as needed.    [DISCONTINUED] ATORVASTATIN 80 mg tablet TAKE 1 TABLET(80 MG) BY MOUTH DAILY    [DISCONTINUED] HYDROCHLOROTHIAZIDE 25 mg tablet TAKE 1 TABLET(25 MG) BY MOUTH DAILY    [DISCONTINUED] JARDIANCE 25 MG tablet TAKE 1 TABLET(25 MG) BY  MOUTH EVERY MORNING    [DISCONTINUED] losartan 100 mg tablet Take 1 tablet (100 mg total) by mouth daily.    [DISCONTINUED] metFORMIN (GLUCOPHAGE XR) 500 mg PO ER 24 hr tablet Take 2 tablets (1,000 mg total) by mouth two (2) times daily.    [DISCONTINUED] OZEMPIC 4 MG/3ML injection pen INJECT 1 MG UNDER THE SKIN ONCE A WEEK     [facility]     Family History   Problem Relation Age of Onset    Alzheimer's disease Mother     Heart disease Father     No Known Problems Sister     Coronary artery disease Brother     No Known Problems Brother     Dementia Maternal Grandfather     No Known Problems Paternal Grandfather      Social History     Tobacco Use    Smoking status: Some Days     Types: Cigars    Smokeless tobacco: Never    Tobacco comments:     occ. cigar   Substance Use Topics    Alcohol use: Yes     Alcohol/week: 1.8 oz     Types: 3 Standard drinks or equivalent per week       Immunizations:  Immunization History   Administered Date(s) Administered    COVID-19 [employer] and up) PF, 30 mcg/0.3 mL 02/12/2023    COVID-19, mRNA, (Moderna) 100 mcg/0.5 mL 07/04/2019, 08/01/2019, 03/19/2020    COVID-19, mRNA, bivalent (Pfizer) 30 mcg/0.3 mL (12y and up) 03/14/2021    Influenza vaccine IM quadrivalent (Afluria Quad) (PF) SYR (23 years of age and older) 12/30/2021    Td 01/09/2017, 01/18/2017, 02/22/2018    Tdap 10/06/2006, 10/06/2006, 10/01/2009    influenza vaccine IM cell culture quadrivalent (Flucelvax Quad) MDV (74 months of age and older) 02/11/2023    influenza vaccine IM quadrivalent (Fluarix Quad) (PF) SYR (7 months of age and older) 12/11/2020    influenza vaccine IM quadrivalent (Fluzone Quad) MDV (57 months of age and older) 02/12/2014    influenza vaccine IM recombinant DNA quadrivalent (Flublok Quad ) (PF) SYR (71+ years of age) 02/12/2017    influenza, unspecified formulation 01/07/2011, 03/19/2015, 02/11/2016, 02/22/2018, 03/28/2019    pneumococcal conjugate vaccine 13-valent (Prevnar) 01/23/2008    pneumococcal conjugate vaccine 20-valent (Prevnar 20) 06/11/2021    pneumococcal polysaccharide vaccine 23-valent (Pneumovax) 01/23/2008    zoster vac recomb adjuvanted (Shingrix) 09/02/2017, 12/03/2017         HEALTH MAINTENANCE     Health Maintenance   Topic Date Due    Diabetes: URINE ALBUMIN-CREATININE RATIO MONITORING  08/06/2023    Diabetes: HGB A1C  02/27/2024    Diabetes: Eye Exam  05/18/2024    Diabetes: eGFR MONITORING  08/26/2024    Preventive Wellness Visit  09/02/2024    Tdap/Td Vaccine (7 - Td or Tdap) 02/23/2028    Colorectal Cancer Screening  07/12/2030    Hepatitis B Screening  Completed    Influenza Vaccine  Completed    Pneumococcal Vaccine  Completed    Hepatitis C Screening  Completed    Shingles (Shingrix) Vaccine  Completed    COVID-19 Vaccine(Tracks primary and booster doses, not sup/immunocomp)  Completed    HIV Screening  Completed    Anti-platelet therapy  Completed    Statin prescribed for ASCVD Prevention or Treatment  Completed        Latest Reference Range & Units 08/27/23 08:06   White Blood Cell Count 4.16 - 9.95  x10E3/uL 8.20   Hemoglobin 13.5 - 17.1 g/dL 16.1   Hematocrit 09.6 - 52.0 % 45.1   Red Blood Cell Count 4.41 - 5.95 x10E6/uL 4.81   Mean Corpuscular Volume 79.3 - 98.6 fL 93.8   Mean Corpuscular Hemoglobin 26.4 - 33.4 pg 31.0   MCH Concentration 31.5 - 35.5 g/dL 04.5   Red Cell Distribution Width-SD 36.9 - 48.3 fL 48.1   Red Cell Distribution Width-CV 11.1 - 15.5 % 14.0   Platelet Count, Auto 143 - 398 x10E3/uL 241   Mean Platelet Volume 9.3 - 13.0 fL 9.9   Nucleated RBC%, automated No Ref. Range % 0.0   Absolute Nucleated RBC Count 0.00 - 0.00 x10E3/uL 0.00   Neutrophil Percent, Auto No Ref. Range % 63.9   Lymphocyte Percent, Auto No Ref. Range % 23.7   Monocyte Percent, Auto No Ref. Range % 8.7   Eosinophil Percent, Auto No Ref. Range % 2.6   Basophil Percent, Auto No Ref. Range % 0.7   Immature Granulocytes% No Reference Range % 0.4   Absolute Neut Count 1.80 - 6.90 x10E3/uL 5.25   Neutrophil Abs (Prelim) See Absolute Neut Ct. x10E3/uL 5.25   Absolute Mono Count 0.20 - 0.80 x10E3/uL 0.71   Absolute Lymphocyte Count 1.30 - 3.40 x10E3/uL 1.94   Absolute Baso Count 0.00 - 0.10 x10E3/uL 0.06   Absolute Eos Count 0.00 - 0.50 x10E3/uL 0.21   Absolute Immature Gran Count 0.00 - 0.04 x10E3/uL 0.03   Sodium 135 - 146 mmol/L 141   Potassium 3.6 - 5.3 mmol/L 4.4   Chloride 96 - 106 mmol/L 103   Total CO2 20 - 30 mmol/L 24   Anion Gap 8 - 19 mmol/L 14   Urea Nitrogen 7 - 22 mg/dL 14   Creatinine 4.09 - 1.30 mg/dL 8.11   Estimated GFR See GFR Additional Information mL/min/1.19m2 >89   GFR Additional Information  See Comment   Glucose 65 - 99 mg/dL 914 (H)   Hemoglobin N8G <5.7 % HbA1c 6.9 (H)   Calcium 8.6 - 10.4 mg/dL 9.5   Cholesterol See Comment mg/dL 97   Cholesterol, HDL >95 mg/dL 36 (L)   Cholesterol,LDL,Calc <100 mg/dL 46   Non-HDL,Chol,Calc <130 mg/dL 61   Triglycerides <621 mg/dL 77   AST (SGOT) 13 - 62 U/L 35   ALT (SGPT) 8 - 70 U/L 30   Alkaline Phosphatase 37 - 133 U/L 54   Bilirubin,Total 0.1 - 1.2 mg/dL 0.6 Albumin 3.9 - 5.0 g/dL 4.4   TOTAL PROTEIN 6.1 - 8.2 g/dL 6.9   TSH 0.3 - 4.7 mcIU/mL 1.5   PSA,Total 0 - 4.5 ng/mL 0.90   (H): Data is abnormally high  (L): Data is abnormally low         PHYSICAL EXAM       BP 120/69  ~ Pulse 65  ~ Temp 36.4 ?C (97.5 ?F) (Tympanic)  ~ Resp 12  ~ Ht 5' 11'' (1.803 m)  ~ Wt 214 lb (97.1 kg)  ~ SpO2 98%  ~ BMI 29.85 kg/m?       Constitutional:  Appearance:  Well nourished, well developed, in no distress  Head:  Normocephalic, atraumatic  Ears:  External ear canals normal, tympanic membranes normal  Neck:  Supple, no lymphadenopathy, no bruits, thyroid gland normal  Respiratory:  Non labored breathing, clear to auscultation, no rales, ronchi or wheezes  Cardiovascular:  Heart:  Regular rhythm, heart sounds normal, no murmer, no gallop  Gastrointestinal:  No tenderness,  no guarding, no hepatosplenomegaly, no masses,                               Normal bowel sounds, no vascular bruits  Genitourinary:  Genitourinary Exam: no hernia, no penile lesions, no testicular mass                                   Lymphatic:  No lymphadenopathy  Musculoskeletal:  No muscle weakness, no muscle tenderness  Skin:  No significant lesions seen, no rash  Neurologic:  Mental status without obvious abnormalities  Psychiatric:  Normal mood, normal affect                           A&P   1. Routine general medical examination at a health care facility (Primary)  - reviewed blood test in detail     2. Other insomnia  - discussed his symptom and offered techniques to help with it   - discussed medication options too but he would like to defer for now     3. Snoring  4. Irregular breathing pattern  We discussed the pathophysiology of obstructive sleep apnea (OSA). We discussed health risks associated with untreated obstructive sleep apnea including hypertension, refractory hypertension, coronary artery disease, heart failure, cardiac dysrhythmia, stroke, insulin resistance as well as the potential to exacerbate underlying mood disorders.  We reviewed treatment options for obstructive sleep apnea including: weight loss, positional therapy (elevate head of bed, maintain sleep in lateral positions), surgery, oral appliance therapy and positive airway pressure (PAP) therapy.  - Sleep Study, Home Sleep Apnea Test (18 years and older); Future    5. Type 2 diabetes mellitus without complication, without long-term current use of insulin (HCC/RAF)  - would like continue with same dose of Ozempic  - he will work on his diet and exercise   - Albumin/Creat Ratio Ur, CLINIC COLLECT TODAY; Future  - empagliflozin (JARDIANCE) 25 mg tablet; TAKE 1 TABLET(25 MG) BY MOUTH EVERY MORNING  Dispense: 90 tablet; Refill: 3  - metFORMIN (GLUCOPHAGE XR) 500 mg PO ER 24 hr tablet; Take 2 tablets (1,000 mg total) by mouth two (2) times daily.  Dispense: 360 tablet; Refill: 3  - semaglutide 1 mg/dose (OZEMPIC) 4 mg/3 mL injection pen; Inject 0.75 mLs (1 mg total) under the skin once a week.  Dispense: 9 mL; Refill: 3  - Hgb A1c; Future    6. Hypertension associated with diabetes (HCC/RAF)  - Sleep Study, Home Sleep Apnea Test (18 years and older); Future  - hydroCHLOROthiazide 25 mg tablet; Take 1 tablet (25 mg total) by mouth daily.  Dispense: 90 tablet; Refill: 3  - losartan 100 mg tablet; Take 1 tablet (100 mg total) by mouth daily.  Dispense: 90 tablet; Refill: 3    7. Hyperlipidemia associated with type 2 diabetes mellitus (HCC/RAF)  - atorvastatin 80 mg tablet; Take 1 tablet (80 mg total) by mouth daily.  Dispense: 90 tablet; Refill: 3  - Comprehensive Metabolic Panel; Future  - Lipid Panel; Future    8. Anxiety  - Routine Therapy Referral      Patient Instructions   Dear Keith Cabrera    Dr. Forrest Iha placed your referral for a sleep test. An authorization to have the study conducted may take up to 2 week to process. If the  Sleep Study is approved, [facility] will contact you to schedule an appointment  If you don't hear from [facility] within 3 weeks, please call our office to follow up.     We will review result of your sleep study during your next visit.     [facility]     [phone]      Monitoring Your Sleep: Home Sleep Apnea Study   A home sleep apnea study tracks and records body functions while you?re asleep in your own bed. The results of the study will help diagnose sleep apnea and plan your treatment. Not every person is a candidate for home sleep apnea studies. Your healthcare provider will tell you which type of study is best for you.   How a home sleep apnea study works  During a sleep study, sensors attached to your body measure things, such as your breathing, oxygen level, and other body functions. You will be shown how to attach the sensors to your body. You may also have help from a technician. At bedtime you plug the sensors into a small computer and turn it on. In the morning, you will remove the sensors and return the computer so the results can be studied. A sleep healthcare provider reviews the results and sends the information back to your healthcare provider. Your provider will then discuss the results with you.     Tips  You?ll be given directions for how to set up the sensors and the computer. Doing so will be simple. For best results:   Go through the directions during the day so you?ll be ready to use the equipment at bedtime.  Stick to your normal routine. Ask your healthcare provider if you should do anything differently the night of the study. If you normally use caffeine or alcohol, or take sleep medicine before bed, be sure to let your provider know.  If you get up during the night, reconnect the sensors to the computer or to yourself correctly.  Get as many hours of sleep as you can.  Getting the results  The results of your sleep study need to be scored and interpreted. Once this is done, your healthcare provider will discuss the findings with you. The sleep study results will show whether you have apnea. This is when your breathing stops temporarily many times during the night, awakening you briefly.  It can also tell how severe the apnea is. The findings help your healthcare provider know which treatment or treatments may be the right ones for you.   [employer] last reviewed this educational content on 08/04/2020  ? 2000-2023 [employer], [place]. All rights reserved. This information is not intended as a substitute for professional medical care. Always follow your healthcare professional's instructions.          The above recommendation were discussed with the patient.  The patient has all questions answered satisfactorily and is in agreement with this recommended plan of care.    Author:  Charolette Copier, MD 8:41 AM    Future Appointments         Provider Department Visit Type    09/27/2023 9:20 AM Portia Brittle., MD [facility] NEW    11/26/2023 9:45 AM Sharmin Foulk, Steve El., MD [facility] [place] RETURN    03/10/2024 9:30 AM Laqueta Plane, MD [facility] RETURN

## 2023-09-08 NOTE — Consults
 SLEEP MEDICINE CONSULTATION     PCP: Charolette Copier., MD  Referring Physician: Laqueta Plane, MD    Chief Sleep Complaint:   No chief complaint on file.    DIAGNOSTIC SLEEP TEST(s):  None on file     ECHOCARDIOGRAM:  Date: 08/13/23 (under Cardiology)  Type: TTE  Findings:   1. Patient declined the administration of DEFINITY on this study.   2. Normal left ventricular size.   3. LV ejection fraction is approximately 60 to 65%. The calculated ejection fraction (Simpson's) is 62 %.   4. Normal LV diastolic function.   5. Trivial aortic regurgitation.   6. Mildly dilated proximal ascending aorta.   7. There are no prior studies on this patient for comparison purposes.  Initial HPI   Date: 09/27/2023  Keith Cabrera is 63 y.o. 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 presents with No chief complaint on file.    Patient saw Dr. Forrest Iha 09/03/23    Occupation: ***  Relevant Familial Medical History: No history of sleep disorders in family    Usual bedtime *** PM, falls asleep within *** minutes.  Once asleep, the patient reports sleep fragmentation *** times/night due to ***; falls back asleep ***  Wake-up time *** AM, out of bed time: ***, total time in bed: *** hours, estimated total sleep time: *** hours  Patient endorses taking *** daytime naps/week.  x*** in length  Patient and/or bed partner []  REPORTS  []  denies snoring  []  REPORTS  []  denies pauses in breathing and irregular breathing pattern during sleep  []  REPORTS  []  denies AM headache.    []  REPORTS  []  denies non-restorative sleep, or daytime fatigue, or trouble focusing and concentrating, or excessive daytime sleepiness which adversely affects work/school performance, relationships, quality of life.  []  REPORTS  []  denies sleepiness with driving.  States understanding to pull aside safely on side of road should patient become sleepy behind wheel.  []  REPORTS  []  denies motor vehicle collisions due to drowsy driving.      STOP BANG: ***/8: []  Snoring, []  Tired, []  Observed Apnea, []  blood Pressure, []  BMI>35, []  Age>50, []  Neck circumference, []  Gender   Epworth Sleepiness Scale (ESS): ***/24  PHQ9: ***/27 1-4 minimal; 5-9 mild; 10-14 moderate; 15-19 moderately severe; 20-27 severe    []  REPORTS  []  denies nicotine use                              []  REPORTS  []  denies alcohol use                             []  REPORTS  []  denies recreational drug use                       []  REPORTS  []  denies caffeine use      []  REPORTS  []  denies supplements                                           []  REPORTS  []  denies sedative/hypnotic medicines                   []   REPORTS  []  denies stimulant medicines             []  REPORTS  []  denies the regular urge to move the legs or irritating feeling in the legs that prevent patient from falling asleep.              []  REPORTS  []  denies regular nightmares.                       []  REPORTS  []  denies current symptoms suggestive of sleep paralysis.                []  REPORTS  []  denies current hypnagogic or hypnopompic hallucinations.                   []  REPORTS  []  denies history of abnormal behaviors during sleep.       Physical Exam   There were no vitals taken for this visit. There is no height or weight on file to calculate BMI.  General: Well-developed male, in no acute distress.   HEENT: Normocephalic and atraumatic. Extraocular movements intact.  Eyes with no exudates or lesions. Bilateral pinna without lesions.  Neck: Supple.   Skin: No facial lesions  Extremities: No clubbing or cyanosis.   Neuro: Appropriately interactive. No focal deficits. Speech fluent.   Psych: Alert and oriented x 3     SLEEP EXAM  NECK SIZE: *** inches  HEENT: Modified Mallampati Class Type: ***  Tonsils Grade: ***  Labs   (click to expand/collapse)    Lab Results   Component Value Date HGB 14.9 08/27/2023      Results for orders placed or performed in visit on 10/26/14   Basic Metabolic Panel   Result Value Ref Range    Sodium 138 135 - 146 mmol/L    Potassium 4.3 3.6 - 5.3 mmol/L    Chloride 100 96 - 106 mmol/L    Total CO2 27 20 - 30 mmol/L    Anion Gap 11 8 - 19    Glucose 141 (H) 65 - 99 mg/dL    GFR Estimate for Non-African American 78 See GFR Additional Information    GFR Estimate for African American >89 See GFR Additional Information    GFR Additional Information      Creatinine 1.0 0.6 - 1.3 mg/dL    Urea Nitrogen 17 7 - 22 mg/dL    Calcium 9.8 8.6 - 81.1 mg/dL      Results for orders placed or performed in visit on 08/27/23   Comprehensive Metabolic Panel   Result Value Ref Range    Sodium 141 135 - 146 mmol/L    Potassium 4.4 3.6 - 5.3 mmol/L    Chloride 103 96 - 106 mmol/L    Total CO2 24 20 - 30 mmol/L    Anion Gap 14 8 - 19 mmol/L    Glucose 120 (H) 65 - 99 mg/dL    Creatinine 9.14 7.82 - 1.30 mg/dL    Estimated GFR >95 See GFR Additional Information mL/min/1.60m2    GFR Additional Information See Comment     Urea Nitrogen 14 7 - 22 mg/dL    Calcium 9.5 8.6 - 62.1 mg/dL    Total Protein 6.9 6.1 - 8.2 g/dL    Albumin 4.4 3.9 - 5.0 g/dL    Bilirubin,Total 0.6 0.1 - 1.2 mg/dL    Alkaline Phosphatase 54 37 - 133 U/L    Aspartate Aminotransferase 35  13 - 62 U/L    Alanine Aminotransferase 30 8 - 70 U/L      Lab Results   Component Value Date    TSH 1.5 08/27/2023      Lab Results   Component Value Date    HGBA1C 6.9 (H) 08/27/2023    No results found for: ''FERRITIN''        Assessment   63 y.o. 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 with ***, suspected to have sleep disordered breathing.  Plan     Suspected sleep disordered breathing (SDB): (Undiagnosed)  Patient with  []  low []  moderate []  high pretest probability: STOP BANG ***/8  Proceed with []  attended polysomnogram (PSG) []  home sleep apnea test (HSAT).   We discussed how the HST is not a sensitive test and hence usually for a negative result, an in-lab PSG is recommended to truly rule out SDB. Patient amenable to proceeding with PSG if HST is non-diagnostic.   We discussed the pathophysiology of obstructive sleep apnea (OSA). We discussed health risks associated with untreated obstructive sleep apnea including but not limited to hypertension, refractory hypertension, coronary artery disease, heart failure, cardiac dysrhythmia, stroke, insulin resistance, cardiovascular morbidity/mortality, dementia, as well as the potential to exacerbate underlying mood disorders.  We discussed how sleep disordered breathing can affect sleep quality.  We reviewed treatment options for obstructive sleep apnea including: weight loss, positional therapy (elevate head of bed, maintain sleep in lateral positions), oral appliance therapy, upper airway cranial nerve XII (hypoglossal) stimulator (Inspire device) treatment, and positive airway pressure (PAP) therapy.  Patient was recommended to try sleeping on side, elevating head and avoiding alcohol at night to improve severity of SDB.  In addition, we discussed that losing 10% of body weight may improve severity of SDB (if BMI>25).   Patient counseled to not operate heavy machinery when sleepy. Patient advised to operate heavy machinery/vehicles only when alert.   If sleep study expresses AHI between 5 and 14, treatment warranted based on patient's comorbidities:  []  excessive daytime sleepiness, []  impaired cognition, []  mood disorder, []  insomnia, []  hypertension, []  ischemic heart disease, []  history of stroke.    Patient given handout outlining timeline and next steps in current sleep evaluation.  Sources of contact provided on handout.    AVS was pulled up and reviewed with patient and specifically counseled to make appointment today to review results in future.    SYMPTOM DOCUMENTATION IN SUPPORT OF SLEEP APNEA DIAGNOSIS:  Evidence of Excessive Daytime Sleepiness  []  Disturbed or restless sleep  []  Non-restorative sleep  []  Frequent unexplained arousals from sleep  []  Fragmented sleep  []  ESS greater than or equal to 10  []  Fatigue  Evidence Suggestive of Sleep Disordered Breathing  []  Habitual loud snoring  []  Witnessed apneas during sleep  []  Choking or gasping during sleep  []  BMI greater than or equal to 30  []  Neck circumference greater than 17 inches (men) or greater than16 inches (women)  []  Sleep related bruxism  []  Cognitive deficits such as inattention or memory  []  Unexplained nocturnal reflux  []  Erectile dysfunction  []  Apneas or hypoxemia during procedures requiring anesthesia  []  Morning headaches    I discussed the plan in detail with the patient who voiced understanding and is in agreement with recommended plan of care. All questions were answered satisfactorily.  FOLLOW UP:  2-3 weeks after sleep study, or earlier if needed    Thank you for the consult.    The cumulative amount of time spent in care of the patient on 09/27/2023 was *** minutes.   *** minutes of face-to-face time was spent with the patient.  Time was spent today discussing the evaluation and management of the above diagnoses with the patient and reviewing available records with the patient. In addition, documentation of clinical information in the EHR or other records was completed along with the following if checked:    []  Review of prior sleep studies (listed above)  []  Review of prior labs, procedures, tests (listed above)  []  Review of prior/external notes                                   []  Review of new data: PAP compliance data (listed above)  []  Documenting clinical information in the EHR  - 15 mins                                        []  Performing a medically appropriate examination and evaluation   []  Ordering medications, laboratory, diagnostic tests or procedures       No orders of the defined types were placed in this encounter.         No diagnosis found.  []  Care Coordination: Calling DME   []  Independently interpreting tests not separately reported  []  Communication with an independent historian  []  Discussion of care with other healthcare professionals in external department    PROBLEM  []  Self-limited/minor: []  1   []  2  []  Chronic stable  []  1   []  2  []  3+  []  Acute, uncomplicated illness or injury []  1   []  2  []  Chronic unstable  []  1   []  2  []  3+  []  Undiagnosed/uncertain prognosis []  1   []  2  []  Acute systemic illness/acute complicated injury  []  1   []  2  []  Chronic severe exacerbation  []  1   []  2  []  Acute or chronic causing threat to life/bodily function  []  1   []  2    REVIEW OF DATA  Category 1:   []  Review of prior sleep studies (listed above) []  1   []  2   []  3+   []  Review of prior labs, procedures, tests (listed above) []  1   []  2   []  3+     []  Review of prior/external notes                                             []  Review of new data: PAP compliance data     []  Ordering laboratory, diagnostic tests or procedures []  1   []  2   []  3+       No orders of the defined types were placed in this encounter.         No diagnosis found.  []  Communication with an independent historian ([id number], [id number])  Category 2:   []  Communication with an independent historian (81191, [id number])  []  Independent interpretation of test not separately reported (47829, 99214, [id number], [id number])  Category 3:   []  Discussion of care with physician in external department [id number], [phone], [id number], [phone])    RISK OF COMPLICATION  []  Minimal     []  Low   []  Moderate   []  High    SOCIAL DETERMINANTS OF HEALTH  []  The diagnosis or treatment of said conditions is significantly limited by social determinants of health    ELECTRONIC PHYSICIAN SIGNATURE:  Cydney Draft, MD  Assistant Clinical Professor  Sleep Medicine  09/08/2023 10:11 AM

## 2023-09-27 ENCOUNTER — Ambulatory Visit: Payer: PRIVATE HEALTH INSURANCE

## 2023-09-29 ENCOUNTER — Ambulatory Visit: Payer: PRIVATE HEALTH INSURANCE

## 2023-09-29 DIAGNOSIS — E119 Type 2 diabetes mellitus without complications: Secondary | ICD-10-CM

## 2023-09-29 DIAGNOSIS — G4733 Obstructive sleep apnea (adult) (pediatric): Secondary | ICD-10-CM

## 2023-09-29 DIAGNOSIS — I2510 Atherosclerotic heart disease of native coronary artery without angina pectoris: Secondary | ICD-10-CM

## 2023-09-29 NOTE — Consults
 SLEEP MEDICINE NOTE    PCP: Montel Karena PARAS., MD        DIAGNOSTIC SLEEP TEST  HSAT  Date: 09/25/23  Diagnosis: Moderate OSA  AHI: 17.9  Supine AHI: 30.7  REM AHI: 12.8  Nadir SpO2: 84%  SpO2<88%: 1.5 min      INTERVAL HISTORY:  Keith Cabrera is 63 y.o. 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 last seen on 5/25, presents today for Sleep Study result follow up.        Past Medical History:   Diagnosis Date    Basal cell carcinoma     Bilateral ocular hypertension 09/15/2022    Colon polyp     Coronary artery disease     Diabetes mellitus, type 2 (HCC/RAF)     Hyperlipidemia     Hypertension          Past Surgical History:   Procedure Laterality Date    COLONOSCOPY N/A 02/21/2016    COLONOSCOPY W/ POLYPECTOMY N/A 07/12/2023    normal TI, x2 tubular adenomas. Repeat in 5 yrs (2030). Performed by Dr. Glendia Led    CORONARY STENT PLACEMENT  03/13/2013    SELECTIVE LASER TRABECULOPLASTY Bilateral     SKIN CANCER DESTRUCTION           Social History     Tobacco Use    Smoking status: Some Days     Types: Cigars    Smokeless tobacco: Never    Tobacco comments:     occ. cigar   Vaping Use    Vaping status: Never Used   Substance Use Topics    Alcohol use: Yes     Alcohol/week: 1.8 oz     Types: 3 Standard drinks or equivalent per week    Drug use: Not Currently     Frequency: 7.0 times per week     Types: Marijuana     Comment: used for years, age 26 reduced to only rare use         Family History   Problem Relation Age of Onset    Alzheimer's disease Mother     Heart disease Father     No Known Problems Sister     Coronary artery disease Brother     No Known Problems Brother     Dementia Maternal Grandfather     No Known Problems Paternal Grandfather          No Known Allergies      No outpatient medications have been marked as taking for the 09/29/23 encounter (Telemedicine) with Montel Karena PARAS., MD.             ASSESSMENT:   63 y.o. 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 followed for management of     1. OSA (obstructive sleep apnea) (Primary)  2. Type 2 diabetes mellitus without complication, without long-term current use of insulin (HCC/RAF)  3. Coronary artery disease involving native coronary artery of native heart without angina pectoris    PLAN:    Sleep disordered breathing:  We discussed the pathophysiology of obstructive sleep apnea (OSA). We discussed health risks associated with untreated obstructive sleep apnea including hypertension, refractory hypertension, coronary artery disease, heart failure, cardiac dysrhythmia, stroke, insulin resistance as well as  the potential to exacerbate underlying mood disorders.  We reviewed treatment options for obstructive sleep apnea including: weight loss, positional therapy (elevate head of bed, maintain sleep in lateral positions), surgery, oral appliance therapy and positive airway pressure (PAP) therapy.  Sleep study was discussed in detail and all the questions were answered   Start PAP therapy. APAP at  6-16 cmH2O  According to patient's diagnosis and sleep study, patient will benefit from PAP therapy  The patient is motivated to start PAP treatment       Sleep hygiene:  We discussed optimal sleep hygiene practices including maintaining a set schedule, consistent light exposure in the morning hours, minimizing alerting activities prior to bedtime, avoiding spending excessive time in bed while awake.   Do not drive if sleepy     Orders Placed This Encounter    DME (Durable Medical Equipment) AMB     Patient Instructions   [facility]  [address]  [facility] [place] [address]  [phone]   [phone]- Fax Continuous Positive Air Pressure (CPAP)  Continuous positive air pressure (CPAP) uses gentle air pressure to hold the airway open. CPAP is often the most effective treatment for sleep apnea. It works very well as a treatment for adults diagnosed with obstructive sleep apnea with a lot of sleepiness. But keep in mind that it can take several adjustments before the setup is right for you.   How CPAP works  The CPAP machine is a small portable pump that sits beside the bed. The pump sends air through a hose. This is held over your nose alone, or nose and mouth by a mask. Mild air pressure is gently pushed through your airway. The air pressure nudges sagging tissues aside. This widens the airway so you can breathe better. CPAP may be combined with other kinds of therapy for sleep apnea.      A mask over the nose gently directs air into the throat to keep the airway open.     Types of air pressure treatments  There are different types of CPAP. Your healthcare provider or CPAP technician will help you decide which type is best for you:   Basic CPAP keeps the pressure constant all night long.  A bilevel device (BiPAP) provides more pressure when you breathe in and less when you breathe out. A BiPAP machine also may be set to provide automatic breaths to maintain breathing if you stop breathing while sleeping.  An auto CPAP device automatically adjusts pressure throughout the night and in response to changes. These include body position, sleep stage, and snoring.  [employer]Well last reviewed this educational content on 07/05/2021  ? 2000-2023 [employer], [place]. All rights reserved. This information is not intended as a substitute for professional medical care. Always follow your healthcare professional's instructions.            I discussed the plan in detail with the patient who voiced understanding and is in agreement.      Follow up: ~2 months, sooner if needed.    Karena DOROTHA Fuchs, MD    Future Appointments Provider Department Visit Type    11/26/2023 9:45 AM Ericca Labra, Karena PARAS., MD [facility] [place] RETURN    03/10/2024 9:30 AM Donivan Begun, MD  [facility] [place] [facility]             Patient Consent to Telehealth Questionnaire        No data to display              -  I agree  to be treated via a video visit and acknowledge that I may be liable for any relevant copays or coinsurance depending on my insurance plan.  - I understand that this video visit is offered for my convenience and I am able to cancel and reschedule for an in-person appointment if I desire.  - I also acknowledge that sensitive medical information may be discussed during this video visit appointment and that it is my responsibility to locate myself in a location that ensures privacy to my own level of comfort.  - I also acknowledge that I should not be participating in a video visit in a way that could cause danger to myself or to those around me (such as driving or walking).  If my provider is concerned about my safety, I understand that they have the right to terminate the visit.

## 2023-09-29 NOTE — Patient Instructions
 [facility]  [address]  [facility] [place] [address]  [phone]   [phone]- Fax     Continuous Positive Air Pressure (CPAP)  Continuous positive air pressure (CPAP) uses gentle air pressure to hold the airway open. CPAP is often the most effective treatment for sleep apnea. It works very well as a treatment for adults diagnosed with obstructive sleep apnea with a lot of sleepiness. But keep in mind that it can take several adjustments before the setup is right for you.   How CPAP works  The CPAP machine is a small portable pump that sits beside the bed. The pump sends air through a hose. This is held over your nose alone, or nose and mouth by a mask. Mild air pressure is gently pushed through your airway. The air pressure nudges sagging tissues aside. This widens the airway so you can breathe better. CPAP may be combined with other kinds of therapy for sleep apnea.      A mask over the nose gently directs air into the throat to keep the airway open.     Types of air pressure treatments  There are different types of CPAP. Your healthcare provider or CPAP technician will help you decide which type is best for you:   Basic CPAP keeps the pressure constant all night long.  A bilevel device (BiPAP) provides more pressure when you breathe in and less when you breathe out. A BiPAP machine also may be set to provide automatic breaths to maintain breathing if you stop breathing while sleeping.  An auto CPAP device automatically adjusts pressure throughout the night and in response to changes. These include body position, sleep stage, and snoring.  StayWell last reviewed this educational content on 07/05/2021  ? 2000-2023 [employer], [place]. All rights reserved. This information is not intended as a substitute for professional medical care. Always follow your healthcare professional's instructions.

## 2023-10-09 ENCOUNTER — Other Ambulatory Visit: Payer: Self-pay | Admitting: Family Medicine

## 2023-10-09 DIAGNOSIS — E7800 Pure hypercholesterolemia, unspecified: Secondary | ICD-10-CM

## 2023-10-12 ENCOUNTER — Other Ambulatory Visit: Payer: Self-pay | Admitting: Family Medicine

## 2023-10-12 DIAGNOSIS — K219 Gastro-esophageal reflux disease without esophagitis: Secondary | ICD-10-CM

## 2023-10-12 DIAGNOSIS — I10 Essential (primary) hypertension: Secondary | ICD-10-CM

## 2023-11-13 MED ORDER — METFORMIN HCL ER 500 MG PO TB24
ORAL_TABLET | 3 refills
Start: 2023-11-13 — End: ?

## 2023-11-13 MED ORDER — EZETIMIBE 10 MG PO TABS
10 mg | ORAL_TABLET | Freq: Every day | ORAL | 3 refills
Start: 2023-11-13 — End: ?

## 2023-11-18 ENCOUNTER — Other Ambulatory Visit: Payer: PRIVATE HEALTH INSURANCE

## 2023-11-18 MED ORDER — METFORMIN HCL ER 500 MG PO TB24
1000 mg | ORAL_TABLET | Freq: Two times a day (BID) | ORAL | 3 refills
Start: 2023-11-18 — End: ?

## 2023-11-23 MED ORDER — METFORMIN HCL ER 500 MG PO TB24
1000 mg | ORAL_TABLET | Freq: Two times a day (BID) | ORAL | 3 refills
Start: 2023-11-23 — End: ?

## 2023-11-23 MED ORDER — METFORMIN HCL ER 500 MG PO TB24
ORAL_TABLET | 3 refills
Start: 2023-11-23 — End: ?

## 2023-11-24 ENCOUNTER — Ambulatory Visit: Payer: No Typology Code available for payment source | Admitting: Neurology

## 2023-11-24 ENCOUNTER — Encounter: Payer: Self-pay | Admitting: Neurology
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
## 2023-11-26 ENCOUNTER — Ambulatory Visit: Payer: BLUE CROSS/BLUE SHIELD

## 2023-11-26 DIAGNOSIS — E119 Type 2 diabetes mellitus without complications: Secondary | ICD-10-CM

## 2023-11-26 DIAGNOSIS — E1159 Type 2 diabetes mellitus with other circulatory complications: Secondary | ICD-10-CM

## 2023-11-26 DIAGNOSIS — I152 Hypertension secondary to endocrine disorders: Secondary | ICD-10-CM

## 2023-11-26 NOTE — Progress Notes
 PROGRESS NOTE    Patient:  Keith Cabrera    Medical record number:  [id number]    Date of birth:  03-12-1961  Date of service:  11/26/2023      Chief complaint:    Chief Complaint   Patient presents with    Follow-up     OSA (obstructive sleep apnea)       History of present illness:           Keith Cabrera is a 63 y.o. male          Who presents with the following history of present illness:        Past medical history:           Patient Active Problem List   Diagnosis    Type 2 diabetes mellitus without complication (HCC/RAF)    History of coronary artery stent placement    Coronary artery disease involving native coronary artery without angina pectoris    Hypertension associated with diabetes (HCC/RAF)    Mixed hyperlipidemia    History of colonoscopy with polypectomy    Smokes cigars    Hyperlipidemia associated with type 2 diabetes mellitus (HCC/RAF)    Overweight    Bilateral ocular hypertension    History of basal cell carcinoma    OSA (obstructive sleep apnea)            Past Medical History:   Diagnosis Date    Basal cell carcinoma     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 Repeat in 5 yrs (2030). Performed by Dr. Glendia Led    CORONARY STENT PLACEMENT  03/13/2013    SELECTIVE LASER TRABECULOPLASTY Bilateral     SKIN CANCER DESTRUCTION         Medications:         Medications that the patient states to be currently taking   Medication Sig    hydroCHLOROthiazide  25 mg tablet Take 1 tablet (25 mg total) by mouth daily.    losartan  100 mg tablet Take 1 tablet (100 mg total) by mouth daily.    semaglutide  1 mg/dose (OZEMPIC ) 4 mg/3 mL injection pen Inject 0.75 mLs (1 mg total) under the skin once a week.       Allergy:       No Known Allergies    Family history:         Family History   Problem Relation Age of Onset    Alzheimer's disease Mother     Heart disease Father     No Known Problems Sister     Coronary artery disease Brother     No Known Problems Brother     Dementia Maternal Grandfather     No Known Problems Paternal Grandfather        Social history:         Social History     Tobacco Use    Smoking status: Some Days     Types: Cigars    Smokeless tobacco: Never    Tobacco comments:     occ.  cigar   Substance Use Topics    Alcohol use: Yes     Alcohol/week: 1.8 oz     Types: 3 Standard drinks or equivalent per week            Latest Reference Range & Units 11/19/23 08:23   Sodium 135 - 146 mmol/L 138   Potassium 3.6 - 5.3 mmol/L 4.3   Chloride 96 - 106 mmol/L 100   Total CO2 20 - 30 mmol/L 22   Anion Gap 8 - 19 mmol/L 16   Urea Nitrogen 7 - 22 mg/dL 14   Creatinine 9.39 - 1.30 mg/dL 9.10   Estimated GFR See GFR Additional Information mL/min/1.36m2 >89   GFR Additional Information  See Comment   Glucose 65 - 99 mg/dL 891 (H)   Hemoglobin J8R <5.7 % HbA1c 6.3 (H)   Calcium  8.6 - 10.4 mg/dL 9.6   Cholesterol See Comment mg/dL 899   Cholesterol, HDL >40 mg/dL 38 (L)   Cholesterol,LDL,Calc <100 mg/dL 43   Non-HDL,Chol,Calc <130 mg/dL 62   Triglycerides <849 mg/dL 94   AST (SGOT) 13 - 62 U/L 28   ALT (SGPT) 8 - 70 U/L 27   Alkaline Phosphatase 37 - 133 U/L 57   Bilirubin,Total 0.1 - 1.2 mg/dL 0.8   Albumin 3.9 - 5.0 g/dL 4.4   TOTAL PROTEIN 6.1 - 8.2 g/dL 7.2   Creatinine,Random Urine No Reference Range mg/dL 853.3   Albumin,urine No Reference Range mg/L <12.0   Albumin/Creat Ratio  SEE COMMENT   (H): Data is abnormally high  (L): Data is abnormally low            PE:         BP 106/68  ~ Pulse 70  ~ Temp 36.3 ?C (97.4 ?F) (Tympanic)  ~ Resp 12  ~ Ht 5' 11'' (1.803 m)  ~ Wt 209 lb (94.8 kg)  ~ SpO2 95%  ~ BMI 29.15 kg/m?           Constitutional:    Appearance:   Well nourished, well developed, in no acute distress  Psychiatric:   Mood normal, affect appropriate    Assessment/ Plan:    1. Type 2 diabetes mellitus without complication, without long-term current use of insulin (HCC/RAF) (Primary)  - impoved  - continue Metformin , Jardiacne and Ozempic    - Hgb A1c; Future    2. Hypertension associated with diabetes (HCC/RAF)  - low BP and dizziness when changes the position   - advised to start taking half of hydrochlorothiazide  and keep log of BP  - Comprehensive Metabolic Panel; Future    3. Hyperlipidemia associated with type 2 diabetes mellitus (HCC/RAF)  - stable   - Lipid Panel; Future        Patient Instructions     Requested Provider: KIMBER BUCK [facility]   Requested Provider Phone: [phone]     Please start taking half of hydrochlorothiazide  (12.5 mg) and keep log of your blood pressure.  The goal is less than 130/80.        The above recommendation were discussed with the patient.  The patient has all questions answered satisfactorily and is in agreement with this recommended plan of care.    Author:       Karena DOROTHA Fuchs, MD  10:03 AM      Future Appointments         Provider Department Visit Type    03/10/2024 9:30 AM Donivan Begun, MD [facility] [place] [facility]

## 2023-11-26 NOTE — Patient Instructions
 Requested Provider: KIMBER BUCK [facility]   Requested Provider Phone: [phone]     Please start taking half of hydrochlorothiazide  (12.5 mg) and keep log of your blood pressure.  The goal is less than 130/80.

## 2023-12-08 ENCOUNTER — Encounter: Payer: Self-pay | Admitting: Neurology

## 2023-12-08 ENCOUNTER — Ambulatory Visit (INDEPENDENT_AMBULATORY_CARE_PROVIDER_SITE_OTHER): Admitting: Neurology

## 2023-12-08 VITALS — BP 112/71 | HR 87 | Ht 72.0 in | Wt 256.0 lb

## 2023-12-08 DIAGNOSIS — G43709 Chronic migraine without aura, not intractable, without status migrainosus: Secondary | ICD-10-CM | POA: Diagnosis not present

## 2023-12-08 DIAGNOSIS — G4733 Obstructive sleep apnea (adult) (pediatric): Secondary | ICD-10-CM

## 2023-12-08 NOTE — Patient Instructions (Signed)
 Great to see you today! Recommend CPAP use nightly minimum 4 hours, try to put the CPAP machine back on if you take it at night, have your wife wake you up if she hears it.  Wear CPAP for naps.  Should be due for a new CPAP machine next year.  Recommend weight loss.  Monitor headaches, call for increase in headache.  Use Fioricet sparingly as risk for rebound headache.  Follow-up in 1 year.  Thanks!!
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
## 2023-12-19 ENCOUNTER — Other Ambulatory Visit: Payer: Self-pay | Admitting: Family Medicine

## 2023-12-19 DIAGNOSIS — F411 Generalized anxiety disorder: Secondary | ICD-10-CM

## 2024-02-01 ENCOUNTER — Other Ambulatory Visit: Payer: Self-pay | Admitting: Family Medicine

## 2024-02-01 DIAGNOSIS — F411 Generalized anxiety disorder: Secondary | ICD-10-CM

## 2024-03-02 ENCOUNTER — Telehealth: Payer: BLUE CROSS/BLUE SHIELD

## 2024-03-02 NOTE — Telephone Encounter
 Patient called regarding atorvastatin  80 mg. Requesting a 3 month supply. Prescription  was given to patient on 09/03/23 for a 90 supply and 3 refills

## 2024-03-10 ENCOUNTER — Ambulatory Visit: Payer: BLUE CROSS/BLUE SHIELD

## 2024-03-10 ENCOUNTER — Ambulatory Visit: Payer: PRIVATE HEALTH INSURANCE

## 2024-03-10 ENCOUNTER — Telehealth: Payer: PRIVATE HEALTH INSURANCE

## 2024-03-10 DIAGNOSIS — I152 Hypertension secondary to endocrine disorders: Secondary | ICD-10-CM

## 2024-03-10 DIAGNOSIS — E1159 Type 2 diabetes mellitus with other circulatory complications: Secondary | ICD-10-CM

## 2024-03-10 MED ORDER — HYDROCHLOROTHIAZIDE 25 MG PO TABS
12.5 mg | ORAL_TABLET | Freq: Every day | ORAL | 3 refills | 90.00000 days | Status: AC
Start: 2024-03-10 — End: ?

## 2024-03-10 NOTE — Telephone Encounter
 Call Back Request    Reason for call back: pt wants to confirm w/ Dr. Donivan that she wants a f/u appt on 08/11/24 and also 09/08/24?    Any Symptoms:  []  Yes  [x]  No      If yes, what symptoms are you experiencing:    Duration of symptoms (how long):    Have you taken medication for symptoms (OTC or Rx):      If call was taken outside of clinic hours:    [] Patient or caller has been notified that this message was sent outside of normal clinic hours.  [] Patient or caller has been warm transferred to the physician's answering service. If applicable, patient or caller informed to please call us  back if symptoms progress.  Patient or caller has been notified of the turnaround time of 1-2 business day(s).

## 2024-03-10 NOTE — Progress Notes
 OUTPATIENT CARDIOVASCULAR CONSULTATION NOTE   PATIENT: Keith Cabrera  MRN: [id number]  DOB: [birth year]  DATE OF SERVICE: 03/10/2024    REFERRING PRACTITIONER: Montel Karena PARAS., MD  PRIMARY CARE PROVIDER: Montel Karena PARAS., MD  REASON FOR CONSULT: CAD    History of Present Illness:   Keith Cabrera is a 64 y.o. male who I am seeing for  evaluation of CAD. Pt is transferring care to [facility]. He has a history of CAD with PCI to LAD in 2010 at [facility]. He states since that time he has been stable. He also has dyslipidemia and hypertension. No other complaints at this time. No chest pain, he does not smoke cigarettes, occasionally smokes cigars.     Interval history 08/12/23  Pt is here for follow up after echo and stress testing. Did well. No complaints. No chest pain or sob.      Interval history 03/10/24  Pt is here for follow up. States he is doing well overall. He has lost weight with ozempic . He continues to occasionally have dizziness. No other complaints at this time.     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 Repeat in 5 yrs (2030). Performed by Dr. 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    atorvastatin  80 mg tablet Take 1 tablet (80 mg total) by mouth daily.    Calcium  Polycarbophil (FIBER) 625 MG TABS Take 625 mg by mouth daily as needed.    cyanocobalamin 1000 mcg tablet Take 1 tablet (1,000 mcg total) by mouth daily as needed.    empagliflozin  (JARDIANCE ) 25 mg tablet TAKE 1 TABLET(25 MG) BY MOUTH EVERY MORNING    ezetimibe  10 mg tablet Take 1 tablet (10 mg total) by mouth daily.    losartan  100 mg tablet Take 1 tablet (100 mg total) by mouth daily.    metFORMIN  (GLUCOPHAGE  XR) 500 mg PO ER 24 hr tablet Take 2 tablets (1,000 mg total) by mouth two (2) times daily.    nystatin-triamcinolone 100000-0.1 unit/g-% cream APPLY A THIN FILM TO THE AFFECTED AREA BETWEEN BUTTOCKS TWICE DAILY UNTIL FOLLOW UP    Omega-3 Fatty Acids (FISH OIL) 1000 mg capsule Take 1 capsule (1 g total) by mouth daily as needed.    ONETOUCH DELICA FINE lancets USE 1 LANCET TWICE A DAY (Patient taking differently: 1 Lancet by Does not apply route two (2) times daily.)    ONETOUCH ULTRA BLUE test strip USE 1 STRIP TWICE A DAY (Patient taking differently: 1 strip by use with Diabetic testing device route two (2) times daily.)    semaglutide  1 mg/dose (OZEMPIC ) 4 mg/3 mL injection pen Inject 0.75 mLs (1 mg total) under the skin once a week.    vitamin E 100 unit capsule Take 1 capsule (45 mg total) by mouth daily as needed.    [DISCONTINUED] hydroCHLOROthiazide  25 mg tablet Take 1 tablet (25 mg total) by mouth daily.    hydroCHLOROthiazide  25 mg tablet Take 0.5 tablets (12.5 mg total) by mouth daily.     No current facility-administered medications for this visit.           Review of Systems:    14 points  comprehensive review of system  was performed and is negative except those mentioned in HPI.    Physical Exam:   VITALS: BP 123/66  ~ Pulse 60  ~ Resp 18  ~ Ht 5' 11'' (1.803 m)  ~ Wt 200 lb (90.7 kg) Comment: verbal wt given. ~ SpO2 97%  ~ BMI 27.89 kg/m?    Body mass index is 27.89 kg/m?SABRA  General: alert, appears stated age, and cooperative  Skin: normal and no rashes  Eyes: conjunctivae/corneas clear. PERRL, EOM's intact.   HEET: atrauamtic with moist mucous membranes  Neck: Normal, JVP is not elevated  Respiratory: respiratory effort is normal, clear to auscultation bilaterally  Heart: regular rate and rhythm, S1, S2 normal, no murmur, click, rub or gallop  Abdomen: soft, non-tender; bowel sounds normal; no masses,  no organomegaly  Extremities: extremities normal, atraumatic, no cyanosis or edema  Pulses: 2+ and symmetric  Neurological/Psychiatric: oriented x 3, normal mood and affect, normal judgment and insight    Laboratory Data:       Lab Results   Component Value Date    NA 138 11/19/2023    K 4.3 11/19/2023    CL 100 11/19/2023    CO2 22 11/19/2023    BUN 14 11/19/2023    CREAT 0.89 11/19/2023    CALCIUM  9.6 11/19/2023     Lab Results   Component Value Date  CHOL 100 11/19/2023    CHOLHDL 38 (L) 11/19/2023    CHOLDLCAL 43 11/19/2023    TRIGLY 94 11/19/2023     @(LASTA1C)@ Lab Results   Component Value Date    WBC 8.20 08/27/2023    HGB 14.9 08/27/2023 HCT 45.1 08/27/2023    MCV 93.8 08/27/2023    PLT 241 08/27/2023     Lab Results   Component Value Date    AST 28 11/19/2023    ALT 27 11/19/2023    ALKPHOS 57 11/19/2023    BILITOT 0.8 11/19/2023    ALBUMIN 4.4 11/19/2023     Lab Results   Component Value Date    TSH 1.5 08/27/2023    HGBA1C 6.3 (H) 11/19/2023        Lab Results   Component Value Date    WBC 8.20 08/27/2023    HGB 14.9 08/27/2023    HCT 45.1 08/27/2023    MCV 93.8 08/27/2023    PLT 241 08/27/2023     Lab Results   Component Value Date    NA 138 11/19/2023    K 4.3 11/19/2023    CL 100 11/19/2023    CO2 22 11/19/2023    BUN 14 11/19/2023    CREAT 0.89 11/19/2023    CALCIUM  9.6 11/19/2023     Lab Results   Component Value Date    AST 28 11/19/2023    ALT 27 11/19/2023    ALKPHOS 57 11/19/2023    BILITOT 0.8 11/19/2023    ALBUMIN 4.4 11/19/2023     Lab Results   Component Value Date    CHOL 100 11/19/2023    CHOLHDL 38 (L) 11/19/2023    CHOLDLCAL 43 11/19/2023    TRIGLY 94 11/19/2023     No results found for: ''PT'', ''INR''  Lab Results   Component Value Date    HGBA1C 6.3 (H) 11/19/2023     Lab Results   Component Value Date    TSH 1.5 08/27/2023     No results found for: ''BNP''   No results found for: ''HSCRP''    No components found for: ''HGA1C''  @(LASTA1C)@  No components found for: ''HA1C''    -1.0% is the estimated 10-year risk of atherosclerotic cardiovascular disease (ASCVD) as of 10:30 AM on 03/10/2024  Values used to calculate ASCVD score:  Age: 63 y.o.    Gender: Male Race: Not African American.  38 mg/dL. (measured on 11/19/2023)  100 mg/dL. Cannot calculate risk because Total cholesterol <130 mg/dL. (measured on 11/19/2023)  123 mm Hg. (measured on 03/10/2024)  Yes  currently a smoker  Yes    Diagnostic/Imaging Studies:     06/25/23  ECG independently reviewed by me:    NSR rate 70    08/13/23  Echo  CONCLUSIONS   1. Patient declined the administration of DEFINITY on this study.   2. Normal left ventricular size.   3. LV ejection fraction is approximately 60 to 65%. The calculated ejection fraction (Simpson's) is 62 %.   4. Normal LV diastolic function.   5. Trivial aortic regurgitation.   6. Mildly dilated proximal ascending aorta.   7. There are no prior studies on this patient for comparison purposes.    08/13/23  Stress test  SUMMARY:   1. Good exercise tolerance.   2. Blood pressure response to stress was normal.   3. Stress test was adequate for inducing target heart rate and/or exercise response.   4. Heart rate response to stress  was adequate (>85% MPHR).   5. Patient's symptoms were equivocal for ischemia.   6. ECG findings are equivocal for ischemia.   7. ECG findings are not suggestive of ischemia.   8. Echocardiogram is not suggestive of ischemia.   9. Consider additional testing (non-invasive coronary CT angiogram or invasive coronary angiogram), if clinically indicated.      Assessment :   Daine Gunther is a 63 y.o. male with:    CAD s/p PCI to LAD in 2010 at [facility]  Dyslipidemia  Hypertension   Diabetes  OSA       Plan:     Previous EKG is consistent NSR rate 70  Echo and stress test ordered for further risk stratification-stable, repeat at next visit  All labs reviewed  Continue aspirin/statin as directed  Continue losartan  100 mg daily, hydrochlorothiazide  12.5 mg daily, wean off as tolerated  DM management per pmd  Recommend low fat/low salt diet and exercise as directed  Use CPAP as directed  Follow up same day as cardiac testing or sooner with any questions or concerns                I spent 46  mins with pt going over history, symptoms, medications and treatment plans     Topics of my discussion are in my note.          Current plan of care was discussed in detail with the patient and all questions were answered.     French Hector MD    Department of Cardiology  [facility]

## 2024-03-14 NOTE — Telephone Encounter
 Left voicemail to confirm appts and to  keep May follow up with Dr. Donivan

## 2024-03-17 ENCOUNTER — Ambulatory Visit: Payer: BLUE CROSS/BLUE SHIELD

## 2024-03-17 DIAGNOSIS — E119 Type 2 diabetes mellitus without complications: Principal | ICD-10-CM

## 2024-03-17 NOTE — Progress Notes
 PROGRESS NOTE    Patient:  Keith Cabrera    Medical record number:  [id number]    Date of birth:  December 17, 1960  Date of service:  03/17/2024      Chief complaint:  No chief complaint on file.      History of present illness:           Keith Cabrera is a 63 y.o. male          Who presents with the following history of present illness:    Her for DM and OSA f/u     Past medical history:           Patient Active Problem List   Diagnosis    Type 2 diabetes mellitus without complication    History of coronary artery stent placement    Coronary artery disease involving native coronary artery without angina pectoris    Hypertension associated with diabetes (HCC/RAF)    Mixed hyperlipidemia    History of colonoscopy with polypectomy    Smokes cigars    Hyperlipidemia associated with type 2 diabetes mellitus (HCC/RAF)    Overweight    Bilateral ocular hypertension    History of basal cell carcinoma    OSA (obstructive sleep apnea)            Past Medical History:   Diagnosis Date    Basal cell carcinoma     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 Repeat in 5 yrs (2030). Performed by Dr. Glendia Led    CORONARY STENT PLACEMENT  03/13/2013    SELECTIVE LASER TRABECULOPLASTY Bilateral     SKIN CANCER DESTRUCTION         Medications:         No outpatient medications have been marked as taking for the 03/17/24 encounter (Office Visit) with Montel Karena PARAS., MD.       Allergy:       No Known Allergies    Family history:         Family History   Problem Relation Age of Onset    Alzheimer's disease Mother     Heart disease Father     No Known Problems Sister     Coronary artery disease Brother     No Known Problems Brother     Dementia Maternal Grandfather     No Known Problems Paternal Grandfather        Social history:         Social History     Tobacco Use    Smoking status: Some Days     Types: Cigars    Smokeless tobacco: Never    Tobacco comments:     occ. cigar   Substance Use Topics    Alcohol use: Yes     Alcohol/week: 1.8 oz     Types: 3 Standard drinks or equivalent per week            Latest Reference Range & Units 03/10/24 08:11   Sodium 135 - 146 mmol/L 140   Potassium 3.6 -  5.3 mmol/L 4.4   Chloride 96 - 106 mmol/L 103   Total CO2 20 - 30 mmol/L 27   Anion Gap 8 - 19 mmol/L 10   Urea Nitrogen 7 - 22 mg/dL 15   Creatinine 9.39 - 1.30 mg/dL 9.17   Estimated GFR See GFR Additional Information mL/min/1.61m2 >89   GFR Additional Information  See Comment   Glucose 65 - 99 mg/dL 92   Hemoglobin J8R <4.2 % HbA1c 6.3 (H)   Calcium  8.6 - 10.4 mg/dL 9.7   Cholesterol See Comment mg/dL 898   Cholesterol, HDL >40 mg/dL 41   Cholesterol,LDL,Calc <100 mg/dL 42   Non-HDL,Chol,Calc <130 mg/dL 60   Triglycerides <849 mg/dL 90   AST (SGOT) 13 - 62 U/L 34   ALT (SGPT) 8 - 70 U/L 32   Alkaline Phosphatase 37 - 133 U/L 58   Bilirubin,Total 0.1 - 1.2 mg/dL 0.6   Albumin 3.9 - 5.0 g/dL 4.5   TOTAL PROTEIN 6.1 - 8.2 g/dL 7.3   (H): Data is abnormally high          Compliance Data:         PE:         BP 128/71  ~ Pulse 67  ~ Temp 36.5 ?C (97.7 ?F) (Tympanic)  ~ Ht 5' 11'' (1.803 m)  ~ Wt 212 lb (96.2 kg)  ~ SpO2 96%  ~ BMI 29.57 kg/m?           Constitutional:    Appearance:   Well nourished, well developed, in no acute distress  Psychiatric:   Mood normal, affect appropriate    Assessment/ Plan:    1. Type 2 diabetes mellitus without complication, without long-term current use of insulin (HCC/RAF) (Primary)  - reviewed blood test in detail  - advised to cut back on low carb diet   - discussed we need to continue Ozempic  at 1 mg for now     2. Hypertension associated with diabetes (HCC/RAF)  - stable with    3. Hyperlipidemia associated with type 2 diabetes mellitus (HCC/RAF)  - stable     4. OSA (obstructive sleep apnea)  Continue with PAP therapy, the patient has acclimated well and is experiencing symptomatic improvement  APAP download data was discussed with patient in detail   Review of data card demonstrates excellent adherence and effective treatment  The patient is motivated to continue PAP treatment         Patient Instructions   You can use Flonase for nasal congestion.     [facility]  [address]  [place] [address]  [phone]       The above recommendation were discussed with the patient.  The patient has all questions answered satisfactorily and is in agreement with this recommended plan of care.    Author:       Karena DOROTHA Fuchs, MD  8:40 AM      Future Appointments         Provider Department Visit Type    08/11/2024 9:00 AM [place] [facility]READ[facility]LL 01; [place] ECHO 01 [facility] TRANSTHORACIC ECHO ADULT COMPL    08/11/2024 10:00 AM [place] [facility] 01; [place] ECHO 01 [facility] ADULT STRESS ECHO TREADMILL    08/11/2024 11:15 AM Donivan Begun, MD [facility] [place] RETURN

## 2024-03-17 NOTE — Patient Instructions
 You can use Flonase for nasal congestion.     [facility]  [address]  [place] [address]  [phone]

## 2024-03-31 MED ORDER — LOSARTAN POTASSIUM 100 MG PO TABS
100 mg | ORAL_TABLET | Freq: Every day | ORAL | 3 refills | 90.00000 days | Status: AC
Start: 2024-03-31 — End: ?

## 2024-04-20 MED ORDER — OZEMPIC (1 MG/DOSE) 4 MG/3ML SC SOPN
SUBCUTANEOUS | 3 refills | 28.00000 days | Status: AC
Start: 2024-04-20 — End: ?

## 2024-12-07 ENCOUNTER — Ambulatory Visit: Admitting: Neurology
